# Patient Record
Sex: Female | Born: 1955
Health system: Southern US, Community
[De-identification: ages and names within clinical notes are randomized; demographics above are authoritative.]

## PROBLEM LIST (undated history)

## (undated) DIAGNOSIS — F32A Depression, unspecified: Secondary | ICD-10-CM

## (undated) DIAGNOSIS — G4733 Obstructive sleep apnea (adult) (pediatric): Secondary | ICD-10-CM

## (undated) DIAGNOSIS — F329 Major depressive disorder, single episode, unspecified: Secondary | ICD-10-CM

## (undated) DIAGNOSIS — I10 Essential (primary) hypertension: Secondary | ICD-10-CM

## (undated) DIAGNOSIS — Z9989 Dependence on other enabling machines and devices: Secondary | ICD-10-CM

## (undated) HISTORY — PX: JOINT REPLACEMENT: SHX530

## (undated) HISTORY — PX: SHOULDER SURGERY: SHX246

## (undated) HISTORY — DX: Major depressive disorder, single episode, unspecified: F32.9

## (undated) HISTORY — DX: Depression, unspecified: F32.A

## (undated) HISTORY — PX: TONSILLECTOMY: SUR1361

## (undated) HISTORY — DX: Obstructive sleep apnea (adult) (pediatric): G47.33

## (undated) HISTORY — DX: Dependence on other enabling machines and devices: Z99.89

---

## 1997-05-16 ENCOUNTER — Other Ambulatory Visit: Admission: RE | Admit: 1997-05-16 | Discharge: 1997-05-16 | Payer: Self-pay | Admitting: Obstetrics & Gynecology

## 1997-09-12 ENCOUNTER — Inpatient Hospital Stay (HOSPITAL_COMMUNITY): Admission: AD | Admit: 1997-09-12 | Discharge: 1997-09-12 | Payer: Self-pay | Admitting: Obstetrics and Gynecology

## 1997-10-14 ENCOUNTER — Other Ambulatory Visit: Admission: RE | Admit: 1997-10-14 | Discharge: 1997-10-14 | Payer: Self-pay | Admitting: Obstetrics & Gynecology

## 1998-01-18 HISTORY — PX: HYSTERECTOMY ABDOMINAL WITH SALPINGO-OOPHORECTOMY: SHX6792

## 1999-02-03 ENCOUNTER — Other Ambulatory Visit: Admission: RE | Admit: 1999-02-03 | Discharge: 1999-02-03 | Payer: Self-pay | Admitting: Obstetrics & Gynecology

## 2001-02-15 ENCOUNTER — Other Ambulatory Visit: Admission: RE | Admit: 2001-02-15 | Discharge: 2001-02-15 | Payer: Self-pay | Admitting: Obstetrics & Gynecology

## 2001-07-06 ENCOUNTER — Ambulatory Visit (HOSPITAL_COMMUNITY): Admission: RE | Admit: 2001-07-06 | Discharge: 2001-07-06 | Payer: Self-pay | Admitting: Obstetrics & Gynecology

## 2001-07-06 ENCOUNTER — Encounter (INDEPENDENT_AMBULATORY_CARE_PROVIDER_SITE_OTHER): Payer: Self-pay | Admitting: Specialist

## 2002-07-28 ENCOUNTER — Emergency Department (HOSPITAL_COMMUNITY): Admission: EM | Admit: 2002-07-28 | Discharge: 2002-07-28 | Payer: Self-pay | Admitting: Emergency Medicine

## 2002-10-18 ENCOUNTER — Other Ambulatory Visit: Admission: RE | Admit: 2002-10-18 | Discharge: 2002-10-18 | Payer: Self-pay | Admitting: Obstetrics & Gynecology

## 2003-04-25 ENCOUNTER — Encounter (INDEPENDENT_AMBULATORY_CARE_PROVIDER_SITE_OTHER): Payer: Self-pay | Admitting: Specialist

## 2003-04-26 ENCOUNTER — Inpatient Hospital Stay (HOSPITAL_COMMUNITY): Admission: RE | Admit: 2003-04-26 | Discharge: 2003-04-27 | Payer: Self-pay | Admitting: Obstetrics & Gynecology

## 2005-01-18 ENCOUNTER — Emergency Department (HOSPITAL_COMMUNITY): Admission: EM | Admit: 2005-01-18 | Discharge: 2005-01-19 | Payer: Self-pay | Admitting: Emergency Medicine

## 2005-04-13 ENCOUNTER — Emergency Department (HOSPITAL_COMMUNITY): Admission: EM | Admit: 2005-04-13 | Discharge: 2005-04-13 | Payer: Self-pay | Admitting: Emergency Medicine

## 2005-05-25 ENCOUNTER — Emergency Department (HOSPITAL_COMMUNITY): Admission: EM | Admit: 2005-05-25 | Discharge: 2005-05-25 | Payer: Self-pay | Admitting: Family Medicine

## 2005-10-26 ENCOUNTER — Emergency Department (HOSPITAL_COMMUNITY): Admission: EM | Admit: 2005-10-26 | Discharge: 2005-10-26 | Payer: Self-pay | Admitting: Emergency Medicine

## 2007-11-02 ENCOUNTER — Observation Stay (HOSPITAL_COMMUNITY): Admission: EM | Admit: 2007-11-02 | Discharge: 2007-11-03 | Payer: Self-pay | Admitting: Emergency Medicine

## 2010-06-02 NOTE — Discharge Summary (Signed)
April Stevens, April Stevens                  ACCOUNT NO.:  [id number]   MEDICAL RECORD NO.:  [id number]          PATIENT TYPE:  OBV   LOCATION:  3733                         FACILITY:  [facility]   PHYSICIAN:  Altha Harm, MDDATE OF BIRTH:  08/07/1955   DATE OF ADMISSION:  11/02/2007  DATE OF DISCHARGE:  11/03/2007                               DISCHARGE SUMMARY   DISCHARGE DISPOSITION:  Home.   FINAL DISCHARGE DIAGNOSIS:  1. Chest pain noncardiac.  2. Hyperlipidemia.  3. Hypertension well controlled.  4. History of depression.  5. Chronic pain.   DISCHARGE MEDICATIONS:  1. Lipitor 20 mg p.o. daily.  2. Amlodipine 5 mg p.o. daily.  3. Hydrochlorothiazide 25 mg p.o. daily.  4. Fluoxetine  20 mg p.o. daily.  5. Cyclobenzaprine 10 mg p.o. q.8 hours p.r.n.  6. Etodolac 400 mg p.o. q.12 hours p.r.n.  7. Aspirin 81 mg p.o. daily.   CONSULTANTS:  Cardiology.   PROCEDURES:  None.   DIAGNOSTIC STUDIES:  1. Cardiolite stress test which was negative for any irreversible wall      motion abnormalities.  2. Chest x-ray 2 view which showed minimal bronchitic changes.   CODE STATUS:  Full code.   ALLERGIES:  No known drug allergies.   CHIEF COMPLAINT:  Chest pain and numbness of the left upper extremity.   HISTORY OF PRESENT ILLNESS:  Please refer to the H and P dictated by Dr.  Harlin Heys for details of the H and P.   HOSPITAL COURSE:  The patient was admitted and serial enzymes performed  at rest.   These were all negative and the patient underwent a stress test which  was negative for any irreversible wall motion abnormalities.  The  patient was pain-free during her hospitalization.   At the time of discharge vital signs are stable.  Heart rate is 77,  blood pressure 121/73, temperature 97.7, respiratory rate 18, 02  saturations 99% on room air.  Sodium 144, potassium 3.2, chloride 107,  bicarb 30, BUN 13, creatinine 0.97, total cholesterol 190, triglycerides  251.  Lungs were  clear to auscultation.  Cardiovascular; she is normal  sinus rhythm on the monitor.  Normal S1 and S2.  No murmurs, rubs or  gallops noted.  Abdomen is obese, soft, nontender, not distended.  No  masses, no hepatosplenomegaly.  Extremities showed she had no clubbing,  cyanosis or edema.   FOLLOWUP:  The patient is to followup with Dr. Girard Cooter in [facility] in one week.   DIETARY RESTRICTIONS:  She should be on a low sodium, low cholesterol  diet.      Altha Harm, MD  Electronically Signed     MAM/MEDQ  D:  11/03/2007  T:  11/03/2007  Job:  [id number]

## 2010-06-02 NOTE — H&P (Signed)
April Stevens, FEAR NO.:  [id number]   MEDICAL RECORD NO.:  [id number]          PATIENT TYPE:  OBV   LOCATION:  3733                         FACILITY:  [facility]   PHYSICIAN:  Hillery Aldo, M.D.   DATE OF BIRTH:  1955-10-04   DATE OF ADMISSION:  11/02/2007  DATE OF DISCHARGE:                              HISTORY & PHYSICAL   PRIMARY CARE PHYSICIAN:  The patient sees multiple physicians at  [facility].   CHIEF COMPLAINT:  Chest pain, numbness of the left upper extremity.   HISTORY OF PRESENT ILLNESS:  The patient is a 55 year old female who  comes in with a chief complaint of chest pain of 2 days' duration that  was nonexertional when it began.  She describes it as sharp,  intermittent, and lasted only a few seconds at a time.  Pain is  occasionally associated with dyspnea and left arm numbness.  She denies  any associated fever, chills, diaphoresis, nausea, vomiting, or cough.  She had a similar episode several weeks ago.  The patient has a few  cardiac risk factors including hypertension, dyslipidemia, and  postmenopausal status as well as mild obesity and history of tobacco  abuse in the past and therefore is being admitted for acute coronary  syndrome rule out and to obtain an exercise stress test.   PAST MEDICAL HISTORY:  1. Dysmenorrhea/menorrhagia, status post hysterectomy approximately 5      years ago with bilateral salpingo-oophorectomy and lysis of      adhesions.  2. Hypertension.  3. Dyslipidemia.  4. Obstructive sleep apnea on nocturnal CPAP.  5. History of cold knife conization of cervix for CIN II.  6. History of bilateral tubal ligation prior to hysterectomy.  7. Depression.  8. History of panic attacks.   FAMILY HISTORY:  The patient's father died at 77.  He had a heart attack  at age 32 and ultimately died of heart failure at 52.  The patient is  estranged from her mother.  She has 4 sisters.  All of the siblings have  hypertension and dyslipidemia.  One sister has breast cancer.   SOCIAL HISTORY:  The patient is divorced and currently lives alone.  She  quit smoking 11 years ago, but prior to that had a heavy history of 1-  1/2 packs to 2 packs per day.  She occasionally drinks a glass of wine.  She denies any drug use.  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   ALLERGIES:  None.   CURRENT MEDICATIONS:  1. Lipitor 20 mg daily.  2. Amlodipine 5 mg daily.  3. Hydrochlorothiazide 25 mg daily.  4. Fluoxetine 20 mg daily.  5. Cyclobenzaprine 10 mg q.8 h. P.r.n.  6. Etodolac 400 mg q.12 h. p.r.n.   REVIEW OF SYSTEMS:  The patient endorses an intentional weight loss of  over 20 pounds.  She denies diarrhea.  She denies melena, hematochezia,  and dysuria.  A comprehensive 14-point review of systems is otherwise as  noted in the elements of the  HPI above.   PHYSICAL EXAM:  VITAL SIGNS:  Temperature 98.0, pulse 77, respirations  20, blood pressure 156/96, and O2 saturation 98% on room air.  GENERAL:  Mildly obese female in no acute distress.  HEENT:  Normocephalic, atraumatic.  PERRL.  EOMI.  Oropharynx is clear.  NECK:  Supple, no thyromegaly, no lymphadenopathy, no jugular venous  distention.  CHEST:  Lungs clear to auscultation bilaterally with good air movement.  HEART:  Regular rate, rhythm.  No murmurs, rubs, or gallops.  Palpation  of her anterior chest wall does cause reproducible chest pain in the  left ribcage area anteriorly.  ABDOMEN:  Soft, nontender, nondistended with normoactive bowel sounds.  EXTREMITIES:  No clubbing, edema, or cyanosis.  SKIN:  Warm and dry.  No rashes.  NEUROLOGIC:  The patient is alert and oriented x3.  Nonfocal exam.  Subjective paresthesias in the left upper extremity.   Data review.   Chest x-ray shows minimal bronchitic changes.   A 12-lead EKG shows normal sinus rhythm with prolonged QTc of 485  milliseconds, but is  otherwise without ST or T-wave abnormalities.   LABORATORY DATA:  Myoglobin is 160, CK-MB 9.2, troponin I less than  0.05.  White blood cell count is 8.4, hemoglobin 12.9, hematocrit 37.9,  platelets 385.  Sodium is 144, potassium 3.0, chloride 104, bicarb 29,  BUN 13, creatinine 1.0, glucose 91.   ASSESSMENT AND PLAN:  1. Atypical chest pain in a patient with several cardiac risk factors:      We will admit the patient for observation and cycle cardiac enzymes      q.6 h. x3 sets.  We will perform exercise stress testing in the      morning.  We will start her on low-dose aspirin therapy and      administer nitroglycerin if needed for recurrent chest pain along      with morphine if needed.  We will empirically put her on proton      pump inhibitor and monitor on the telemetry unit.  We will also      check a D-dimer to rule out pulmonary embolism and if this is      elevated, consider a CT angiogram.  2. Hypokalemia:  Likely secondary to renal losses from diuretic      therapy.  We will replete.  3. Hypertension:  Continue the patient's Norvasc and      hydrochlorothiazide.  Her blood pressure is slightly elevated here      in the emergency department and if the trend continues, we would      increase her Norvasc to 10 mg daily.  4. Dyslipidemia:  We will check the patient's fasting lipid panel in      the morning to ensure she is well controlled      on her current dose of Lipitor.  5. Prophylaxis:  Administer subcutaneous heparin for deep venous      thrombosis prophylaxis and place the patient on proton pump      inhibitor therapy for gastrointestinal stress ulcer prophylaxis.      Hillery Aldo, M.D.  Electronically Signed     CR/MEDQ  D:  11/02/2007  T:  11/02/2007  Job:  [id number]   cc:   Holley [place]

## 2010-06-05 NOTE — Op Note (Signed)
[facility]  Patient:    April Stevens, April Stevens Visit Number: [id number] MRN: [id number]          Service Type: DSU Location: [facility] Attending Physician:  Minette Headland Dictated by:   Freddy Finner, M.D. Proc. Date: 07/06/01 Admit Date:  07/06/2001                             Operative Report  PREOPERATIVE DIAGNOSES:       1. Endometrial wall thickening anteriorly on                                  sonohysterogram in office.                               2. Clinical symptoms of menometrorrhagia,                                  uterine enlargement.                               3. Ultrasound evidence of one single fibroid                                  and findings consistent with adenomyosis.  PREOPERATIVE DIAGNOSES:       1. Endometrial wall thickening anteriorly on                                  sonohysterogram in office.                               2. Clinical symptoms of menometrorrhagia,                                  uterine enlargement.                               3. Ultrasound evidence of one single fibroid                                  and findings consistent with adenomyosis.                               4. Finding of endometrial polyp.  PROCEDURE:                    Hysteroscopy dilation and curettage and resection and endometrial polyp.  SURGEON:                      Freddy Finner, M.D.  ANESTHESIA:                   General.  ESTIMATED BLOOD LOSS:         10  cc or less.  SORBITOL DEFICIT:             30 cc.  INTRAOPERATIVE COMPLICATIONS: None.  INDICATIONS FOR PROCEDURE:    The patient is a 55 year old with menometrorrhagia who had sonohysterogram on June 26, 2001 in the office with a finding of an endometrial thickening anteriorly and a 19 mm intramural leiomyoma. She is admitted now for hysteroscopy D&C.  DESCRIPTION OF PROCEDURE:  She was admitted on the morning of surgery. She was brought to the operating  room, there placed under adequate general anesthesia, placed in the dorsal lithotomy position using Allen stirrup system. A Betadine prep was carried out in the usual fashion. A bivalve speculum was introduced. The cervix was grasped with a single tooth tenaculum and progressively dilated with Pratts to 23. The 12 1/2 degree [facility] hysteroscope was introduced using 3% sorbitol as the distending medium. Photographs were made of intracavitary findings, specifically a polyp on the anterior fundal surface of the endometrium. Thorough curettage and exploration with polyp forceps was carried out followed by repeat inspection which confirmed sampling of the endometrium and removal of the polyp. At this point, the procedure was terminated, the instruments removed, the patient was awakened and taken to recovery in good condition. Dictated by:   Freddy Finner, M.D. Attending Physician:  Minette Headland DD:  07/06/01 TD:  07/06/01 Job: [id number]

## 2010-06-05 NOTE — Discharge Summary (Signed)
NAMEELMARIE, April Stevens                            ACCOUNT NO.:  [id number]   MEDICAL RECORD NO.:  [id number]                   PATIENT TYPE:  INP   LOCATION:  9302                                 [facility]ACILITY[facility]  [facility]   PHYSICIAN:  Freddy Finner, M.D.                DATE OF BIRTH:  May 09, 1955   DATE OF ADMISSION:  04/25/2003  DATE OF DISCHARGE:  04/27/2003                                 DISCHARGE SUMMARY   DISCHARGE DIAGNOSES:  1. Uterine leiomyomata.  2. Pelvic adhesions.  3. History of chronic dysmenorrhea and menorrhagia.   OPERATIVE PROCEDURE:  Laparoscopic-assisted vaginal hysterectomy, bilateral  salpingo-oophorectomy, lysis of pelvic adhesions.   INTRAOPERATIVE COMPLICATIONS:  None.   POSTOPERATIVE COMPLICATIONS:  Slow return of GI function without other  complications.   Physical findings on admission were primarily remarkable for the pelvic  findings with enlargement of the uterus consistent with leiomyomata.   Laboratory data on admission initially included a CBC on admission with  hemoglobin of 11.5, postoperative hemoglobin of 10.6.  Prothrombin time and  PTT were normal on admission.   HOSPITAL COURSE:  The patient was admitted on the morning of surgery.  The  above-described surgical procedure was accomplished without intraoperative  complications.  Postoperatively the patient's course was satisfactory but on  the day following surgery she was having a difficult time tolerating oral  intake with persistent nausea.  It was elected to continue her admission for  another day which allowed adequate progression of diet and by the time of  her discharge on postoperative day #2 she was tolerating a regular diet,  ambulating without difficulty, having adequate bowel and bladder function.  Her discharge medications are Vivelle 0.1 dot and Percocet as needed for  pain.  She is to return to the office in 2 weeks for postoperative follow-  up.               W. Varney Baas, M.D.    WRN/MEDQ  D:  05/24/2003  T:  05/24/2003  Job:  [id number]

## 2010-06-05 NOTE — Op Note (Signed)
NAMESHRESTA, RISDEN                            ACCOUNT NO.:  [id number]   MEDICAL RECORD NO.:  [id number]                   PATIENT TYPE:  OBV   LOCATION:  9399                                 [facility]ACILITY[facility]  [facility]   PHYSICIAN:  Freddy Finner, M.D.                DATE OF BIRTH:  [birth year]   DATE OF PROCEDURE:  04/25/2003  DATE OF DISCHARGE:                                 OPERATIVE REPORT   PREOPERATIVE DIAGNOSES:  1. Uterine enlargement.  2. Uterine fibroids.  3. Complex right adnexal cyst suggestive of endometrioma.  4. History of severe dysmenorrhea/menometrorrhagia.   POSTOPERATIVE DIAGNOSES:  1. Uterine enlargement.  2. Uterine fibroids.  3. Complex right adnexal cyst suggestive of endometrioma.  4. History of severe dysmenorrhea/menometrorrhagia.  5. Benign-appearing right ovarian cyst and pelvic adhesions of the left     adnexa.   OPERATIVE PROCEDURE:  Laparoscopically assisted vaginal hysterectomy,  bilateral salpingo-oophorectomy, lysis of pelvic adhesions.   ESTIMATED INTRAOPERATIVE BLOOD LOSS:  100 mL.   ANESTHESIA:  General.   INTRAOPERATIVE COMPLICATIONS:  None.   SURGEON:  Freddy Finner, M.D.   ASSISTANT:  Dineen Kid. Rana Snare, M.D.   Details of the present illness are recorded in the admission note.  Patient  was admitted on the morning of surgery, she received an antibiotic IV  preoperatively, she was placed in PAS hose, she was brought to the operating  room and there placed under adequate general anesthesia and placed in the  dorsal lithotomy position.  Prep of the abdomen, perineum, and vagina was  carried out with Betadine scrub followed by Betadine solution.  Bladder was  evacuated using the [facility] catheter.  Hook tenaculum was attached to the  cervix without difficulty.  Sterile drapes were applied.  Two small  incisions were made, one at the umbilicus through an old scar and one just  above the symphysis in the midline.  An 11 mm 9-blade disposable trocar  was  introduced in the umbilicus while elevating the anterior abdominal wall  manually, direct inspection revealed adequate placement with no evidence of  injury on entry, pneumoperitoneum was allowed to accumulate with carbon  dioxide gas, a 5 mm trocar was placed in the lower incision under direct  visualization, scanning inspection of the upper abdomen revealed no apparent  abnormalities of liver, of appendix, no upper abdominal adhesions or  apparent abnormalities.  Pelvic findings are noted as postoperative  diagnoses and recorded in still photographs which are taped in the office  record.  Through the lower trocar sleeve spring-loaded grasping forceps was  placed.  The right infundibular caudate ligament was addressed first and the  gyrus tripolar device was used to seal and divide that pedicle.  There was  some bleeding initially from this which was controlled with the bipolar.  This required additional attention because a small hematoma formed and  seemed to be extending.  After several different applications of the cautery  device the hematoma stabilized and remained stable throughout the case  including at inspection immediately before completing the case.  The round  ligament, upper broad ligament on this side were taken with the tripolar  gyrus device down to a level just above the uterine arteries.  On the left  side there were some paraovarian adhesions and an adhesion of the sigmoid  epiploica to the left fallopian tube just at the isthmus.  This adhesion was  taken down with the gyrus device which was sealed  and divided.  Blunt  dissection was required to elevate the ovary and free it on the left side.  The infundibular pelvic ligament, round ligament and upper broad ligament  were then controlled similarly to the right side with sealing and division  using the gyrus device, this too was carried down to a level just above the  uterine artery.  Attention was then turned  vaginally, a posterior weighted  retractor was placed, __________ were used anteriorly and laterally for  vaginal retraction.  Hook tenaculum was removed and Jacobs tenaculum  applied, posterior colpotomy incision was made while pinning the mucosa  posterior to the cervix with an Allis, cervix was circumscribed with a  scalpel, the ligature system was then used to seal and divide the  uterosacral pedicles, the bladder pillars and the lower cardinal ligaments.  The bladder was carefully dissected off the cervix and lower segment and  anterior peritoneum entered.  The upper cardinal ligament was taken and  sealed with the LigaSure system and divided.  The vessels were taken,  sealed, and divided.  A small pedicle above each vessel was taken.  This  effectively freed the uterus and it was delivered through the vaginal  introitus.  __________ of the vagina were then attached to the uterosacral  ligaments with a mattress suture 0 Monocryl.  Uterosacral's were plicated,  posterior peritoneum closed with an interrupted 0 Monocryl suture.  Cuff was  closed vertically with figure of eights and Monocryl.  Foley was placed.  Reinspection with the laparoscope abdominally using __________ irrigation  system was carried out.  Hemostasis was completed with a stable small  hematoma at the right infundibular pelvic ligament.  The irrigating solution  was aspirated from the abdomen, instruments were removed, skin incisions  were closed with interrupted subcuticular sutures and 3-0 Dexon, Steri-  Strips were applied in the lower incision, the patient was awakened and  taken to recovery in good condition.                                               Freddy Finner, M.D.    WRN/MEDQ  D:  04/25/2003  T:  04/25/2003  Job:  [id number]

## 2010-06-05 NOTE — H&P (Signed)
NAME:  April Stevens, April Stevens                            ACCOUNT NO.:  [id number]   MEDICAL RECORD NO.:  [id number]                   PATIENT TYPE:  OBV   LOCATION:  NA                                   FACILITY[facility]   PHYSICIAN:  Freddy Finner, M.D.                DATE OF BIRTH:  10-04-1955   DATE OF ADMISSION:  04/25/2003  DATE OF DISCHARGE:                                HISTORY & PHYSICAL   ADMISSION DIAGNOSES:  Uterine fibroids, complex right ovarian cyst most  consistent with endometrioma, and probable adenomyosis.   HISTORY OF PRESENT ILLNESS:  The patient is a 55 year old gravida 2, para 2  with several years history of menometrorrhagia, known uterine enlargement  with known uterine leiomyomata. She has had significant dysmenorrhea and  pelvic pain over the last 3 to 4 months since the complex right ovarian cyst  was first noted. The cyst and fibroids have been confirmed by pelvic  ultrasound, the most recent on April 04, 2003. She is now admitted for  laparoscopically assisted vaginal hysterectomy and bilateral salpingo-  oophorectomy.   PAST MEDICAL HISTORY:  The patient has no known significant medical  illnesses except for episodes of hypertension noted at our office, which she  has failed to follow up with her internist.   MEDICATIONS:  She is currently on no medications on a chronic basis except  Darvocet for pain.   PAST SURGICAL HISTORY:  Cold knife conization of the cervix for CIN-II in  1983, __________. She also had tubal sterilization in 1997. She had  hysteroscopy D&C for endometrorrhagia in 2003.   ALLERGIES:  No known drug allergies.   SOCIAL HISTORY:  She is not a cigarette smoker.   LABORATORY DATA:  Mammogram in September of 2004 was normal. Recent pap  smear in September of 2004 was normal.   REVIEW OF SYSTEMS:  Negative except for GU complaints. No cardiopulmonary,  GI or other GU symptoms.   FAMILY HISTORY:  Noncontributory.   PHYSICAL EXAMINATION:   HEENT:  Grossly within normal limits.  VITAL SIGNS:  Blood pressure in the office was 170/84.  NECK:  Thyroid gland is not palpable or enlarged.  CHEST:  Clear to auscultation throughout.  HEART:  Normal sinus rhythm. Without murmur, rub, or gallop.  ABDOMEN:  Soft and nontender without appreciable organomegaly or palpable  masses.  BREAST:  Examination is normal. No skin change. No nipple discharge. No  palpable masses.  PELVIC:  External genitalia and cervix are normal. Bimanual reveals the  uterus to be approximately 8 weeks size by palpation. There is tenderness in  the right adnexa. The mass is difficult to palpate but is confirmed by  ultrasound. Left adnexa is clear. Rectovaginal examination confirms the  above findings.  EXTREMITIES:  Without clubbing, cyanosis, or edema.   ASSESSMENT:  Uterine leiomyomata. Suspected adenomyosis. History of  menometrorrhagia. Failure to respond to  conservative surgery. Complex right  adnexal mass.   PLAN:  Laparoscopically assisted vaginal hysterectomy and bilateral salpingo-  oophorectomy.                                               Freddy Finner, M.D.    WRN/MEDQ  D:  04/24/2003  T:  04/24/2003  Job:  [id number]

## 2010-10-20 LAB — LIPID PANEL
LDL Cholesterol: 102 — ABNORMAL HIGH
Total CHOL/HDL Ratio: 5
Triglycerides: 251 — ABNORMAL HIGH
VLDL: 50 — ABNORMAL HIGH

## 2010-10-20 LAB — POCT CARDIAC MARKERS
CKMB, poc: 6.2
CKMB, poc: 9.2
Myoglobin, poc: 163
Troponin i, poc: <0.05
Troponin i, poc: <0.05

## 2010-10-20 LAB — BASIC METABOLIC PANEL
CO2: 30
Calcium: 8.8
Chloride: 107
GFR calc Af Amer: >60
Glucose, Bld: 102 — ABNORMAL HIGH
Sodium: 144

## 2010-10-20 LAB — CBC
HCT: 37.9
Hemoglobin: 12.9
MCHC: 34.1
MCV: 89.6
Platelets: 385
RBC: 4.23
RDW: 13.7
WBC: 8.4

## 2010-10-20 LAB — CK TOTAL AND CKMB (NOT AT ARMC)
CK, MB: 6.8 — ABNORMAL HIGH
Relative Index: 2.1
Total CK: 328 — ABNORMAL HIGH

## 2010-10-20 LAB — D-DIMER, QUANTITATIVE: D-Dimer, Quant: 0.68 — ABNORMAL HIGH

## 2010-10-20 LAB — DIFFERENTIAL
Eosinophils Absolute: 0.1
Eosinophils Relative: 2
Lymphocytes Relative: 35
Lymphs Abs: 3
Monocytes Relative: 6

## 2010-10-20 LAB — POCT I-STAT, CHEM 8
BUN: 13
Creatinine, Ser: 1
Glucose, Bld: 91
Hemoglobin: 12.6
TCO2: 29

## 2010-10-20 LAB — TSH: TSH: 2.164

## 2011-05-02 ENCOUNTER — Other Ambulatory Visit: Payer: Self-pay | Admitting: Family Medicine

## 2011-05-03 NOTE — Telephone Encounter (Signed)
Needs ov

## 2011-07-07 ENCOUNTER — Other Ambulatory Visit: Payer: Self-pay | Admitting: Physician Assistant

## 2011-07-29 ENCOUNTER — Ambulatory Visit (INDEPENDENT_AMBULATORY_CARE_PROVIDER_SITE_OTHER): Payer: Medicare HMO | Admitting: Physician Assistant

## 2011-07-29 VITALS — BP 140/68 | HR 81 | Temp 98.6°F | Resp 16 | Ht 61.25 in | Wt 162.4 lb

## 2011-07-29 DIAGNOSIS — F3289 Other specified depressive episodes: Secondary | ICD-10-CM

## 2011-07-29 DIAGNOSIS — I10 Essential (primary) hypertension: Secondary | ICD-10-CM

## 2011-07-29 DIAGNOSIS — F329 Major depressive disorder, single episode, unspecified: Secondary | ICD-10-CM

## 2011-07-29 DIAGNOSIS — F32A Depression, unspecified: Secondary | ICD-10-CM

## 2011-07-29 LAB — POCT CBC
Granulocyte percent: 62.3 %G (ref 37–80)
HCT, POC: 42.1 % (ref 37.7–47.9)
Hemoglobin: 13.9 g/dL (ref 12.2–16.2)
Lymph, poc: 2.4 (ref 0.6–3.4)
MCH, POC: 30.3 pg (ref 27–31.2)
MCHC: 33 g/dL (ref 31.8–35.4)
MCV: 91.8 fL (ref 80–97)
MID (cbc): 0.5 (ref 0–0.9)
MPV: 7.8 fL (ref 0–99.8)
POC Granulocyte: 4.7 (ref 2–6.9)
POC LYMPH PERCENT: 31 %L (ref 10–50)
POC MID %: 6.7 %M (ref 0–12)
Platelet Count, POC: 394 10*3/uL (ref 142–424)
RBC: 4.59 M/uL (ref 4.04–5.48)
RDW, POC: 13.8 %
WBC: 7.6 10*3/uL (ref 4.6–10.2)

## 2011-07-29 MED ORDER — FLUOXETINE HCL 20 MG PO CAPS: 20.0000 mg | ORAL_CAPSULE | Freq: Every day | ORAL | Status: DC

## 2011-07-29 MED ORDER — CHLORTHALIDONE 25 MG PO TABS: 25.0000 mg | ORAL_TABLET | Freq: Every day | ORAL | Status: DC

## 2011-07-29 NOTE — Progress Notes (Signed)
  Subjective:    Patient ID: April Stevens, female    DOB: 17-Dec-1955, 56 y.o.   MRN: [id number]  HPI Patient presents for refill of medications. She is taking Chlorthalidone for blood pressure and Prozac for depression. Both conditions stable and she is tolerating medications well. She has been out of her BP medicine for 2 days and so blood pressure elevated today. Normally well controlled (120-130/80).  She has not had bloodwork in about a year.  Denies chest pain, SOB, or vision changes.     Review of Systems  All other systems reviewed and are negative.       Objective:   Physical Exam  Constitutional: She is oriented to person, place, and time. She appears well-developed and well-nourished.  HENT:  Head: Normocephalic and atraumatic.  Right Ear: Hearing, tympanic membrane, external ear and ear canal normal.  Left Ear: Hearing, tympanic membrane, external ear and ear canal normal.  Mouth/Throat: Uvula is midline, oropharynx is clear and moist and mucous membranes are normal. No oropharyngeal exudate.  Eyes: Conjunctivae are normal.  Neck: Normal range of motion.  Cardiovascular: Normal rate, regular rhythm and normal heart sounds.   Pulmonary/Chest: Effort normal and breath sounds normal.  Neurological: She is alert and oriented to person, place, and time.  Psychiatric: She has a normal mood and affect. Her behavior is normal. Judgment and thought content normal.          Assessment & Plan:   1. Hypertension  Stable. Recommend complete physical in 6 months for recheck and further labwork She will make an appointment at 104.  POCT CBC, Comprehensive metabolic panel, Lipid panel, chlorthalidone (HYGROTON) 25 MG tablet  2. Depression - stable Continue current treatment plan.  FLUoxetine (PROZAC) 20 MG capsule

## 2011-07-29 NOTE — Patient Instructions (Signed)
Recommend complete physical in 6 months Continue current medications

## 2011-07-30 LAB — COMPREHENSIVE METABOLIC PANEL
ALT: 12 U/L (ref 0–35)
AST: 16 U/L (ref 0–37)
Albumin: 4.1 g/dL (ref 3.5–5.2)
Alkaline Phosphatase: 79 U/L (ref 39–117)
BUN: 18 mg/dL (ref 6–23)
CO2: 31 mEq/L (ref 19–32)
Calcium: 9.1 mg/dL (ref 8.4–10.5)
Chloride: 101 mEq/L (ref 96–112)
Creat: 0.76 mg/dL (ref 0.50–1.10)
Glucose, Bld: 97 mg/dL (ref 70–99)
Potassium: 2.9 mEq/L — ABNORMAL LOW (ref 3.5–5.3)
Sodium: 141 mEq/L (ref 135–145)
Total Bilirubin: 0.7 mg/dL (ref 0.3–1.2)
Total Protein: 7 g/dL (ref 6.0–8.3)

## 2011-07-30 LAB — LIPID PANEL
Cholesterol: 248 mg/dL — ABNORMAL HIGH (ref 0–200)
HDL: 50 mg/dL (ref >39)
LDL Cholesterol: 145 mg/dL — ABNORMAL HIGH (ref 0–99)
Total CHOL/HDL Ratio: 5 Ratio
Triglycerides: 267 mg/dL — ABNORMAL HIGH (ref <150)
VLDL: 53 mg/dL — ABNORMAL HIGH (ref 0–40)

## 2011-12-22 ENCOUNTER — Telehealth: Payer: Self-pay

## 2011-12-22 NOTE — Telephone Encounter (Signed)
Labs faxed thru Epic. °

## 2011-12-22 NOTE — Telephone Encounter (Signed)
Nettie Elm from the [facility] is requesting pt's recent cbc or bmp, she states that she needs them today because the patient has surgery in morning. [facility]# [phone] ext [phone] Fax: [phone]

## 2012-01-22 ENCOUNTER — Other Ambulatory Visit: Payer: Self-pay | Admitting: Physician Assistant

## 2012-02-23 ENCOUNTER — Telehealth: Payer: Self-pay

## 2012-02-23 ENCOUNTER — Ambulatory Visit: Payer: Medicare HMO

## 2012-02-23 ENCOUNTER — Ambulatory Visit (INDEPENDENT_AMBULATORY_CARE_PROVIDER_SITE_OTHER): Payer: Medicare HMO | Admitting: Family Medicine

## 2012-02-23 VITALS — BP 146/91 | HR 99 | Temp 98.8°F | Resp 16 | Ht 62.0 in | Wt 155.0 lb

## 2012-02-23 DIAGNOSIS — F411 Generalized anxiety disorder: Secondary | ICD-10-CM

## 2012-02-23 DIAGNOSIS — S8990XA Unspecified injury of unspecified lower leg, initial encounter: Secondary | ICD-10-CM

## 2012-02-23 DIAGNOSIS — Z23 Encounter for immunization: Secondary | ICD-10-CM

## 2012-02-23 DIAGNOSIS — S99919A Unspecified injury of unspecified ankle, initial encounter: Secondary | ICD-10-CM

## 2012-02-23 DIAGNOSIS — F329 Major depressive disorder, single episode, unspecified: Secondary | ICD-10-CM

## 2012-02-23 DIAGNOSIS — M25569 Pain in unspecified knee: Secondary | ICD-10-CM

## 2012-02-23 DIAGNOSIS — I10 Essential (primary) hypertension: Secondary | ICD-10-CM | POA: Insufficient documentation

## 2012-02-23 DIAGNOSIS — F3289 Other specified depressive episodes: Secondary | ICD-10-CM

## 2012-02-23 DIAGNOSIS — F32A Depression, unspecified: Secondary | ICD-10-CM

## 2012-02-23 DIAGNOSIS — F419 Anxiety disorder, unspecified: Secondary | ICD-10-CM

## 2012-02-23 LAB — BASIC METABOLIC PANEL
BUN: 11 mg/dL (ref 6–23)
CO2: 26 mEq/L (ref 19–32)
Calcium: 9.6 mg/dL (ref 8.4–10.5)
Glucose, Bld: 79 mg/dL (ref 70–99)
Sodium: 141 mEq/L (ref 135–145)

## 2012-02-23 MED ORDER — HYDROCODONE-ACETAMINOPHEN 5-325 MG PO TABS: 1.0000 | ORAL_TABLET | Freq: Three times a day (TID) | ORAL | Status: DC | PRN

## 2012-02-23 MED ORDER — HYDROCODONE-ACETAMINOPHEN 5-500 MG PO TABS: 1.0000 | ORAL_TABLET | Freq: Three times a day (TID) | ORAL | Status: DC | PRN

## 2012-02-23 MED ORDER — CHLORTHALIDONE 25 MG PO TABS
25.0000 mg | ORAL_TABLET | Freq: Every day | ORAL | Status: DC
Start: 2012-02-23 — End: 2012-09-25

## 2012-02-23 MED ORDER — ALPRAZOLAM 0.25 MG PO TABS: 0.2500 mg | ORAL_TABLET | Freq: Two times a day (BID) | ORAL | Status: DC | PRN

## 2012-02-23 MED ORDER — FLUOXETINE HCL 20 MG PO CAPS: 20.0000 mg | ORAL_CAPSULE | Freq: Every day | ORAL | Status: DC

## 2012-02-23 NOTE — Telephone Encounter (Signed)
[facility]- [phone]. PHARMACY CALLED TO VERIFY VICODIN STRENGTH BECAUSE, [facility] NOT [facility] THE 05/500 MG STRENGTH. THEY NEED TO ASK FOR A VERBAL AND TO VERIFY WITH THE DR. THAT WROTE THAT. THANK YOU!

## 2012-02-23 NOTE — Patient Instructions (Addendum)
Elevate and ice your knee and leg, and wear your knee brace when you are up and around. Get a cane or walker to help you stay off of your leg.    IF you develop worsening or severe pain/ pressure in the calf, or any numbness/ tingling of the foot you could be developing compartment syndrome (which we discussed at clinic).  If this occurs please seek care right away at the ER.    You may use xanax as needed for panic- minimize use to avoid habit formation.  You can use the vicodin as needed for knee pain.  Do not combine this with the xanax.  Do not use the vicodin to cove up pain that is becoming more severe- seek help as above    Let's check you in 2 days to see how you are doing.  Please come in on Friday morning early, tomorrow if you have any suspicion of getting worse
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
## 2012-02-23 NOTE — Telephone Encounter (Signed)
Called pharmacy back and authorized change to the Vicodin 5/325 since the 5/500 is no longer available. Changing Rx in EPIC

## 2012-02-24 ENCOUNTER — Telehealth: Payer: Self-pay

## 2012-02-24 NOTE — Telephone Encounter (Signed)
I called patient to advise her the bruising is expected into her lower leg/ she states her foot is warm and feels okay. I have advised her also to follow up as directed in clinic. She is concerned Dr Patsy Lager is not here, and no one will know what she is being seen for. I advised her everything is well documented in her chart and she should return as directed and any of our providers can help her. Langston [place] FYI

## 2012-02-24 NOTE — Telephone Encounter (Signed)
Called but had to [facility]- talked with Amy and it sounds like her main concern is bruising spreading down her leg which is to be expected.  However, if she is having any more pain please come in right away.  Also, I wonder if she might be willing to change to a different BP medication.  Her K is still low and the chlorthaidone is a likely cause.  We could try changing her to norvasc or lisinopril. I will call her back to discuss this

## 2012-02-24 NOTE — Telephone Encounter (Signed)
Dr. Patsy Lager,   Patient is seeing a change in her condition.  The bruising from the knee injury is going down to her foot.   CBN:  [phone]

## 2012-02-25 ENCOUNTER — Ambulatory Visit: Payer: Medicare HMO

## 2012-02-25 ENCOUNTER — Encounter: Payer: Self-pay | Admitting: Family Medicine

## 2012-02-25 MED ORDER — LISINOPRIL 10 MG PO TABS: 10.0000 mg | ORAL_TABLET | Freq: Every day | ORAL | Status: DC

## 2012-02-25 NOTE — Addendum Note (Signed)
Addended by: Abbe Amsterdam C on: 02/25/2012 02:48 PM   Modules accepted: Orders

## 2012-02-25 NOTE — Progress Notes (Signed)
02/25/12- Called to check on her and discuss BMP.  Her K is low and has been for a long time- she has been on chlorthalidone for a long time too.  Will change to lisinopril- she has a home BP cuff and will let me know how her BP responds.  Plan to recheck her BMP in 2 or 3 weeks- left future lab order on chart.    She also stated that she came in this morning to recheck her knee.  However, she was upset that she would have to pay her co-pay again so she left.  I explained that except for procedures such as stitches, follow- up visits do generally require a co- pay.  She understood and stated that her knee is overall getting better.

## 2012-03-07 ENCOUNTER — Ambulatory Visit (HOSPITAL_COMMUNITY)
Admission: RE | Admit: 2012-03-07 | Discharge: 2012-03-07 | Disposition: A | Discharge status: Home / Self Care | Payer: Private Health Insurance - Indemnity | Source: Ambulatory Visit | Attending: Family Medicine | Admitting: Family Medicine

## 2012-03-07 ENCOUNTER — Telehealth: Payer: Self-pay

## 2012-03-07 ENCOUNTER — Ambulatory Visit (INDEPENDENT_AMBULATORY_CARE_PROVIDER_SITE_OTHER): Payer: Medicare HMO | Admitting: Family Medicine

## 2012-03-07 VITALS — BP 150/90 | HR 80 | Temp 98.0°F | Resp 18 | Wt 183.0 lb

## 2012-03-07 DIAGNOSIS — M7989 Other specified soft tissue disorders: Secondary | ICD-10-CM

## 2012-03-07 DIAGNOSIS — M79609 Pain in unspecified limb: Secondary | ICD-10-CM

## 2012-03-07 DIAGNOSIS — M79605 Pain in left leg: Secondary | ICD-10-CM

## 2012-03-07 NOTE — Progress Notes (Signed)
Left Lower ext venous duplex completed. Negative for DVT. April Stevens

## 2012-03-07 NOTE — Telephone Encounter (Signed)
Spoke w/pt and gave her instr's from Dr Alwyn Ren. Pt agreed. She stated she has already been elevating and icing quite a bit, and has been taking ibuprofen. I advised pt that she may take 4 tabs of ibuprofen Q 8 hrs as a max dose. Pt has only been taking 3 Q 8 hrs. She stated that the doppler was very painful and requests a RF of the Vicodin that Dr Patsy Lager had Rxd if possible so that she can sleep at night because the throbbing keeps her awake. I advised her that I will send the request to Dr Alwyn Ren, but that he may not get the message until he RTC tomorrow morning. Pt voiced understanding.

## 2012-03-07 NOTE — Patient Instructions (Signed)
Get the venous ultrasound.  Have the ultrasound office call us while you're still there so we can decide on treatment.
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
## 2012-03-07 NOTE — Telephone Encounter (Signed)
Received a call report on pt's doppler, which was negative for DVT. Notified Dr Alwyn Ren.  Per Dr Alwyn Ren, pt needs to stay off of leg/rest as much as possible for the next 3-4 days and take antiinflammatory meds. If pt is not significantly improved in 3-4 days, pt should RTC for recheck. Also D/W pt elevating leg and icing to also help w/inflammation.  LMOM for pt to CB for instr's from Dr Alwyn Ren.

## 2012-03-07 NOTE — Telephone Encounter (Signed)
Pt states she just saw the provider that dr hopper referred her to and that she does not have any blood clots but would like to talk with someone about some pain meds due to the pain from the procedures

## 2012-03-08 NOTE — Telephone Encounter (Signed)
Call patient.  If the leg doesn't improve over 4-5 days advise another recheck.

## 2012-03-09 NOTE — Telephone Encounter (Signed)
Okay to her prescribed Lortab 5 mg #20 one every 6 hours as needed for severe pain only.  Tell the patient she needs to return for followup if continued to have pain and swelling. We will not be able to keep her on pain medications long-term.

## 2012-03-09 NOTE — Telephone Encounter (Signed)
Patient is requesting Rx for Hydrocodone, please advise.

## 2012-03-10 MED ORDER — HYDROCODONE-ACETAMINOPHEN 5-325 MG PO TABS: 1.0000 | ORAL_TABLET | Freq: Three times a day (TID) | ORAL | Status: DC | PRN

## 2012-03-10 NOTE — Telephone Encounter (Signed)
I have advised patient and I have called this in.

## 2012-03-27 ENCOUNTER — Telehealth: Payer: Self-pay

## 2012-03-27 DIAGNOSIS — F419 Anxiety disorder, unspecified: Secondary | ICD-10-CM

## 2012-03-27 MED ORDER — ALPRAZOLAM 0.25 MG PO TABS: 0.2500 mg | ORAL_TABLET | Freq: Two times a day (BID) | ORAL | Status: DC | PRN

## 2012-03-27 NOTE — Telephone Encounter (Signed)
Patient would like to speak with dr. Patsy Lager pertaining to her alprazolam 25 mg  [phone]

## 2012-03-27 NOTE — Telephone Encounter (Signed)
Called her left message for her to call back.

## 2012-03-27 NOTE — Telephone Encounter (Signed)
She is asking for a renewal of her Alprazolam sent to [facility] her power is out. Pended same as previous.

## 2012-03-27 NOTE — Telephone Encounter (Signed)
Started on xanax 0.25 at her visit on 02/23/12, this is the first RF she has requested.  Will do this for her.  Also  ask her to please come and see [place] for her potassium recheck-this is on her chart

## 2012-04-17 ENCOUNTER — Encounter (HOSPITAL_COMMUNITY): Payer: Self-pay | Admitting: Emergency Medicine

## 2012-04-17 ENCOUNTER — Emergency Department (HOSPITAL_COMMUNITY)
Admission: EM | Admit: 2012-04-17 | Discharge: 2012-04-18 | Disposition: A | Discharge status: Home / Self Care | Payer: Worker's Compensation | Attending: Emergency Medicine | Admitting: Emergency Medicine

## 2012-04-17 ENCOUNTER — Emergency Department (HOSPITAL_COMMUNITY): Payer: Worker's Compensation

## 2012-04-17 ENCOUNTER — Emergency Department (INDEPENDENT_AMBULATORY_CARE_PROVIDER_SITE_OTHER)
Admission: EM | Admit: 2012-04-17 | Discharge: 2012-04-17 | Disposition: A | Discharge status: Other Acute Inpatient Hospital | Payer: Worker's Compensation | Source: Home / Self Care | Attending: Emergency Medicine | Admitting: Emergency Medicine

## 2012-04-17 DIAGNOSIS — Y9301 Activity, walking, marching and hiking: Secondary | ICD-10-CM | POA: Insufficient documentation

## 2012-04-17 DIAGNOSIS — R42 Dizziness and giddiness: Secondary | ICD-10-CM | POA: Insufficient documentation

## 2012-04-17 DIAGNOSIS — F329 Major depressive disorder, single episode, unspecified: Secondary | ICD-10-CM | POA: Insufficient documentation

## 2012-04-17 DIAGNOSIS — Y990 Civilian activity done for income or pay: Secondary | ICD-10-CM | POA: Insufficient documentation

## 2012-04-17 DIAGNOSIS — W1809XA Striking against other object with subsequent fall, initial encounter: Secondary | ICD-10-CM | POA: Insufficient documentation

## 2012-04-17 DIAGNOSIS — W010XXA Fall on same level from slipping, tripping and stumbling without subsequent striking against object, initial encounter: Secondary | ICD-10-CM | POA: Insufficient documentation

## 2012-04-17 DIAGNOSIS — R110 Nausea: Secondary | ICD-10-CM | POA: Insufficient documentation

## 2012-04-17 DIAGNOSIS — I10 Essential (primary) hypertension: Secondary | ICD-10-CM | POA: Insufficient documentation

## 2012-04-17 DIAGNOSIS — F3289 Other specified depressive episodes: Secondary | ICD-10-CM | POA: Insufficient documentation

## 2012-04-17 DIAGNOSIS — S060X9A Concussion with loss of consciousness of unspecified duration, initial encounter: Secondary | ICD-10-CM

## 2012-04-17 DIAGNOSIS — Z79899 Other long term (current) drug therapy: Secondary | ICD-10-CM | POA: Insufficient documentation

## 2012-04-17 DIAGNOSIS — Y9289 Other specified places as the place of occurrence of the external cause: Secondary | ICD-10-CM | POA: Insufficient documentation

## 2012-04-17 DIAGNOSIS — I6789 Other cerebrovascular disease: Secondary | ICD-10-CM | POA: Insufficient documentation

## 2012-04-17 DIAGNOSIS — S069X9A Unspecified intracranial injury with loss of consciousness of unspecified duration, initial encounter: Secondary | ICD-10-CM

## 2012-04-17 DIAGNOSIS — S0990XA Unspecified injury of head, initial encounter: Secondary | ICD-10-CM | POA: Insufficient documentation

## 2012-04-17 HISTORY — DX: Essential (primary) hypertension: I10

## 2012-04-17 LAB — BASIC METABOLIC PANEL
CO2: 22 mEq/L (ref 19–32)
Calcium: 9.6 mg/dL (ref 8.4–10.5)
GFR calc non Af Amer: 72 mL/min — ABNORMAL LOW (ref >90)
Sodium: 140 mEq/L (ref 135–145)

## 2012-04-17 LAB — CBC
MCH: 30.2 pg (ref 26.0–34.0)
Platelets: 324 10*3/uL (ref 150–400)
RBC: 4.44 MIL/uL (ref 3.87–5.11)

## 2012-04-17 MED ORDER — ONDANSETRON 4 MG PO TBDP: 4.0000 mg | ORAL_TABLET | Freq: Three times a day (TID) | ORAL | Status: DC | PRN

## 2012-04-17 MED ORDER — HYDROCODONE-ACETAMINOPHEN 5-325 MG PO TABS: 1.0000 | ORAL_TABLET | Freq: Four times a day (QID) | ORAL | Status: DC | PRN

## 2012-04-17 MED ORDER — ONDANSETRON 4 MG PO TBDP
4.0000 mg | ORAL_TABLET | Freq: Once | ORAL | Status: AC
  Administered 2012-04-17: 4 mg via ORAL
  Filled 2012-04-17: qty 1

## 2012-04-17 NOTE — ED Provider Notes (Signed)
History     CSN: [id number]  Arrival date & time 04/17/12  2017   First MD Initiated Contact with Patient 04/17/12 2318      Chief Complaint  Patient presents with  . Fall  . Head Injury    (Consider location/radiation/quality/duration/timing/severity/associated sxs/prior treatment) Patient is a 57 y.o. female presenting with fall and head injury. The history is provided by the patient.  Fall Associated symptoms include nausea and headaches. Pertinent negatives include no fever, no abdominal pain and no vomiting.  Head Injury Associated symptoms: headache and nausea   Associated symptoms: no neck pain and no vomiting    Patient tripped at around the 5 PM striking the back of her head questionable brief loss of consciousness. No syncope. Patient with complaint of pain to the back of the head nausea and dizziness since the fall. Patient originally went to urgent care and then was referred here for head CT. Currently the pain is 6/10 described as an ache. No neck pain no chest pain no abdominal pain no lower extremity pain. Patient has had recent surgery on her right wrist but no new pain to that area. No back pain.  Past Medical History  Diagnosis Date  . Depression   . Hypertension     Past Surgical History  Procedure Laterality Date  . Joint replacement      Family History  Problem Relation Age of Onset  . Hypertension Father     History  Substance Use Topics  . Smoking status: Never Smoker   . Smokeless tobacco: Not on file  . Alcohol Use: No    OB History   Grav Para Term Preterm Abortions TAB SAB Ect Mult Living                  Review of Systems  Constitutional: Negative for fever.  HENT: Negative for nosebleeds and neck pain.   Eyes: Negative for visual disturbance.  Respiratory: Negative for shortness of breath.   Cardiovascular: Negative for chest pain.  Gastrointestinal: Positive for nausea. Negative for vomiting and abdominal pain.   Musculoskeletal: Negative for back pain.  Skin: Negative for rash.  Neurological: Positive for dizziness and headaches.  Hematological: Does not bruise/bleed easily.  Psychiatric/Behavioral: Negative for confusion.    Allergies  Review of patient's allergies indicates no known allergies.  Home Medications   Current Outpatient Rx  Name  Route  Sig  Dispense  Refill  . ALPRAZolam (XANAX) 0.25 MG tablet   Oral   Take 1 tablet (0.25 mg total) by mouth 2 (two) times daily as needed for anxiety.   30 tablet   0   . chlorthalidone (HYGROTON) 25 MG tablet   Oral   Take 1 tablet (25 mg total) by mouth daily.   90 tablet   3   . FLUoxetine (PROZAC) 20 MG capsule   Oral   Take 1 capsule (20 mg total) by mouth daily.   90 capsule   3   . HYDROcodone-acetaminophen (NORCO/VICODIN) 5-325 MG per tablet   Oral   Take 1 tablet by mouth every 8 (eight) hours as needed for pain.   20 tablet   0   . HYDROcodone-acetaminophen (NORCO/VICODIN) 5-325 MG per tablet   Oral   Take 1-2 tablets by mouth every 6 (six) hours as needed for pain.   14 tablet   0   . lisinopril (PRINIVIL,ZESTRIL) 10 MG tablet   Oral   Take 1 tablet (10 mg total) by mouth  daily.   30 tablet   3   . ondansetron (ZOFRAN ODT) 4 MG disintegrating tablet   Oral   Take 1 tablet (4 mg total) by mouth every 8 (eight) hours as needed for nausea.   12 tablet   0     BP 189/104  Pulse 73  Temp(Src) 98.2 F (36.8 C) (Oral)  Resp 16  Ht 5\' 2"  (1.575 m)  Wt 160 lb (72.576 kg)  BMI 29.26 kg/m2  SpO2 98%  Physical Exam  Nursing note and vitals reviewed. Constitutional: She is oriented to person, place, and time. She appears well-developed and well-nourished.  HENT:  Head: Normocephalic.  Mouth/Throat: Oropharynx is clear and moist.  Posterior part of the head occiput with 3 cm hematoma contusion no active bleeding.  Eyes: Conjunctivae and EOM are normal. Pupils are equal, round, and reactive to light.   Neck: Normal range of motion. Neck supple.  Cardiovascular: Normal rate, regular rhythm and normal heart sounds.   No murmur heard. Pulmonary/Chest: Effort normal and breath sounds normal.  Abdominal: Soft. Bowel sounds are normal. There is no tenderness.  Musculoskeletal: Normal range of motion. She exhibits no tenderness.  Neurological: She is alert and oriented to person, place, and time. No cranial nerve deficit. She exhibits normal muscle tone. Coordination normal.  Skin: Skin is warm. No rash noted.    ED Course  Procedures (including critical care time)  Labs Reviewed  BASIC METABOLIC PANEL - Abnormal; Notable for the following:    GFR calc non Af Amer 72 (*)    GFR calc Af Amer 83 (*)    All other components within normal limits  CBC   Ct Head Wo Contrast  04/17/2012  *RADIOLOGY REPORT*  Clinical Data: Fall  CT HEAD WITHOUT CONTRAST  Technique:  Contiguous axial images were obtained from the base of the skull through the vertex without contrast.  Comparison: None.  Findings: No mass effect, midline shift, or acute intracranial hemorrhage.  Minimal chronic ischemic changes in the periventricular white matter.  Ventricles system is decompressed mastoid air cells are clear.  Cranium is intact.  IMPRESSION: No acute intracranial pathology.   Original Report Authenticated By: Jolaine Click, M.D.    Results for orders placed during the hospital encounter of 04/17/12  CBC      Result Value Range   WBC 9.8  4.0 - 10.5 K/uL   RBC 4.44  3.87 - 5.11 MIL/uL   Hemoglobin 13.4  12.0 - 15.0 g/dL   HCT 40.9  81.1 - 91.4 %   MCV 85.1  78.0 - 100.0 fL   MCH 30.2  26.0 - 34.0 pg   MCHC 35.4  30.0 - 36.0 g/dL   RDW 78.2  95.6 - 21.3 %   Platelets 324  150 - 400 K/uL  BASIC METABOLIC PANEL      Result Value Range   Sodium 140  135 - 145 mEq/L   Potassium 3.8  3.5 - 5.1 mEq/L   Chloride 104  96 - 112 mEq/L   CO2 22  19 - 32 mEq/L   Glucose, Bld 98  70 - 99 mg/dL   BUN 20  6 - 23 mg/dL    Creatinine, Ser 0.86  0.50 - 1.10 mg/dL   Calcium 9.6  8.4 - 57.8 mg/dL   GFR calc non Af Amer 72 (*) >90 mL/min   GFR calc Af Amer 83 (*) >90 mL/min      1. Head injury, acute, initial  encounter   2. Concussion, with loss of consciousness of unspecified duration, initial encounter       MDM   Patient referred from urgent care for a head CT. Patient had a fall around 5 PM striking the back of her head with a brief period of loss of consciousness following that she has felt nauseous and dizzy. Patient still feels nauseated. Head CT negative for any significant intracranial injury. Patient with 3 cm hematoma on the back of her head no active bleeding neurologic exam is normal.  Based on patient's symptoms she has symptoms of a concussion. Patient does not have any neck pain. Patient had recent surgery on her right wrist but not complaining any new pain to her wrist currently.        Shelda Jakes, MD 04/17/12 [phone]

## 2012-04-17 NOTE — ED Provider Notes (Signed)
History     CSN: [id number]  Arrival date & time 04/17/12  1839   First MD Initiated Contact with Patient 04/17/12 1853      Chief Complaint  Patient presents with  . Head Injury    (Consider location/radiation/quality/duration/timing/severity/associated sxs/prior treatment) HPI Comments: Patient presents to urgent care this evening describing that she fell at her job where she tripped over and fell landing initially with her right wrist and then turned around in the back of her head. Patient describes that she was unaware of her garment for a few seconds and she thinks she passed out. Since 5 PM or she has the initial fall she has been having a headache that has gotten progressively worse in the back of her head has taken some Motrin and apply ice but is not getting any relief. Patient at this point is feeling nauseous and dizzy most exacerbated when she tries to move or walks. She denies any vomiting 1 point describes her vision was blurry. Patient denies any weakness or paresthesias to her upper or lower extremities..  Patient is a 57 y.o. female presenting with head injury. The history is provided by the patient.  Head Injury Location:  Occipital Mechanism of injury: fall   Pain details:    Quality:  Dull and sharp   Severity:  Moderate   Timing:  Constant   Progression:  Worsening Chronicity:  New Relieved by:  Ice, NSAIDs and pressure Associated symptoms: blurred vision, headache, loss of consciousness and nausea   Associated symptoms: no difficulty breathing, no disorientation, no double vision, no focal weakness, no memory loss, no neck pain, no numbness, no seizures, no tinnitus and no vomiting   Risk factors: no alcohol use     Past Medical History  Diagnosis Date  . Depression     Past Surgical History  Procedure Laterality Date  . Joint replacement      Family History  Problem Relation Age of Onset  . Hypertension Father     History  Substance Use Topics   . Smoking status: Never Smoker   . Smokeless tobacco: Not on file  . Alcohol Use: No    OB History   Grav Para Term Preterm Abortions TAB SAB Ect Mult Living                  Review of Systems  Constitutional: Negative for activity change and appetite change.  HENT: Negative for neck pain and tinnitus.   Eyes: Positive for blurred vision. Negative for double vision.  Gastrointestinal: Positive for nausea. Negative for vomiting and abdominal pain.  Musculoskeletal: Negative for back pain.  Neurological: Positive for dizziness, loss of consciousness and headaches. Negative for tremors, focal weakness, seizures, speech difficulty, weakness and numbness.  Psychiatric/Behavioral: Negative for memory loss.    Allergies  Review of patient's allergies indicates no known allergies.  Home Medications   Current Outpatient Rx  Name  Route  Sig  Dispense  Refill  . ALPRAZolam (XANAX) 0.25 MG tablet   Oral   Take 1 tablet (0.25 mg total) by mouth 2 (two) times daily as needed for anxiety.   30 tablet   0   . chlorthalidone (HYGROTON) 25 MG tablet   Oral   Take 1 tablet (25 mg total) by mouth daily.   90 tablet   3   . FLUoxetine (PROZAC) 20 MG capsule   Oral   Take 1 capsule (20 mg total) by mouth daily.   90 capsule  3   . HYDROcodone-acetaminophen (NORCO/VICODIN) 5-325 MG per tablet   Oral   Take 1 tablet by mouth every 8 (eight) hours as needed for pain.   20 tablet   0   . lisinopril (PRINIVIL,ZESTRIL) 10 MG tablet   Oral   Take 1 tablet (10 mg total) by mouth daily.   30 tablet   3     BP 198/87  Pulse 78  Temp(Src) 98.5 F (36.9 C) (Oral)  SpO2 98%  Physical Exam  Nursing note and vitals reviewed. Constitutional: She is oriented to person, place, and time. She appears well-developed. No distress.  Neurological: She is alert and oriented to person, place, and time. She displays no atrophy. No cranial nerve deficit or sensory deficit. She exhibits normal  muscle tone. Coordination abnormal. GCS eye subscore is 4. GCS verbal subscore is 5.  Patient with difficulty to perform Romberg test  Skin: No rash noted.    ED Course  Procedures (including critical care time)  Labs Reviewed - No data to display No results found.   1. Head injury, closed, with brief LOC, initial encounter   2. Dizziness       MDM  Head injury with brief loss of consciousness and was transferred to the emergency department as patient is currently symptomatic as being about 4 hours since original fall. Patient will be transferred for further monitoring neuro checks- observation. Patient is hemodynamically stable, hypertensive.         Jimmie Molly, MD 04/17/12 2015

## 2012-04-17 NOTE — ED Notes (Signed)
Pt c/o fall today with head injury. Fell on concrete floor and hit back of her head. Ice pack was applied with mild relief. Feels nauseous presently. Pt reports possible LOC. No blurred vision.

## 2012-04-17 NOTE — ED Notes (Addendum)
Patient reports that she tripped while walking at work and fell and hit her head on the concrete floor.  No witnesses of fall; unsure of loss of consciousness -- patient reports, "I just remember waking up and I was surrounded by co-workers  I don't know if I passed out".  Patient complaining of pain in occipital region; denies dizziness and blurred vision.  Patient alert and oriented x4.  Hand grips and foot pushes bilaterally equal and strong.  Referred here from [facility].

## 2012-04-18 NOTE — ED Notes (Signed)
The patient is AOx4 and comfortable with her discharge instructions. 

## 2012-09-20 ENCOUNTER — Ambulatory Visit (INDEPENDENT_AMBULATORY_CARE_PROVIDER_SITE_OTHER): Payer: Medicare HMO | Admitting: Emergency Medicine

## 2012-09-20 ENCOUNTER — Ambulatory Visit: Payer: Managed Care, Other (non HMO)

## 2012-09-20 VITALS — BP 120/84 | HR 95 | Temp 98.9°F | Resp 18 | Ht 60.5 in | Wt 185.6 lb

## 2012-09-20 DIAGNOSIS — M545 Low back pain, unspecified: Secondary | ICD-10-CM

## 2012-09-20 DIAGNOSIS — S335XXA Sprain of ligaments of lumbar spine, initial encounter: Secondary | ICD-10-CM

## 2012-09-20 MED ORDER — CYCLOBENZAPRINE HCL 10 MG PO TABS: ORAL_TABLET | ORAL | Status: DC

## 2012-09-20 MED ORDER — MELOXICAM 15 MG PO TABS: 15.0000 mg | ORAL_TABLET | Freq: Every day | ORAL | Status: DC
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
## 2012-09-25 ENCOUNTER — Ambulatory Visit (INDEPENDENT_AMBULATORY_CARE_PROVIDER_SITE_OTHER): Payer: Managed Care, Other (non HMO) | Admitting: Emergency Medicine

## 2012-09-25 VITALS — BP 130/78 | HR 90 | Temp 99.1°F | Resp 16 | Ht 61.5 in | Wt 187.2 lb

## 2012-09-25 DIAGNOSIS — M545 Low back pain, unspecified: Secondary | ICD-10-CM

## 2012-09-25 DIAGNOSIS — I10 Essential (primary) hypertension: Secondary | ICD-10-CM

## 2012-09-25 LAB — BASIC METABOLIC PANEL
BUN: 22 mg/dL (ref 6–23)
CO2: 28 mEq/L (ref 19–32)
Calcium: 9.6 mg/dL (ref 8.4–10.5)
Creat: 0.67 mg/dL (ref 0.50–1.10)
Glucose, Bld: 117 mg/dL — ABNORMAL HIGH (ref 70–99)
Sodium: 140 mEq/L (ref 135–145)

## 2012-09-25 MED ORDER — CYCLOBENZAPRINE HCL 10 MG PO TABS: ORAL_TABLET | ORAL | Status: DC

## 2012-09-25 MED ORDER — CHLORTHALIDONE 25 MG PO TABS: 25.0000 mg | ORAL_TABLET | Freq: Every day | ORAL | Status: DC

## 2012-09-25 NOTE — Progress Notes (Signed)
  Subjective:    Patient ID: April Stevens, female    DOB: September 20, 1955, 57 y.o.   MRN: [id number]  HPI Pt here to follow up on her back. Xrays showed spondylolisthesis. States the meds have been helping her. States her pain is about 50% better today.    Review of Systems     Objective:   Physical Exam there is tenderness over lower lumbar spine at L5-S1. Straight leg raising is negative in both legs. Her deep tendon reflexes are 2+ and symmetrical. Motor strength is 5 out of 5 in all muscle groups.       Assessment & Plan:  Patient has an L5-S1 spondylolisthesis. She is improving with the medications she is on. She asked for a refill on her muscle relaxant and HCTZ. We'll go ahead and check a beam at continued HCTZ continue Flexeril at night and plan an MRI of the LS spine if symptoms begin to worsen .

## 2012-11-16 ENCOUNTER — Telehealth: Payer: Self-pay

## 2012-11-16 DIAGNOSIS — F419 Anxiety disorder, unspecified: Secondary | ICD-10-CM

## 2012-11-16 MED ORDER — ALPRAZOLAM 0.25 MG PO TABS: 0.2500 mg | ORAL_TABLET | Freq: Two times a day (BID) | ORAL | Status: DC | PRN

## 2012-11-16 NOTE — Telephone Encounter (Signed)
Pharm reqs RF of alprazolam. Dr Patsy Lager, do you want to Rf or pt RTC?

## 2012-12-02 ENCOUNTER — Other Ambulatory Visit: Payer: Self-pay | Admitting: Family Medicine

## 2012-12-02 DIAGNOSIS — F411 Generalized anxiety disorder: Secondary | ICD-10-CM

## 2012-12-25 ENCOUNTER — Ambulatory Visit (INDEPENDENT_AMBULATORY_CARE_PROVIDER_SITE_OTHER): Payer: Managed Care, Other (non HMO) | Admitting: Physician Assistant

## 2012-12-25 VITALS — BP 140/80 | HR 80 | Temp 98.7°F | Resp 16 | Ht 61.5 in | Wt 177.0 lb

## 2012-12-25 DIAGNOSIS — R05 Cough: Secondary | ICD-10-CM

## 2012-12-25 DIAGNOSIS — R059 Cough, unspecified: Secondary | ICD-10-CM

## 2012-12-25 DIAGNOSIS — J329 Chronic sinusitis, unspecified: Secondary | ICD-10-CM

## 2012-12-25 MED ORDER — CEFDINIR 300 MG PO CAPS: 300.0000 mg | ORAL_CAPSULE | Freq: Two times a day (BID) | ORAL | Status: DC

## 2012-12-25 MED ORDER — HYDROCOD POLST-CHLORPHEN POLST 10-8 MG/5ML PO LQCR: 5.0000 mL | Freq: Two times a day (BID) | ORAL | Status: AC

## 2012-12-25 NOTE — Progress Notes (Signed)
   Subjective:    Patient ID: April Stevens, female    DOB: 03/21/1955, 57 y.o.   MRN: [id number]  HPI Pt presents to clinic with 3 wk h/o cold symptoms.  She started with what she thought was just a cold but then it has not gotten better- she started with congestion and rhinorrhea and a little cough then 1 week ago she developed a fever which has now resolved and now she has congestion but no rhinorrhea - she is having some PND which feels really thick.  She is not having headaches or teeth pain or dizziness. Her cough is keeping her up at night - it is always dry and sometimes feels like a tickle in her throat and sometimes in her chest - she is not SOB and has not had wheezing.  OTC - mucinex and delsym using both regularly since last week - she does not feel like her symptoms have changed since she started them. No sick contacts Review of Systems  Constitutional: Positive for fever (now resolved - 1 week ago). Negative for chills.  HENT: Positive for congestion, postnasal drip and voice change. Negative for rhinorrhea.   Respiratory: Positive for cough (dry). Negative for shortness of breath and wheezing.   Musculoskeletal: Negative for myalgias.  Neurological: Negative for headaches.       Objective:   Physical Exam  Vitals reviewed. Constitutional: She is oriented to person, place, and time. She appears well-developed and well-nourished.  HENT:  Head: Normocephalic and atraumatic.  Right Ear: Hearing, tympanic membrane, external ear and ear canal normal.  Left Ear: Hearing, tympanic membrane, external ear and ear canal normal.  Nose: Mucosal edema (red) present.  Mouth/Throat: Uvula is midline, oropharynx is clear and moist and mucous membranes are normal.  Eyes: Conjunctivae are normal.  Neck: Normal range of motion.  Pulmonary/Chest: Effort normal.  When patient takes a deep breath she has a deep sounding cough  Neurological: She is alert and oriented to person, place, and time.   Skin: Skin is warm and dry.  Psychiatric: She has a normal mood and affect. Her behavior is normal. Judgment and thought content normal.      Assessment & Plan:  Sinusitis - Plan: cefdinir (OMNICEF) 300 MG capsule  Cough - Plan: chlorpheniramine-HYDROcodone (TUSSIONEX PENNKINETIC ER) 10-8 MG/5ML LQCR  We will treat patient for her cough symptomatically that is most likely related to her PND from her sinus infection.  The abx should also cover for bronchitis.  Benny Lennert PA-C 12/25/2012 12:05 PM

## 2012-12-25 NOTE — Patient Instructions (Signed)
Push fluids.  Continue Mucinex (make sure there is no psuedofed in it which will cause your BP to increase)

## 2013-02-08 ENCOUNTER — Ambulatory Visit: Payer: Managed Care, Other (non HMO)

## 2013-02-08 ENCOUNTER — Ambulatory Visit (INDEPENDENT_AMBULATORY_CARE_PROVIDER_SITE_OTHER): Payer: Managed Care, Other (non HMO) | Admitting: Emergency Medicine

## 2013-02-08 VITALS — BP 124/88 | HR 90 | Temp 98.4°F | Resp 18 | Ht 61.5 in | Wt 172.0 lb

## 2013-02-08 DIAGNOSIS — M545 Low back pain, unspecified: Secondary | ICD-10-CM

## 2013-02-08 DIAGNOSIS — IMO0002 Reserved for concepts with insufficient information to code with codable children: Secondary | ICD-10-CM

## 2013-02-08 DIAGNOSIS — M4317 Spondylolisthesis, lumbosacral region: Secondary | ICD-10-CM

## 2013-02-08 DIAGNOSIS — S3992XA Unspecified injury of lower back, initial encounter: Secondary | ICD-10-CM

## 2013-02-08 DIAGNOSIS — Q762 Congenital spondylolisthesis: Secondary | ICD-10-CM

## 2013-02-08 MED ORDER — HYDROCODONE-ACETAMINOPHEN 5-325 MG PO TABS: 1.0000 | ORAL_TABLET | Freq: Four times a day (QID) | ORAL | Status: DC | PRN

## 2013-02-08 MED ORDER — MELOXICAM 15 MG PO TABS: 15.0000 mg | ORAL_TABLET | Freq: Every day | ORAL | Status: DC

## 2013-02-08 NOTE — Patient Instructions (Signed)
Begin taking the meloxicam once daily (no additional ibuprofen or aleve with this medicine)  Use the Norco every 6 hours if needed  Stay out of work until Tuesday  Begin gentle back exercises, stretches  You will get a call about scheduling your appointment with Dr. Lynann Bologna, the back doctor
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
## 2013-02-11 ENCOUNTER — Telehealth: Payer: Self-pay

## 2013-02-11 DIAGNOSIS — M545 Low back pain, unspecified: Secondary | ICD-10-CM

## 2013-02-11 DIAGNOSIS — S3992XA Unspecified injury of lower back, initial encounter: Secondary | ICD-10-CM

## 2013-02-11 NOTE — Telephone Encounter (Signed)
Pt called to speak with Benjamine Mola, she states that the pain medication prescribed to her is not working. She was seen on 1/22/ Please call  [phone]

## 2013-02-12 MED ORDER — HYDROCODONE-ACETAMINOPHEN 5-325 MG PO TABS: 2.0000 | ORAL_TABLET | Freq: Four times a day (QID) | ORAL | Status: DC | PRN

## 2013-02-12 NOTE — Telephone Encounter (Signed)
Pt can take 2 tabs Norco every 6 hours if needed for pain.  I have printed a new rx with increased quantity and correct sig.  She will have to pick up the rx.  If she is worsening or pain is uncontrolled with Norco, she needs to RTC for recheck or go the ED  Can we check on the status of her referral to ortho?

## 2013-02-13 NOTE — Telephone Encounter (Signed)
Called pt, Rx ready to pick up. LMOM to let her know

## 2013-03-06 ENCOUNTER — Other Ambulatory Visit: Payer: Self-pay | Admitting: Family Medicine

## 2013-03-06 DIAGNOSIS — F411 Generalized anxiety disorder: Secondary | ICD-10-CM

## 2013-03-07 NOTE — Telephone Encounter (Signed)
I gave the pt a RF of prozac w/note that she needs OV for more, but not sure if you want to RF this too. Pended w/note needs OV.

## 2013-03-08 ENCOUNTER — Encounter: Payer: Self-pay | Admitting: Family Medicine

## 2013-04-28 ENCOUNTER — Other Ambulatory Visit: Payer: Self-pay | Admitting: Family Medicine

## 2013-06-01 IMAGING — CT CT HEAD W/O CM
1 of 2 series · 16 of 30 positions shown, 20 images · non-contrast
Comparison: None.

CLINICAL DATA: Fall

CT HEAD WITHOUT CONTRAST
TECHNIQUE: Contiguous axial images were obtained from the base of
the skull through the vertex without contrast.

[Series 3: recon 2: brain · axial · 0.47mm/px · z∈[-108,+38]mm · 16 of 80 slices shown, 20 images]
[im 5/80  brain]
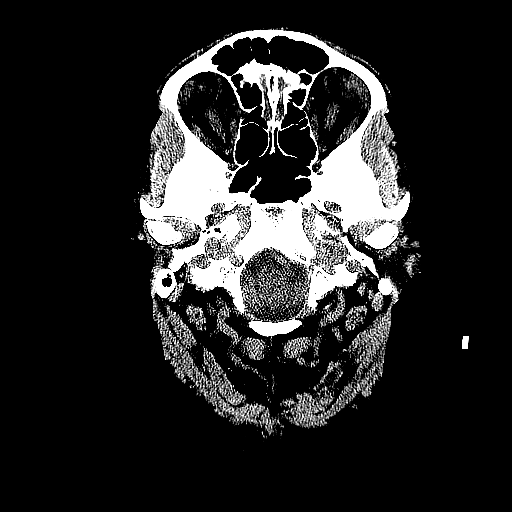
[im 5/80  bone]
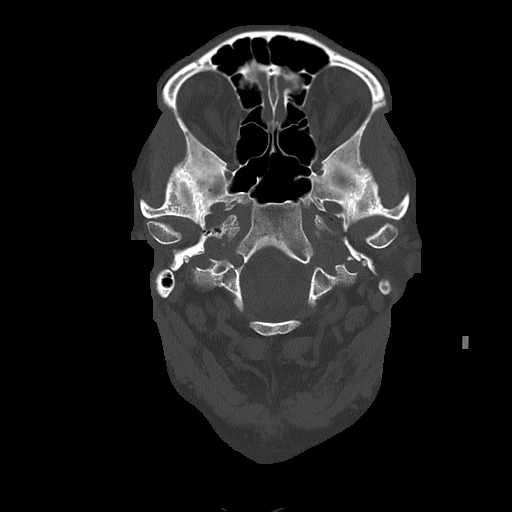
[im 9/80  brain]
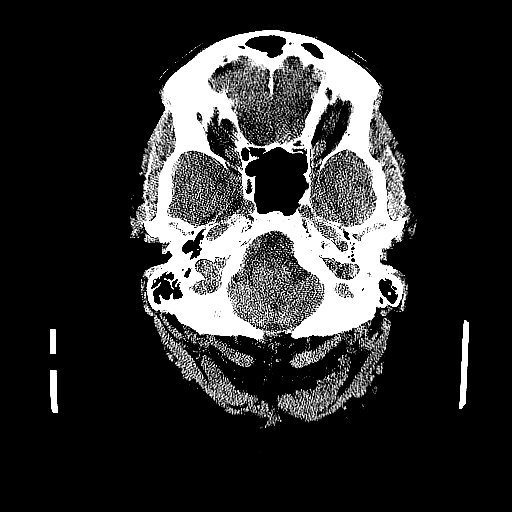
[im 13/80  brain]
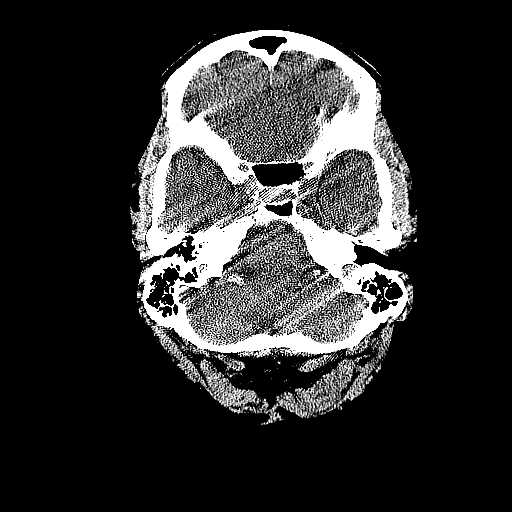
[im 17/80  brain]
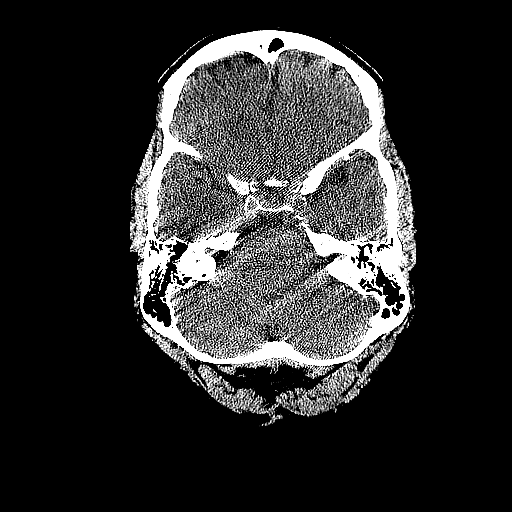
[im 25/80  brain]
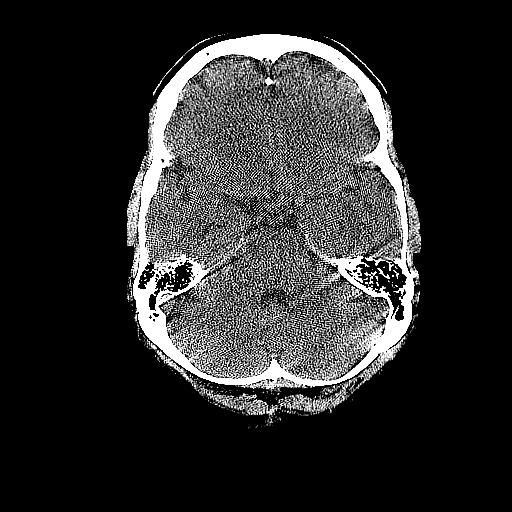
[im 25/80  bone]
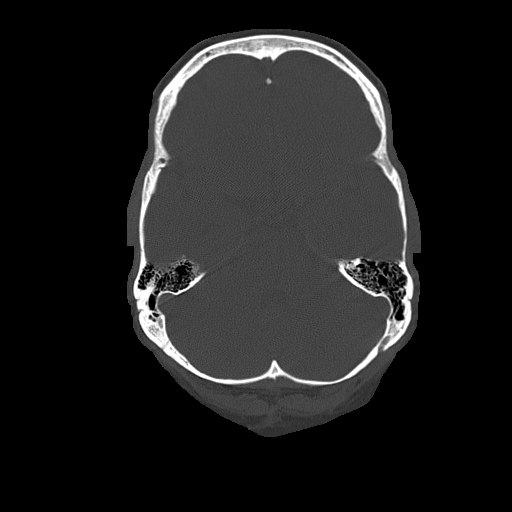
[im 30/80  brain]
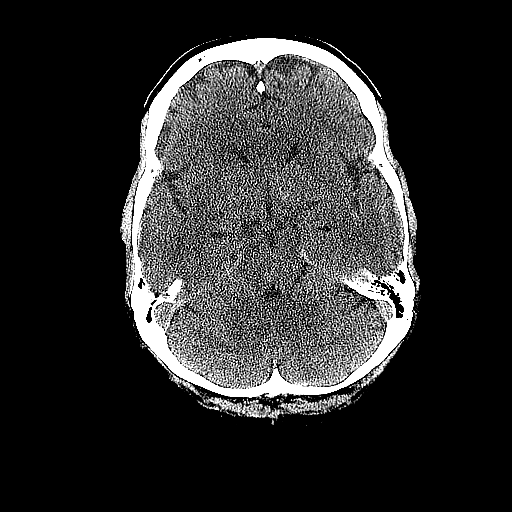
[im 34/80  brain]
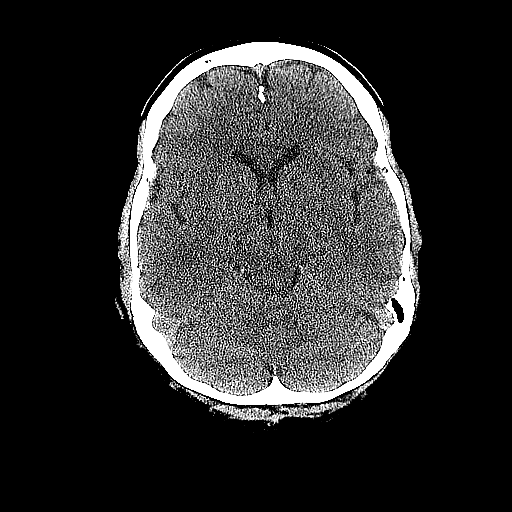
[im 38/80  brain]
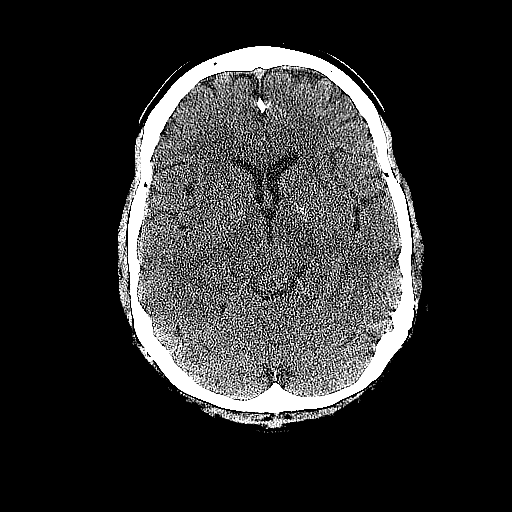
[im 42/80  brain]
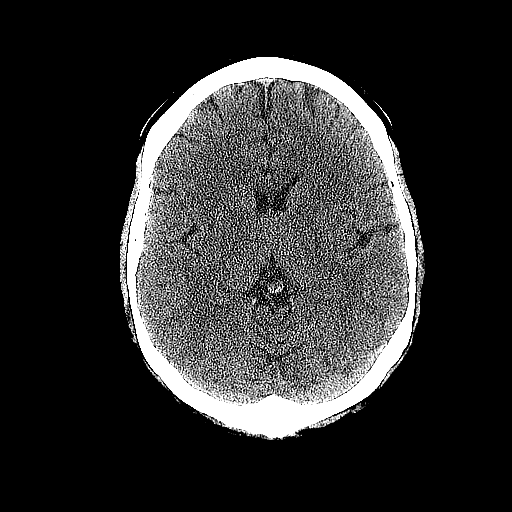
[im 42/80  bone]
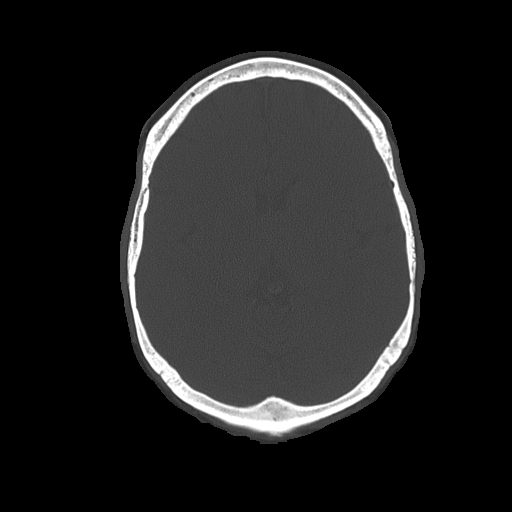
[im 46/80  brain]
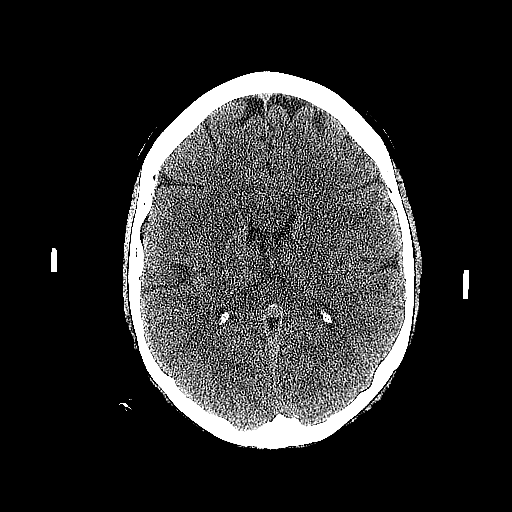
[im 50/80  brain]
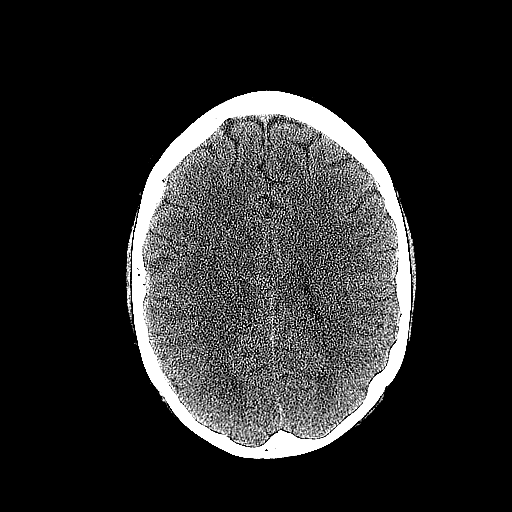
[im 55/80  brain]
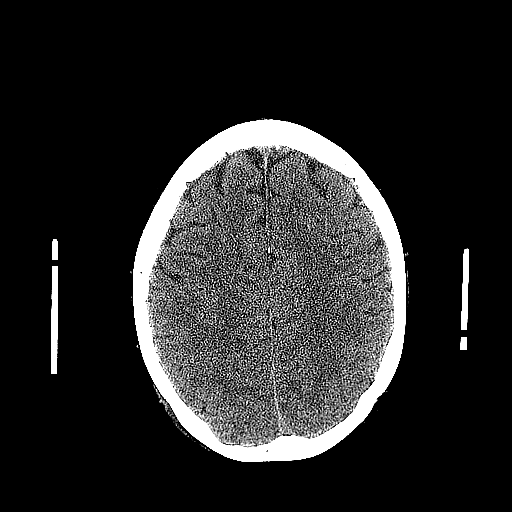
[im 63/80  brain]
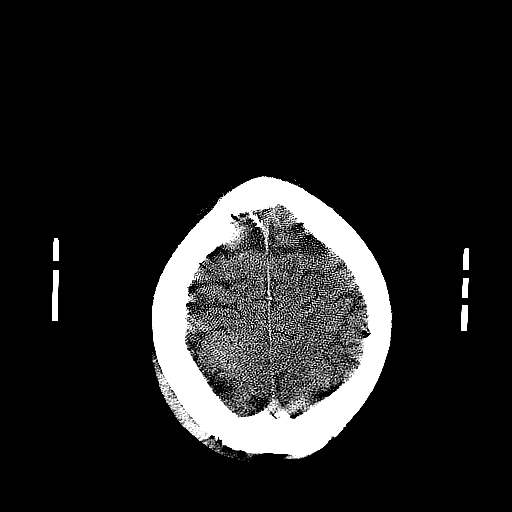
[im 63/80  bone]
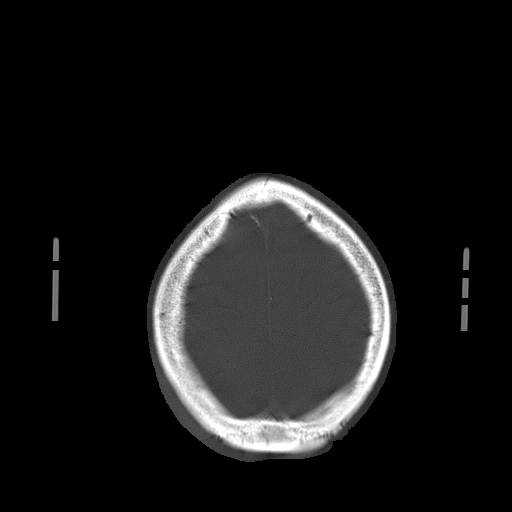
[im 67/80  brain]
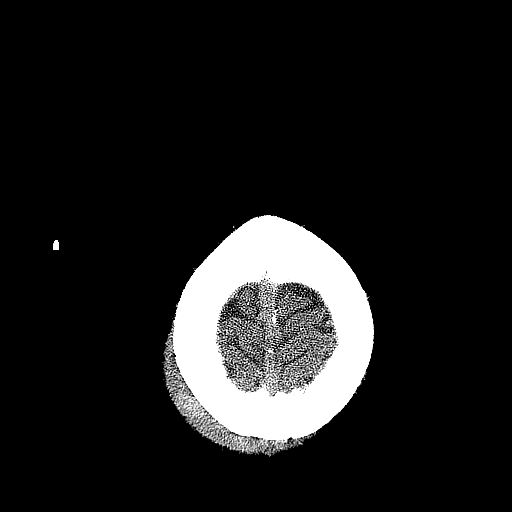
[im 71/80  brain]
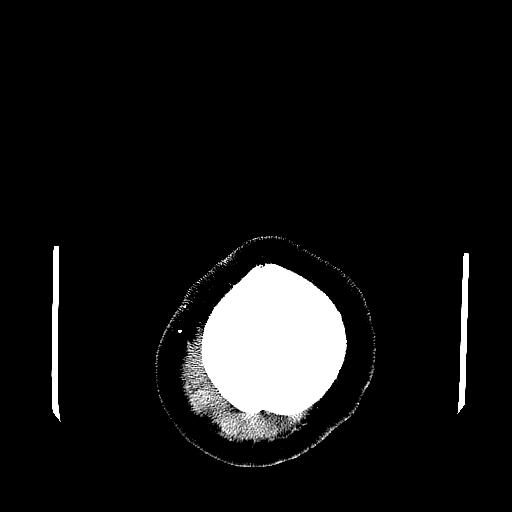
[im 75/80  brain]
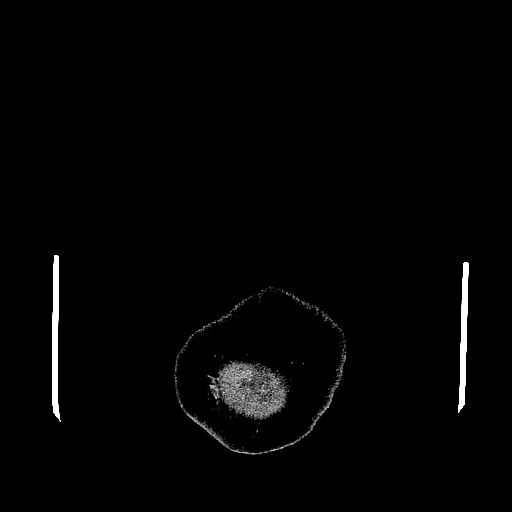

[16 of 30 positions shown; findings below may reference images not displayed]

FINDINGS: No mass effect, midline shift, or acute intracranial
hemorrhage.  Minimal chronic ischemic changes in the
periventricular white matter.  Ventricles system is decompressed
mastoid air cells are clear.  Cranium is intact.
IMPRESSION: No acute intracranial pathology.

## 2013-06-17 ENCOUNTER — Other Ambulatory Visit: Payer: Self-pay | Admitting: Physician Assistant

## 2013-06-26 ENCOUNTER — Ambulatory Visit (INDEPENDENT_AMBULATORY_CARE_PROVIDER_SITE_OTHER): Payer: Managed Care, Other (non HMO) | Admitting: Family Medicine

## 2013-06-26 ENCOUNTER — Telehealth: Payer: Self-pay

## 2013-06-26 VITALS — BP 115/70 | HR 72 | Temp 98.9°F | Resp 18 | Ht 61.0 in | Wt 171.1 lb

## 2013-06-26 DIAGNOSIS — M25519 Pain in unspecified shoulder: Secondary | ICD-10-CM

## 2013-06-26 DIAGNOSIS — I10 Essential (primary) hypertension: Secondary | ICD-10-CM

## 2013-06-26 DIAGNOSIS — M545 Low back pain, unspecified: Secondary | ICD-10-CM

## 2013-06-26 DIAGNOSIS — S43429A Sprain of unspecified rotator cuff capsule, initial encounter: Secondary | ICD-10-CM

## 2013-06-26 DIAGNOSIS — S3992XA Unspecified injury of lower back, initial encounter: Secondary | ICD-10-CM

## 2013-06-26 DIAGNOSIS — F411 Generalized anxiety disorder: Secondary | ICD-10-CM

## 2013-06-26 DIAGNOSIS — IMO0002 Reserved for concepts with insufficient information to code with codable children: Secondary | ICD-10-CM

## 2013-06-26 MED ORDER — PREDNISONE 20 MG PO TABS: ORAL_TABLET | ORAL | Status: DC

## 2013-06-26 MED ORDER — ALPRAZOLAM 0.25 MG PO TABS: 0.2500 mg | ORAL_TABLET | Freq: Two times a day (BID) | ORAL | Status: DC | PRN

## 2013-06-26 MED ORDER — HYDROCODONE-ACETAMINOPHEN 5-325 MG PO TABS: 2.0000 | ORAL_TABLET | Freq: Four times a day (QID) | ORAL | Status: DC | PRN

## 2013-06-26 MED ORDER — CHLORTHALIDONE 25 MG PO TABS: 25.0000 mg | ORAL_TABLET | Freq: Every day | ORAL | Status: DC

## 2013-06-26 MED ORDER — FLUOXETINE HCL 20 MG PO CAPS: ORAL_CAPSULE | ORAL | Status: DC

## 2013-06-26 NOTE — Patient Instructions (Signed)
Rotator Cuff Injury Rotator cuff injury is any type of injury to the set of muscles and tendons that make up the stabilizing unit of your shoulder. This unit holds the ball of your upper arm bone (humerus) in the socket of your shoulder blade (scapula).  CAUSES Injuries to your rotator cuff most commonly come from sports or activities that cause your arm to be moved repeatedly over your head. Examples of this include throwing, weight lifting, swimming, or racquet sports. Long lasting (chronic) irritation of your rotator cuff can cause soreness and swelling (inflammation), bursitis, and eventual damage to your tendons, such as a tear (rupture). SIGNS AND SYMPTOMS Acute rotator cuff tear:  Sudden tearing sensation followed by severe pain shooting from your upper shoulder down your arm toward your elbow.  Decreased range of motion of your shoulder because of pain and muscle spasm.  Severe pain.  Inability to raise your arm out to the side because of pain and loss of muscle power (large tears). Chronic rotator cuff tear:  Pain that usually is worse at night and may interfere with sleep.  Gradual weakness and decreased shoulder motion as the pain worsens.  Decreased range of motion. Rotator cuff tendinitis:  Deep ache in your shoulder and the outside upper arm over your shoulder.  Pain that comes on gradually and becomes worse when lifting your arm to the side or turning it inward. DIAGNOSIS Rotator cuff injury is diagnosed through a medical history, physical exam, and imaging exam. The medical history helps determine the type of rotator cuff injury. Your health care provider will look at your injured shoulder, feel the injured area, and ask you to move your shoulder in different positions. X-ray exams typically are done to rule out other causes of shoulder pain, such as fractures. MRI is the exam of choice for the most severe shoulder injuries because the images show muscles and tendons.    TREATMENT  Chronic tear:  Medicine for pain, such as acetaminophen or ibuprofen.  Physical therapy and range-of-motion exercises may be helpful in maintaining shoulder function and strength.  Steroid injections into your shoulder joint.  Surgical repair of the rotator cuff if the injury does not heal with noninvasive treatment. Acute tear:  Anti-inflammatory medicines such as ibuprofen and naproxen to help reduce pain and swelling.  A sling to help support your arm and rest your rotator cuff muscles. Long-term use of a sling is not advised. It may cause significant stiffening of the shoulder joint.  Surgery may be considered within a few weeks, especially in younger, active people, to return the shoulder to full function.  Indications for surgical treatment include the following:  Age younger than 60 years.  Rotator cuff tears that are complete.  Physical therapy, rest, and anti-inflammatory medicines have been used for 6 8 weeks, with no improvement.  Employment or sporting activity that requires constant shoulder use. Tendinitis:  Anti-inflammatory medicines such as ibuprofen and naproxen to help reduce pain and swelling.  A sling to help support your arm and rest your rotator cuff muscles. Long-term use of a sling is not advised. It may cause significant stiffening of the shoulder joint.  Severe tendinitis may require:  Steroid injections into your shoulder joint.  Physical therapy.  Surgery. HOME CARE INSTRUCTIONS   Apply ice to your injury:  Put ice in a plastic bag.  Place a towel between your skin and the bag.  Leave the ice on for 20 minutes, 2 3 times a day.    If you have a shoulder immobilizer (sling and straps), wear it until told otherwise by your health care provider.  You may want to sleep on several pillows or in a recliner at night to lessen swelling and pain.  Only take over-the-counter or prescription medicines for pain, discomfort, or fever as  directed by your health care provider.  Do simple hand squeezing exercises with a soft rubber ball to decrease hand swelling. SEEK MEDICAL CARE IF:   Your shoulder pain increases, or new pain or numbness develops in your arm, hand, or fingers.  Your hand or fingers are colder than your other hand. SEEK IMMEDIATE MEDICAL CARE IF:   Your arm, hand, or fingers are numb or tingling.  Your arm, hand, or fingers are increasingly swollen and painful, or they turn white or blue. MAKE SURE YOU:  Understand these instructions.  Will watch your condition.  Will get help right away if you are not doing well or get worse. Document Released: 01/02/2000 Document Revised: 10/25/2012 Document Reviewed: 08/16/2012 Exit[employer] Patient Information 2014 [employer].  
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
## 2013-06-26 NOTE — Telephone Encounter (Signed)
Pt called and said Prozac was not sent into pharmacy.   [facility]

## 2013-06-26 NOTE — Telephone Encounter (Signed)
Sent RFs of prozac.

## 2013-08-14 ENCOUNTER — Other Ambulatory Visit: Payer: Self-pay | Admitting: Emergency Medicine

## 2013-08-29 ENCOUNTER — Telehealth: Payer: Self-pay

## 2013-08-29 NOTE — Telephone Encounter (Signed)
Patient needs to see orthopedics if coccyx or shoulder still sore.

## 2013-08-29 NOTE — Telephone Encounter (Signed)
PT IN NEED OF HER HYDROCODONE, PLEASE CALL [phone] WHEN READY FOR PICK UP

## 2013-08-31 NOTE — Telephone Encounter (Signed)
Lm pt needs OV or referral to Ortho

## 2013-11-02 ENCOUNTER — Other Ambulatory Visit: Payer: Self-pay

## 2013-12-21 ENCOUNTER — Other Ambulatory Visit: Payer: Self-pay | Admitting: Family Medicine

## 2014-01-07 ENCOUNTER — Other Ambulatory Visit: Payer: Self-pay | Admitting: Family Medicine

## 2014-01-15 NOTE — Telephone Encounter (Signed)
Faxed

## 2014-01-19 ENCOUNTER — Other Ambulatory Visit: Payer: Self-pay | Admitting: Family Medicine

## 2014-02-15 ENCOUNTER — Other Ambulatory Visit: Payer: Self-pay | Admitting: Physician Assistant

## 2014-02-28 ENCOUNTER — Telehealth: Payer: Self-pay

## 2014-02-28 NOTE — Telephone Encounter (Signed)
Left detailed message that she needs OV and should CB to set up appt or let us know when she can come to walk in so that we can fill her medication until then.

## 2014-02-28 NOTE — Telephone Encounter (Signed)
Pt of Dr. Lorelei Pont requesting refill on FLUoxetine (PROZAC) 20 MG capsule [[id number]]

## 2014-03-01 MED ORDER — FLUOXETINE HCL 20 MG PO CAPS: 20.0000 mg | ORAL_CAPSULE | Freq: Every day | ORAL | Status: DC

## 2014-03-01 NOTE — Telephone Encounter (Signed)
Pt CB and stated she is off this coming Sun and Mon, but Dr Lorelei Pont is not working. Pt will come to walk in on Thurs 03/14/14. I am sending in another 2 wks RF to cover her.

## 2014-03-14 ENCOUNTER — Ambulatory Visit (INDEPENDENT_AMBULATORY_CARE_PROVIDER_SITE_OTHER): Payer: Managed Care, Other (non HMO) | Admitting: Family Medicine

## 2014-03-14 VITALS — BP 140/80 | HR 75 | Temp 98.2°F | Resp 18 | Ht 61.5 in | Wt 182.0 lb

## 2014-03-14 DIAGNOSIS — F32A Depression, unspecified: Secondary | ICD-10-CM

## 2014-03-14 DIAGNOSIS — F419 Anxiety disorder, unspecified: Secondary | ICD-10-CM

## 2014-03-14 DIAGNOSIS — I10 Essential (primary) hypertension: Secondary | ICD-10-CM

## 2014-03-14 DIAGNOSIS — F329 Major depressive disorder, single episode, unspecified: Secondary | ICD-10-CM

## 2014-03-14 LAB — COMPLETE METABOLIC PANEL WITH GFR
ALT: 14 U/L (ref 0–35)
Albumin: 4.4 g/dL (ref 3.5–5.2)
Alkaline Phosphatase: 92 U/L (ref 39–117)
GFR, Est Non African American: 71 mL/min
Glucose, Bld: 122 mg/dL — ABNORMAL HIGH (ref 70–99)
Potassium: 3.2 mEq/L — ABNORMAL LOW (ref 3.5–5.3)
Sodium: 139 mEq/L (ref 135–145)
Total Protein: 7 g/dL (ref 6.0–8.3)

## 2014-03-14 LAB — COMPLETE METABOLIC PANEL WITHOUT GFR
AST: 20 U/L (ref 0–37)
BUN: 14 mg/dL (ref 6–23)
CO2: 29 meq/L (ref 19–32)
Calcium: 9.2 mg/dL (ref 8.4–10.5)
Chloride: 100 meq/L (ref 96–112)
Creat: 0.89 mg/dL (ref 0.50–1.10)
GFR, Est African American: 81 mL/min
Total Bilirubin: 0.7 mg/dL (ref 0.2–1.2)

## 2014-03-14 MED ORDER — CHLORTHALIDONE 25 MG PO TABS: 25.0000 mg | ORAL_TABLET | Freq: Every day | ORAL | Status: DC

## 2014-03-14 MED ORDER — FLUOXETINE HCL 20 MG PO CAPS: 20.0000 mg | ORAL_CAPSULE | Freq: Every day | ORAL | Status: DC

## 2014-03-14 MED ORDER — ALPRAZOLAM 0.25 MG PO TABS: 0.2500 mg | ORAL_TABLET | Freq: Two times a day (BID) | ORAL | Status: DC | PRN

## 2014-03-14 NOTE — Progress Notes (Signed)
Chief Complaint:  Chief Complaint  Patient presents with  . Medication Refill    hygroton, xanax, prozac    HPI: April Stevens is a 59 y.o. female who is here for  Medication refills She states she was forced to come in otherwise she was not going to get her medicine Does not like getting her blood drawn She takes her BP at home and on average they run 130/80s. No Cp , dizziness, sob or palpations. No SEs. Last pill was today She is doing will with her prozac and prn xanax. She needs to take her prozac but her xanax is prn .  No SEs with controlled substances. She denies any dependence or abuse. She has no SI/HI/hallucinations She started taking prozac over a man ( they are no longer together)  many moons ago, she states she does not need a man but she does have a boyfriend. Apparently they do ok together.She works at [employer].    Past Medical History  Diagnosis Date  . Depression   . Hypertension    Past Surgical History  Procedure Laterality Date  . Joint replacement     History   Social History  . Marital Status: Single    Spouse Name: N/A  . Number of Children: N/A  . Years of Education: N/A   Social History Main Topics  . Smoking status: Former Research scientist (life sciences)  . Smokeless tobacco: Not on file  . Alcohol Use: No  . Drug Use: No  . Sexual Activity: Yes   Other Topics Concern  . None   Social History Narrative   Family History  Problem Relation Age of Onset  . Hypertension Father    No Known Allergies Prior to Admission medications   Medication Sig Start Date End Date Taking? Authorizing Provider  ALPRAZolam Duanne Moron) 0.25 MG tablet TAKE ONE TABLET BY MOUTH TWICE A DAY AS NEEDED 01/07/14  Yes Robyn Haber, MD  aspirin 81 MG tablet Take 81 mg by mouth daily.   Yes Historical Provider, MD  chlorthalidone (HYGROTON) 25 MG tablet Take 1 tablet (25 mg total) by mouth daily. 06/26/13  Yes Robyn Haber, MD  FLUoxetine (PROZAC) 20 MG capsule Take 1 capsule (20  mg total) by mouth daily. PATIENT NEEDS OFFICE VISIT FOR ADDITIONAL REFILLS 12/23/13  Yes Robyn Haber, MD  vitamin B-12 (CYANOCOBALAMIN) 100 MCG tablet Take 100 mcg by mouth daily.   Yes Historical Provider, MD     ROS: The patient denies fevers, chills, night sweats, unintentional weight loss, chest pain, palpitations, wheezing, dyspnea on exertion, nausea, vomiting, abdominal pain, dysuria, hematuria, melena, numbness, weakness, or tingling.  All other systems have been reviewed and were otherwise negative with the exception of those mentioned in the HPI and as above.    PHYSICAL EXAM: Filed Vitals:   03/14/14 1115  BP: 140/80  Pulse: 75  Temp: 98.2 F (36.8 C)  Resp: 18   Filed Vitals:   03/14/14 1115  Height: 5' 1.5" (1.562 m)  Weight: 182 lb (82.555 kg)   Body mass index is 33.84 kg/(m^2).  General: Alert, no acute distress HEENT:  Normocephalic, atraumatic, oropharynx patent. EOMI, PERRLA Cardiovascular:  Regular rate and rhythm, no rubs murmurs or gallops.  No Carotid bruits, radial pulse intact. No pedal edema.  Respiratory: Clear to auscultation bilaterally.  No wheezes, rales, or rhonchi.  No cyanosis, no use of accessory musculature GI: No organomegaly, abdomen is soft and non-tender, positive bowel sounds.  No masses.  Skin: No rashes. Neurologic: Facial musculature symmetric. Psychiatric: Patient is appropriate throughout our interaction. Lymphatic: No cervical lymphadenopathy Musculoskeletal: Gait intact.   LABS: Results for orders placed or performed in visit on 16/10/96  Basic metabolic panel  Result Value Ref Range   Sodium 140 135 - 145 mEq/L   Potassium 3.6 3.5 - 5.3 mEq/L   Chloride 105 96 - 112 mEq/L   CO2 28 19 - 32 mEq/L   Glucose, Bld 117 (H) 70 - 99 mg/dL   BUN 22 6 - 23 mg/dL   Creat 0.67 0.50 - 1.10 mg/dL   Calcium 9.6 8.4 - 10.5 mg/dL     EKG/XRAY:   Primary read interpreted by Dr. Marin Comment at [facility].   ASSESSMENT/PLAN: Encounter  Diagnoses  Name Primary?  . Essential hypertension Yes  . Anxiety   . Depression    Refilled Chlorthalidone, Xanax and also Prozac CMP pending Monitor BP Fu in 6 months  Gross sideeffects, risk and benefits, and alternatives of medications d/w patient. Patient is aware that all medications have potential sideeffects and we are unable to predict every sideeffect or drug-drug interaction that may occur.  Lovett Coffin, [place], DO 03/14/2014 11:41 AM

## 2014-03-15 ENCOUNTER — Telehealth: Payer: Self-pay | Admitting: Family Medicine

## 2014-03-15 DIAGNOSIS — E876 Hypokalemia: Secondary | ICD-10-CM

## 2014-03-15 MED ORDER — POTASSIUM CHLORIDE CRYS ER 20 MEQ PO TBCR: 20.0000 meq | EXTENDED_RELEASE_TABLET | Freq: Every day | ORAL | Status: DC

## 2014-03-15 NOTE — Telephone Encounter (Signed)
Spoke to patient about labs. Wills tart on Kdur x 1 week, repeat BMP x 2 weeks and then she can do maintenance banana a day or coconut water. She ahd eaten day of blood draw hence elevated glucose.

## 2014-10-08 ENCOUNTER — Emergency Department (HOSPITAL_COMMUNITY): Payer: Managed Care, Other (non HMO)

## 2014-10-08 ENCOUNTER — Ambulatory Visit (INDEPENDENT_AMBULATORY_CARE_PROVIDER_SITE_OTHER): Payer: Managed Care, Other (non HMO) | Admitting: Family Medicine

## 2014-10-08 ENCOUNTER — Encounter (HOSPITAL_COMMUNITY): Payer: Self-pay | Admitting: Emergency Medicine

## 2014-10-08 ENCOUNTER — Emergency Department (HOSPITAL_COMMUNITY)
Admission: EM | Admit: 2014-10-08 | Discharge: 2014-10-09 | Disposition: A | Discharge status: Home / Self Care | Payer: Managed Care, Other (non HMO) | Attending: Emergency Medicine | Admitting: Emergency Medicine

## 2014-10-08 VITALS — BP 138/80 | HR 72 | Temp 97.6°F | Resp 16

## 2014-10-08 DIAGNOSIS — F1310 Sedative, hypnotic or anxiolytic abuse, uncomplicated: Secondary | ICD-10-CM | POA: Diagnosis not present

## 2014-10-08 DIAGNOSIS — R4701 Aphasia: Secondary | ICD-10-CM | POA: Diagnosis present

## 2014-10-08 DIAGNOSIS — F329 Major depressive disorder, single episode, unspecified: Secondary | ICD-10-CM | POA: Diagnosis not present

## 2014-10-08 DIAGNOSIS — D1802 Hemangioma of intracranial structures: Secondary | ICD-10-CM | POA: Diagnosis not present

## 2014-10-08 DIAGNOSIS — Z791 Long term (current) use of non-steroidal anti-inflammatories (NSAID): Secondary | ICD-10-CM | POA: Diagnosis not present

## 2014-10-08 DIAGNOSIS — F19921 Other psychoactive substance use, unspecified with intoxication with delirium: Secondary | ICD-10-CM

## 2014-10-08 DIAGNOSIS — F1110 Opioid abuse, uncomplicated: Secondary | ICD-10-CM | POA: Insufficient documentation

## 2014-10-08 DIAGNOSIS — Z79899 Other long term (current) drug therapy: Secondary | ICD-10-CM | POA: Insufficient documentation

## 2014-10-08 DIAGNOSIS — I10 Essential (primary) hypertension: Secondary | ICD-10-CM | POA: Insufficient documentation

## 2014-10-08 DIAGNOSIS — Z87891 Personal history of nicotine dependence: Secondary | ICD-10-CM | POA: Diagnosis not present

## 2014-10-08 DIAGNOSIS — R4182 Altered mental status, unspecified: Secondary | ICD-10-CM

## 2014-10-08 DIAGNOSIS — R4781 Slurred speech: Secondary | ICD-10-CM

## 2014-10-08 DIAGNOSIS — D1800 Hemangioma unspecified site: Secondary | ICD-10-CM

## 2014-10-08 DIAGNOSIS — I6789 Other cerebrovascular disease: Secondary | ICD-10-CM | POA: Diagnosis not present

## 2014-10-08 DIAGNOSIS — Z7982 Long term (current) use of aspirin: Secondary | ICD-10-CM | POA: Insufficient documentation

## 2014-10-08 DIAGNOSIS — R55 Syncope and collapse: Secondary | ICD-10-CM

## 2014-10-08 DIAGNOSIS — R410 Disorientation, unspecified: Secondary | ICD-10-CM | POA: Diagnosis present

## 2014-10-08 LAB — URINE MICROSCOPIC-ADD ON

## 2014-10-08 LAB — COMPREHENSIVE METABOLIC PANEL
ALBUMIN: 3.5 g/dL (ref 3.5–5.0)
ALK PHOS: 74 U/L (ref 38–126)
ALT: 11 U/L — ABNORMAL LOW (ref 14–54)
ANION GAP: 9 (ref 5–15)
AST: 19 U/L (ref 15–41)
BILIRUBIN TOTAL: 0.4 mg/dL (ref 0.3–1.2)
BUN: 17 mg/dL (ref 6–20)
CALCIUM: 8.8 mg/dL — AB (ref 8.9–10.3)
CO2: 28 mmol/L (ref 22–32)
Chloride: 103 mmol/L (ref 101–111)
Creatinine, Ser: 0.82 mg/dL (ref 0.44–1.00)
GFR calc Af Amer: >60 mL/min (ref >60)
GLUCOSE: 125 mg/dL — AB (ref 65–99)
Potassium: 2.8 mmol/L — ABNORMAL LOW (ref 3.5–5.1)
Sodium: 140 mmol/L (ref 135–145)
TOTAL PROTEIN: 6.1 g/dL — AB (ref 6.5–8.1)

## 2014-10-08 LAB — RAPID URINE DRUG SCREEN, HOSP PERFORMED
Amphetamines: NOT DETECTED
BARBITURATES: NOT DETECTED
Benzodiazepines: POSITIVE — AB
Cocaine: NOT DETECTED
Opiates: POSITIVE — AB
TETRAHYDROCANNABINOL: NOT DETECTED

## 2014-10-08 LAB — CBC
HEMATOCRIT: 36.1 % (ref 36.0–46.0)
HEMOGLOBIN: 12.6 g/dL (ref 12.0–15.0)
MCH: 30.3 pg (ref 26.0–34.0)
MCHC: 34.9 g/dL (ref 30.0–36.0)
MCV: 86.8 fL (ref 78.0–100.0)
Platelets: 302 10*3/uL (ref 150–400)
RBC: 4.16 MIL/uL (ref 3.87–5.11)
RDW: 14.2 % (ref 11.5–15.5)
WBC: 8.1 10*3/uL (ref 4.0–10.5)

## 2014-10-08 LAB — DIFFERENTIAL
Basophils Absolute: 0 10*3/uL (ref 0.0–0.1)
Basophils Relative: 0 %
EOS PCT: 2 %
Eosinophils Absolute: 0.1 10*3/uL (ref 0.0–0.7)
LYMPHS ABS: 2.8 10*3/uL (ref 0.7–4.0)
LYMPHS PCT: 35 %
MONOS PCT: 6 %
Monocytes Absolute: 0.5 10*3/uL (ref 0.1–1.0)
NEUTROS PCT: 57 %
Neutro Abs: 4.7 10*3/uL (ref 1.7–7.7)

## 2014-10-08 LAB — URINALYSIS, ROUTINE W REFLEX MICROSCOPIC
BILIRUBIN URINE: NEGATIVE
Glucose, UA: NEGATIVE mg/dL
KETONES UR: NEGATIVE mg/dL
Leukocytes, UA: NEGATIVE
NITRITE: NEGATIVE
PROTEIN: NEGATIVE mg/dL
Specific Gravity, Urine: 1.01 (ref 1.005–1.030)
UROBILINOGEN UA: 1 mg/dL (ref 0.0–1.0)
pH: 6.5 (ref 5.0–8.0)

## 2014-10-08 LAB — I-STAT TROPONIN, ED: Troponin i, poc: 0 ng/mL (ref 0.00–0.08)

## 2014-10-08 LAB — GLUCOSE, POCT (MANUAL RESULT ENTRY): POC Glucose: 134 mg/dl — AB (ref 70–99)

## 2014-10-08 LAB — ETHANOL: Alcohol, Ethyl (B): <5 mg/dL (ref <5)

## 2014-10-08 LAB — PROTIME-INR
INR: 1.11 (ref 0.00–1.49)
Prothrombin Time: 14.5 seconds (ref 11.6–15.2)

## 2014-10-08 LAB — APTT: aPTT: 31 seconds (ref 24–37)

## 2014-10-08 MED ORDER — POTASSIUM CHLORIDE 10 MEQ/100ML IV SOLN
10.0000 meq | Freq: Once | INTRAVENOUS | Status: AC
  Administered 2014-10-08: 10 meq via INTRAVENOUS
  Filled 2014-10-08: qty 100

## 2014-10-08 NOTE — Progress Notes (Signed)
Subjective:    Patient ID: April Stevens, female    DOB: 11-07-1955, 59 y.o.   MRN: [id number] Chief Complaint  Patient presents with  . Dizziness    started 1 hour ago  . Extremity Weakness    arm weakness  . slurred speech    HPI 59 yo woman with PMHx of HTN and depression.  Takes her anti-hypertensive, asa, anti-depressant, vit B12 this a.m.  Thinks she might have unintentionally taken double doses of these. Was at work day normal.  A little dizzy - like she might pass out if she stood long enough.  At 2 p.m. A coworker noticed that she was acting odd with slurred speech.  Noted she was very ataxic - got her out tot he car with help and brought her here.    Unsure of med list but able to provide the pharmacy - the cma are calling for her med list.  Past Medical History  Diagnosis Date  . Depression   . Hypertension    Past Surgical History  Procedure Laterality Date  . Joint replacement     Current Outpatient Prescriptions on File Prior to Visit  Medication Sig Dispense Refill  . chlorthalidone (HYGROTON) 25 MG tablet Take 1 tablet (25 mg total) by mouth daily. 90 tablet 3  . FLUoxetine (PROZAC) 20 MG capsule Take 1 capsule (20 mg total) by mouth daily. 90 capsule 3  . ALPRAZolam (XANAX) 0.25 MG tablet Take 1 tablet (0.25 mg total) by mouth 2 (two) times daily as needed. 60 tablet 3  . aspirin 81 MG tablet Take 81 mg by mouth daily.    . potassium chloride SA (K-DUR,KLOR-CON) 20 MEQ tablet Take 1 tablet (20 mEq total) by mouth daily. Repeat blood work in 2 weeks. (Patient not taking: Reported on 10/08/2014) 7 tablet 0  . vitamin B-12 (CYANOCOBALAMIN) 100 MCG tablet Take 100 mcg by mouth daily.     No current facility-administered medications on file prior to visit.   No Known Allergies Family History  Problem Relation Age of Onset  . Hypertension Father    Social History   Social History  . Marital Status: Single    Spouse Name: N/A  . Number of Children: N/A  .  Years of Education: N/A   Social History Main Topics  . Smoking status: Former Research scientist (life sciences)  . Smokeless tobacco: None  . Alcohol Use: No  . Drug Use: No  . Sexual Activity: Yes   Other Topics Concern  . None   Social History Narrative     Review of Systems  Constitutional: Positive for activity change and fatigue.  Respiratory: Negative for chest tightness and shortness of breath.   Cardiovascular: Negative for chest pain and palpitations.  Gastrointestinal: Negative for vomiting.  Skin: Negative for color change.  Neurological: Positive for dizziness, tremors, facial asymmetry, speech difficulty, weakness, light-headedness and numbness. Negative for seizures, syncope and headaches.  Psychiatric/Behavioral: Positive for dysphoric mood. The patient is nervous/anxious.        Objective:  BP 138/80 mmHg  Pulse 72  Temp(Src) 97.6 F (36.4 C) (Oral)  Resp 16  SpO2 98%  Physical Exam  Constitutional: Vital signs are normal. She appears well-developed and well-nourished. She is active.  HENT:  Head: Normocephalic and atraumatic.  Right Ear: External ear normal.  Eyes: Conjunctivae are normal. No scleral icterus.  Pulmonary/Chest: Effort normal.  Neurological: She is alert. She is disoriented. She displays tremor. A cranial nerve deficit is present. No  sensory deficit. Coordination and gait abnormal.  Pill-rolling tremor in Lt hand + Romberg + pronator drift Decreased movement/tone of right face Poor job following commands Cannot repeat "if and or buts" Severely ataxic  Skin: Skin is warm and dry. She is not diaphoretic. No erythema.  Psychiatric: She has a normal mood and affect. Her behavior is normal.    Results for orders placed or performed in visit on 10/08/14  POCT glucose (manual entry)  Result Value Ref Range   POC Glucose 134 (A) 70 - 99 mg/dl       Assessment & Plan:  TO ER emergently - EMS contacted for emergent transport- PIV started by Lurlean Horns  -  concern for acute CVA  Delman Cheadle, MD MPH

## 2014-10-08 NOTE — ED Notes (Signed)
Per [facility], pt was at work, coworker stated that her speech was slurred, pt c/o lightheadedness and dizziness. Pt went to UC, brought here for stroke like symptoms. Pt states she possibly took twice as much as her meds this morning, unsure. No unilateral weakness, drift noted. Unsure of last seen normal, pt coworker noticed the slurred speech at 1400. No other deficits noted. VSS. CBg 140

## 2014-10-08 NOTE — ED Provider Notes (Signed)
CSN: [id number]     Arrival date & time 10/08/14  1640 History   First MD Initiated Contact with Patient 10/08/14 1640     No chief complaint on file.    (Consider location/radiation/quality/duration/timing/severity/associated sxs/prior Treatment) HPI History is difficult to obtain, patient consistently states "she can't remember". April Stevens is a 59 y.o. female with PMH significant for HTN and depression who presents with intermittent dizziness began when she woke up this morning. 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 She is unsure if she took too many of her AM medications which include Xanax, aspirin, chlorthalidone, Prozac, and potassium. She reports on her drive to work she was "all over the place".  She denies numbness, weakness, tingling, headache, vision changes, chest pain, shortness of breath, vertigo, abdominal pain, nausea, vomiting, urinary symptoms, or cough. Patient states she lives alone, denies alcohol use, tobacco use, or illicit drug use.  Her coworker states that she did not seem like the "normal Debbie". He seemed disoriented, had trouble standing, and was stumbling. He states she seemed "foggy".  Past Medical History  Diagnosis Date  . Depression   . Hypertension    Past Surgical History  Procedure Laterality Date  . Joint replacement     Family History  Problem Relation Age of Onset  . Hypertension Father    Social History  Substance Use Topics  . Smoking status: Former Research scientist (life sciences)  . Smokeless tobacco: Not on file  . Alcohol Use: No   OB History    No data available     Review of Systems All other systems negative unless otherwise stated in HPI  Allergies  Review of patient's allergies indicates no known allergies.  Home Medications   Prior to Admission medications   Medication Sig Start Date End Date Taking? Authorizing Provider  ALPRAZolam (XANAX) 0.25 MG tablet Take 1 tablet (0.25 mg  total) by mouth 2 (two) times daily as needed. 03/14/14   Thao P Le, DO  aspirin 81 MG tablet Take 81 mg by mouth daily.    Historical Provider, MD  chlorthalidone (HYGROTON) 25 MG tablet Take 1 tablet (25 mg total) by mouth daily. 03/14/14   Thao P Le, DO  diclofenac (VOLTAREN) 25 MG EC tablet Take 25 mg by mouth 2 (two) times daily.    Historical Provider, MD  FLUoxetine (PROZAC) 20 MG capsule Take 1 capsule (20 mg total) by mouth daily. 03/14/14   Thao P Le, DO  potassium chloride SA (K-DUR,KLOR-CON) 20 MEQ tablet Take 1 tablet (20 mEq total) by mouth daily. Repeat blood work in 2 weeks. Patient not taking: Reported on 10/08/2014 03/15/14   Thao P Le, DO  vitamin B-12 (CYANOCOBALAMIN) 100 MCG tablet Take 100 mcg by mouth daily.    Historical Provider, MD   There were no vitals taken for this visit. Physical Exam  Constitutional: She is oriented to person, place, and time. She appears well-developed and well-nourished.  Pt is relaxed and pleasant.   HENT:  Head: Normocephalic and atraumatic.  Mouth/Throat: Oropharynx is clear and moist.  Eyes: Conjunctivae and EOM are normal. Pupils are equal, round, and reactive to light.  Neck: Normal range of motion. Neck supple.  Cardiovascular: Normal rate and regular rhythm.   No murmur heard. Pulmonary/Chest: Effort normal and breath sounds normal. No respiratory distress. She has no wheezes. She has no rales.  Abdominal: Soft. Bowel sounds are normal. She exhibits no distension. There is  no tenderness.  Musculoskeletal: Normal range of motion.  Lymphadenopathy:    She has no cervical adenopathy.  Neurological: She is alert and oriented to person, place, and time.  Mental Status:   AOx3 Cranial Nerves:  I-not tested  II-visual acuity grossly intact, PERRLA  III, IV, VI-EOMs intact  V-temporal and masseter strength intact  VII-symmetrical facial movements intact, no facial droop  VIII-hearing grossly intact bilaterally  IX, X-gag  intact  XI-strength of sternomastoid and trapezius muscles 5/5  XII-tongue midline Motor:   Good muscle bulk and tone  Strength 5/5 bilaterally in upper and lower extremities   Cerebellar--RAMs, finger to nose intact without dysmetria or ataxia  No pronator drift Sensory:   intact     Skin: Skin is warm and dry.  Psychiatric: She has a normal mood and affect. Her behavior is normal.  Her responses are slowed, and she has trouble recounting the events of today. She is unable to provide a detailed history.     ED Course  Procedures (including critical care time) Labs Review Labs Reviewed - No data to display  Imaging Review Ct Head Wo Contrast  10/08/2014   CLINICAL DATA:  Dizziness.  Slurred speech.  Lightheadedness.  EXAM: CT HEAD WITHOUT CONTRAST  TECHNIQUE: Contiguous axial images were obtained from the base of the skull through the vertex without intravenous contrast.  COMPARISON:  04/17/2012  FINDINGS: No acute cortical infarct, hemorrhage, or mass lesion ispresent. Ventricles are of normal size. No significant extra-axial fluid collection is present. The paranasal sinuses andmastoid air cells are clear. The osseous skull is intact.  IMPRESSION: Normal brain.   Electronically Signed   By: Kerby Moors M.D.   On: 10/08/2014 19:42   I have personally reviewed and evaluated these images and lab results as part of my medical decision-making.   EKG Interpretation None      MDM   Final diagnoses:  None    Patient presents with dizziness and "fogginess".  VSS, patient appears nontoxic, NAD.  On exam, pt has difficulty providing a detailed history because she is unable to recall the events of today.  No focal neurological findings.  Concern for CVA, infectious etiology, medications.  Will consult neurology pending workup.  Imaging include CT head.  Labs include ethanol, PT/PTT, CBC, troponin, UDS, UA.   -Potassium 2.8.  Will give KCl. -CT head normal, no acute intracranial  abnormalities. -UDS positive for opiates and benzodiazepines.  7:50 PM: Upon reassessment she is along her baseline per coworker seated in the room.  Concern for posterior circulation.  Will order MRI and have her follow up with neurology.  Further care deferred to Dr. Colin Rhein at sign out.          Gloriann Loan, PA-C 10/08/14 2122  Debby Freiberg, MD 10/10/14 [phone]

## 2014-10-08 NOTE — ED Notes (Signed)
CT notified that the pt is ready for her CT scan.

## 2014-10-09 MED ORDER — GADOBENATE DIMEGLUMINE 529 MG/ML IV SOLN
20.0000 mL | Freq: Once | INTRAVENOUS | Status: AC | PRN
  Administered 2014-10-09: 18 mL via INTRAVENOUS

## 2014-10-09 NOTE — ED Notes (Signed)
MRI called, stated pt will go for another scan with contrast this time. EDP notified

## 2014-10-09 NOTE — Discharge Instructions (Signed)
Cavernous Malformation A cavernous malformation is an abnormal formation of blood vessels in the brain or spinal cord. Blood leakage (hemorrhage) from the blood vessels can occur. Cavernous malformation is a rare disorder but is more common in people of Hispanic origin. This condition is not always life-threatening.  CAUSES  Cavernous malformation is often passed down from parent to child (inherited).  SYMPTOMS  Cavernous malformation symptoms can include:   Seizures. This is due to the cavernous malformation causing irritation to the brain.  Headaches, stroke, or stroke-like symptoms. This is caused by a hemorrhage.  Nausea and vomiting. DIAGNOSIS  A cavernous malformation is diagnosed by imaging scans. These include:  Magnetic resonance imaging (MRI).  Computed tomography (CT) scans. TREATMENT  Treatment of a cavernous malformation depends on whether or not symptoms occur. Treatment options include:  Supportive care. This means treatment focuses on relieving your symptoms. A cavernous malformation does not always need treatment though it should be followed by a neurosurgeon.  Seizure medicine. Seizure medicine may be used if the cavernous malformation is the cause of seizures.  Surgery. It is important to know that surgery can be risky if the cavernous malformation is in a hard to reach area of the brain. SEEK MEDICAL CARE IF:  You have a headache that does not go away.  You have dizziness or loss of balance.  You have nausea and vomiting.  You have questions about medicine you are taking or your condition. SEEK IMMEDIATE MEDICAL CARE IF:  You may be having a stroke, or you are having stroke-like symptoms. Call yourlocal emergency services (911 in [place] immediately if you have:  Weakness or numbness of the face, arm, or leg, especially on one side of the body.  Sudden trouble seeing in one or both eyes.  Trouble talking (aphasia), or you are slurring your  words.  Sudden confusion or difficulty understanding.  You have a sudden, severe headache.  You have seizures, or your seizures are not controlled with your seizure medicine. Document Released: 09/26/2001 Document Revised: 03/29/2011 Document Reviewed: 07/09/2010 [facility] Patient Information 2015 [employer], [place]. This information is not intended to replace advice given to you by your health care provider. Make sure you discuss any questions you have with your health care provider.

## 2014-11-25 ENCOUNTER — Ambulatory Visit (INDEPENDENT_AMBULATORY_CARE_PROVIDER_SITE_OTHER): Payer: Managed Care, Other (non HMO) | Admitting: Emergency Medicine

## 2014-11-25 VITALS — BP 144/84 | HR 84 | Temp 98.8°F | Resp 16 | Ht 62.0 in | Wt 170.0 lb

## 2014-11-25 DIAGNOSIS — M7551 Bursitis of right shoulder: Secondary | ICD-10-CM | POA: Diagnosis not present

## 2014-11-25 MED ORDER — NAPROXEN SODIUM 550 MG PO TABS: 550.0000 mg | ORAL_TABLET | Freq: Two times a day (BID) | ORAL | Status: DC

## 2014-11-25 MED ORDER — TRAMADOL HCL 50 MG PO TABS: 50.0000 mg | ORAL_TABLET | Freq: Three times a day (TID) | ORAL | Status: DC | PRN

## 2014-11-25 NOTE — Patient Instructions (Signed)
Bursitis °Bursitis is inflammation and irritation of a bursa, which is one of the small, fluid-filled sacs that cushion and protect the moving parts of your body. These sacs are located between bones and muscles, muscle attachments, or skin areas next to bones. A bursa protects these structures from the wear and tear that results from frequent movement. °An inflamed bursa causes pain and swelling. Fluid may build up inside the sac. Bursitis is most common near joints, especially the knees, elbows, hips, and shoulders. °CAUSES °Bursitis can be caused by:  °· Injury from: °¨ A direct blow, like falling on your knee or elbow. °¨ Overuse of a joint (repetitive stress). °· Infection. This can happen if bacteria gets into a bursa through a cut or scrape near a joint. °· Diseases that cause joint inflammation, such as gout and rheumatoid arthritis. °RISK FACTORS °You may be at risk for bursitis if you:  °· Have a job or hobby that involves a lot of repetitive stress on your joints. °· Have a condition that weakens your body's defense system (immune system), such as diabetes, cancer, or HIV. °· Lift and reach overhead often. °· Kneel or lean on hard surfaces often. °· Run or walk often. °SIGNS AND SYMPTOMS °The most common signs and symptoms of bursitis are: °· Pain that gets worse when you move the affected body part or put weight on it. °· Inflammation. °· Stiffness. °Other signs and symptoms may include: °· Redness. °· Tenderness. °· Warmth. °· Pain that continues after rest. °· Fever and chills. This may occur in bursitis caused by infection. °DIAGNOSIS °Bursitis may be diagnosed by:  °· Medical history and physical exam. °· MRI. °· A procedure to drain fluid from the bursa with a needle (aspiration). The fluid may be checked for signs of infection or gout. °· Blood tests to rule out other causes of inflammation. °TREATMENT  °Bursitis can usually be treated at home with rest, ice, compression, and elevation (RICE). For  mild bursitis, RICE treatment may be all you need. Other treatments may include: °· Nonsteroidal anti-inflammatory drugs (NSAIDs) to treat pain and inflammation. °· Corticosteroids to fight inflammation. You may have these drugs injected into and around the area of bursitis. °· Aspiration of bursitis fluid to relieve pain and improve movement. °· Antibiotic medicine to treat an infected bursa. °· A splint, brace, or walking aid. °· Physical therapy if you continue to have pain or limited movement. °· Surgery to remove a damaged or infected bursa. This may be needed if you have a very bad case of bursitis or if other treatments have not worked. °HOME CARE INSTRUCTIONS  °· Take medicines only as directed by your health care provider. °· If you were prescribed an antibiotic medicine, finish it all even if you start to feel better. °· Rest the affected area as directed by your health care provider. °¨ Keep the area elevated. °¨ Avoid activities that make pain worse. °· Apply ice to the injured area: °¨ Place ice in a plastic bag. °¨ Place a towel between your skin and the bag. °¨ Leave the ice on for 20 minutes, 2-3 times a day. °· Use splints, braces, pads, or walking aids as directed by your health care provider. °· Keep all follow-up visits as directed by your health care provider. This is important. °PREVENTION  °· Wear knee pads if you kneel often. °· Wear sturdy running or walking shoes that fit you well. °· Take regular breaks from repetitive activity. °· Warm   up by stretching before doing any strenuous activity. °· Maintain a healthy weight or lose weight as recommended by your health care provider. Ask your health care provider if you need help. °· Exercise regularly. Start any new physical activity gradually. °SEEK MEDICAL CARE IF:  °· Your bursitis is not responding to treatment or home care. °· You have a fever. °· You have chills. °  °This information is not intended to replace advice given to you by your  health care provider. Make sure you discuss any questions you have with your health care provider. °  °Document Released: 01/02/2000 Document Revised: 09/25/2014 Document Reviewed: 03/26/2013 °[employer]vier Interactive Patient Education ©2016 [employer] ° °

## 2014-11-25 NOTE — Progress Notes (Signed)
Subjective:  Patient ID: April Stevens, female    DOB: Sep 10, 1955  Age: 59 y.o. MRN: [id number]  CC: Shoulder Pain   HPI April Stevens presents   With pain in her right shoulder. She's been having pain for about 2 weeks. She denies any history of injury or overuse. 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 She's unable lay on that right side at night due to pain. Range of motion of theright shoulder. Denies any other complaints. Has had no history of prior injury to shoulder and she's had no improvement with over-the-counter medication.  History April Stevens has a past medical history of Depression and Hypertension.   She has past surgical history that includes Joint replacement.   Her  family history includes Hypertension in her father.  She   reports that she has quit smoking. She has never used smokeless tobacco. She reports that she does not drink alcohol or use illicit drugs.  Outpatient Prescriptions Prior to Visit  Medication Sig Dispense Refill  . ALPRAZolam (XANAX) 0.25 MG tablet Take 1 tablet (0.25 mg total) by mouth 2 (two) times daily as needed. (Patient taking differently: Take 0.25 mg by mouth 2 (two) times daily as needed for anxiety. ) 60 tablet 3  . aspirin 81 MG tablet Take 81 mg by mouth daily.    . chlorthalidone (HYGROTON) 25 MG tablet Take 1 tablet (25 mg total) by mouth daily. 90 tablet 3  . diclofenac (VOLTAREN) 25 MG EC tablet Take 25 mg by mouth daily as needed. For knee pain per patient    . FLUoxetine (PROZAC) 20 MG capsule Take 1 capsule (20 mg total) by mouth daily. 90 capsule 3  . potassium chloride SA (K-DUR,KLOR-CON) 20 MEQ tablet Take 1 tablet (20 mEq total) by mouth daily. Repeat blood work in 2 weeks. (Patient taking differently: Take 20 mEq by mouth daily. ) 7 tablet 0  . vitamin B-12 (CYANOCOBALAMIN) 100 MCG tablet Take 100 mcg by mouth daily.     No facility-administered medications prior to visit.     Social History   Social History  . Marital Status: Single    Spouse Name: N/A  . Number of Children: N/A  . Years of Education: N/A   Social History Main Topics  . Smoking status: Former Research scientist (life sciences)  . Smokeless tobacco: Never Used  . Alcohol Use: No  . Drug Use: No  . Sexual Activity: Yes   Other Topics Concern  . None   Social History Narrative     Review of Systems  Constitutional: Negative for fever, chills and appetite change.  HENT: Negative for congestion, ear pain, postnasal drip, sinus pressure and sore throat.   Eyes: Negative for pain and redness.  Respiratory: Negative for cough, shortness of breath and wheezing.   Cardiovascular: Negative for leg swelling.  Gastrointestinal: Negative for nausea, vomiting, abdominal pain, diarrhea, constipation and blood in stool.  Endocrine: Negative for polyuria.  Genitourinary: Negative for dysuria, urgency, frequency and flank pain.  Musculoskeletal: Positive for arthralgias (right shoulder). Negative for gait problem.  Skin: Negative for rash.  Neurological: Negative for weakness and headaches.  Psychiatric/Behavioral: Negative for confusion and decreased concentration. The patient is not nervous/anxious.     Objective:  BP 144/84 mmHg  Pulse 84  Temp(Src) 98.8 F (37.1 C) (Oral)  Resp 16  Ht 5\' 2"  (1.575 m)  Wt 170 lb (77.111 kg)  BMI 31.09 kg/m2  SpO2  97%  Physical Exam  Constitutional: She is oriented to person, place, and time. She appears well-developed and well-nourished.  HENT:  Head: Normocephalic and atraumatic.  Eyes: Conjunctivae are normal. Pupils are equal, round, and reactive to light.  Pulmonary/Chest: Effort normal.  Musculoskeletal: She exhibits no edema.       Right shoulder: She exhibits decreased range of motion and tenderness.  Neurological: She is alert and oriented to person, place, and time.  Skin: Skin is dry.  Psychiatric: She has a normal mood and affect. Her behavior is normal.  Thought content normal.      Assessment & Plan:   April Stevens was seen today for shoulder pain.  Diagnoses and all orders for this visit:  Shoulder bursitis, right  Other orders -     naproxen sodium (ANAPROX DS) 550 MG tablet; Take 1 tablet (550 mg total) by mouth 2 (two) times daily with a meal. -     traMADol (ULTRAM) 50 MG tablet; Take 1 tablet (50 mg total) by mouth every 8 (eight) hours as needed.  I have discontinued Ms. Fodor's vitamin B-12. I am also having her start on naproxen sodium and traMADol. Additionally, I am having her maintain her aspirin, FLUoxetine, chlorthalidone, ALPRAZolam, potassium chloride SA, and diclofenac.  Meds ordered this encounter  Medications  . naproxen sodium (ANAPROX DS) 550 MG tablet    Sig: Take 1 tablet (550 mg total) by mouth 2 (two) times daily with a meal.    Dispense:  40 tablet    Refill:  0  . traMADol (ULTRAM) 50 MG tablet    Sig: Take 1 tablet (50 mg total) by mouth every 8 (eight) hours as needed.    Dispense:  30 tablet    Refill:  0      she was offered and refused an injection in the shoulder bursa said that she rather try pills of that that works return  Appropriate red flag conditions were discussed with the patient as well as actions that should be taken.  Patient expressed his understanding.  Follow-up: Return if symptoms worsen or fail to improve.  Roselee Culver, MD

## 2014-12-13 ENCOUNTER — Other Ambulatory Visit: Payer: Self-pay | Admitting: Emergency Medicine

## 2014-12-16 NOTE — Telephone Encounter (Signed)
Dr Anderson, do you want to give pt RFs? 

## 2014-12-21 ENCOUNTER — Ambulatory Visit (INDEPENDENT_AMBULATORY_CARE_PROVIDER_SITE_OTHER): Payer: Managed Care, Other (non HMO) | Admitting: Emergency Medicine

## 2014-12-21 DIAGNOSIS — M7521 Bicipital tendinitis, right shoulder: Secondary | ICD-10-CM

## 2014-12-21 MED ORDER — NAPROXEN SODIUM 550 MG PO TABS: 550.0000 mg | ORAL_TABLET | Freq: Two times a day (BID) | ORAL | Status: DC

## 2014-12-21 MED ORDER — METHYLPREDNISOLONE ACETATE 80 MG/ML IJ SUSP: 80.0000 mg | Freq: Once | INTRAMUSCULAR | Status: DC

## 2014-12-21 MED ORDER — TRAMADOL HCL 50 MG PO TABS: 50.0000 mg | ORAL_TABLET | Freq: Three times a day (TID) | ORAL | Status: DC | PRN

## 2014-12-21 NOTE — Progress Notes (Signed)
Subjective:  Patient ID: April Stevens, female    DOB: 1955/06/03  Age: 59 y.o. MRN: [id number]  CC: Follow-up   HPI AVRYL SINIBALDI presents  He was seen a month ago with pain in her right shoulder. She is put on Anaprox and had no improvement. She thinks she's injured her arm pullinglaundry out of washing machine.this to repetitive activity for her. She has marked pain with  abduction of her shoulder. She has she has no numbness tingling or weakness in arm no shoulder pain.  History Lennon has a past medical history of Depression and Hypertension.   She has past surgical history that includes Joint replacement.   Her  family history includes Hypertension in her father.  She   reports that she has quit smoking. She has never used smokeless tobacco. She reports that she does not drink alcohol or use illicit drugs.  Outpatient Prescriptions Prior to Visit  Medication Sig Dispense Refill  . ALPRAZolam (XANAX) 0.25 MG tablet Take 1 tablet (0.25 mg total) by mouth 2 (two) times daily as needed. (Patient taking differently: Take 0.25 mg by mouth 2 (two) times daily as needed for anxiety. ) 60 tablet 3  . aspirin 81 MG tablet Take 81 mg by mouth daily.    . chlorthalidone (HYGROTON) 25 MG tablet Take 1 tablet (25 mg total) by mouth daily. 90 tablet 3  . diclofenac (VOLTAREN) 25 MG EC tablet Take 25 mg by mouth daily as needed. For knee pain per patient    . FLUoxetine (PROZAC) 20 MG capsule Take 1 capsule (20 mg total) by mouth daily. 90 capsule 3  . potassium chloride SA (K-DUR,KLOR-CON) 20 MEQ tablet Take 1 tablet (20 mEq total) by mouth daily. Repeat blood work in 2 weeks. (Patient taking differently: Take 20 mEq by mouth daily. ) 7 tablet 0  . naproxen sodium (ANAPROX) 550 MG tablet TAKE 1 TABLET (550 MG TOTAL) BY MOUTH 2 (TWO) TIMES DAILY WITH A MEAL. (Patient not taking: Reported on 12/21/2014) 180 tablet 1  . traMADol (ULTRAM) 50 MG tablet Take 1 tablet (50 mg total) by mouth every 8  (eight) hours as needed. (Patient not taking: Reported on 12/21/2014) 30 tablet 0   No facility-administered medications prior to visit.    Social History   Social History  . Marital Status: Single    Spouse Name: N/A  . Number of Children: N/A  . Years of Education: N/A   Social History Main Topics  . Smoking status: Former Research scientist (life sciences)  . Smokeless tobacco: Never Used  . Alcohol Use: No  . Drug Use: No  . Sexual Activity: Yes   Other Topics Concern  . Not on file   Social History Narrative     Review of Systems  Constitutional: Negative for fever, chills and appetite change.  HENT: Negative for congestion, ear pain, postnasal drip, sinus pressure and sore throat.   Eyes: Negative for pain and redness.  Respiratory: Negative for cough, shortness of breath and wheezing.   Cardiovascular: Negative for leg swelling.  Gastrointestinal: Negative for nausea, vomiting, abdominal pain, diarrhea, constipation and blood in stool.  Endocrine: Negative for polyuria.  Genitourinary: Negative for dysuria, urgency, frequency and flank pain.  Musculoskeletal: Positive for myalgias. Negative for gait problem.  Skin: Negative for rash.  Neurological: Negative for weakness and headaches.  Psychiatric/Behavioral: Negative for confusion and decreased concentration. The patient is not nervous/anxious.     Objective:  There were no vitals taken for this  visit.  Physical Exam  Constitutional: She is oriented to person, place, and time. She appears well-developed and well-nourished. No distress.  HENT:  Head: Normocephalic and atraumatic.  Right Ear: External ear normal.  Left Ear: External ear normal.  Nose: Nose normal.  Eyes: Conjunctivae and EOM are normal. Pupils are equal, round, and reactive to light. No scleral icterus.  Neck: Normal range of motion. Neck supple. No tracheal deviation present.  Cardiovascular: Normal rate, regular rhythm and normal heart sounds.   Pulmonary/Chest:  Effort normal. No respiratory distress. She has no wheezes. She has no rales.  Abdominal: She exhibits no mass. There is no tenderness. There is no rebound and no guarding.  Musculoskeletal: She exhibits no edema.       Right upper arm: She exhibits tenderness.  Speeds and yergeson's positive  Lymphadenopathy:    She has no cervical adenopathy.  Neurological: She is alert and oriented to person, place, and time. Coordination normal.  Skin: Skin is warm and dry. No rash noted.  Psychiatric: She has a normal mood and affect. Her behavior is normal.      Assessment & Plan:   Peneloperose was seen today for follow-up.  Diagnoses and all orders for this visit:  Biceps tendonitis, right -     methylPREDNISolone acetate (DEPO-MEDROL) injection 80 mg; Inject 1 mL (80 mg total) into the muscle once.  Other orders -     traMADol (ULTRAM) 50 MG tablet; Take 1 tablet (50 mg total) by mouth every 8 (eight) hours as needed. -     naproxen sodium (ANAPROX) 550 MG tablet; Take 1 tablet (550 mg total) by mouth 2 (two) times daily with a meal.  I am having Ms. Danish maintain her aspirin, FLUoxetine, chlorthalidone, ALPRAZolam, potassium chloride SA, diclofenac, traMADol, and naproxen sodium. We will continue to administer methylPREDNISolone acetate.  Meds ordered this encounter  Medications  . traMADol (ULTRAM) 50 MG tablet    Sig: Take 1 tablet (50 mg total) by mouth every 8 (eight) hours as needed.    Dispense:  30 tablet    Refill:  0  . naproxen sodium (ANAPROX) 550 MG tablet    Sig: Take 1 tablet (550 mg total) by mouth 2 (two) times daily with a meal.    Dispense:  180 tablet    Refill:  0  . methylPREDNISolone acetate (DEPO-MEDROL) injection 80 mg    Sig:    Injected with depo medrol 80 and lidocaine with improved ROM>   Appropriate red flag conditions were discussed with the patient as well as actions that should be taken.  Patient expressed his understanding.  Follow-up: Return if  symptoms worsen or fail to improve.  Roselee Culver, MD

## 2014-12-21 NOTE — Patient Instructions (Signed)
Bicipital Tendonitis Bicipital tendonitis refers to redness, soreness, and swelling (inflammation) or irritation of the bicep tendon. The biceps muscle is located between the elbow and shoulder of the inner arm. The tendon heads, similar to pieces of rope, connect the bicep muscle to the shoulder socket. They are called short head and long head tendons. When tendonitis occurs, the long head tendon is inflamed and swollen, and may be thickened or partially torn.  Bicipital tendonitis can occur with other problems as well, such as arthritis in the shoulder or acromioclavicular joints, tears in the tendons, or other rotator cuff problems.  CAUSES  Overuse of of the arms for overhead activities is the major cause of tendonitis. Many athletes, such as swimmers, baseball players, and tennis players are prone to bicipital tendonitis. Jobs that require manual labor or routine chores, especially chores involving overhead activities can result in overuse and tendonitis. SYMPTOMS Symptoms may include:  Pain in and around the front of the shoulder. Pain may be worse with overhead motion.  Pain or aching that radiates down the arm.  Clicking or shifting sensations in the shoulder. DIAGNOSIS Your caregiver may perform the following:  Physical exam and tests of the biceps and shoulder to observe range of motion, strength, and stability.  X-rays or magnetic resonance imaging (MRI) to confirm the diagnosis. In most common cases, these tests are not necessary. Since other problems may exist in the shoulder or rotator cuff, additional tests may be recommended. TREATMENT Treatment may include the following:  Medications  Your caregiver may prescribe over-the-counter pain relievers.  Steroid injections, such as cortisone, may be recommended. These may help to reduce inflammation and pain.  Physical Therapy - Your caregiver may recommend gentle exercises with the arm. These can help restore strength and range  of motion. They may be done at home or with a physical therapist's supervision and input.  Surgery - Arthroscopic or open surgery sometimes is necessary. Surgery may include:  Reattachment or repair of the tendon at the shoulder socket.  Removal of the damaged section of the tendon.  Anchoring the tendon to a different area of the shoulder (tenodesis). HOME CARE INSTRUCTIONS   Avoid overhead motion of the affected arm or any other motion that causes pain.  Take medication for pain as directed. Do not take these for more than 3 weeks, unless directed to do so by your caregiver.  Ice the affected area for 20 minutes at a time, 3-4 times per day. Place a towel on the skin over the painful area and the ice or cold pack over the towel. Do not place ice directly on the skin.  Perform gentle exercises at home as directed. These will increase strength and flexibility. PREVENTION  Modify your activities as much as possible to protect your arm. A physical therapist or sports medicine physician can help you understand options for safe motion.  Avoid repetitive overhead pulling, lifting, reaching, and throwing until your caregiver tells you it is ok to resume these activities. SEEK MEDICAL CARE IF:  Your pain worsens.  You have difficulty moving the affected arm.  You have trouble performing any of the self-care instructions. MAKE SURE YOU:   Understand these instructions.  Will watch your condition.  Will get help right away if you are not doing well or get worse.   This information is not intended to replace advice given to you by your health care provider. Make sure you discuss any questions you have with your health care provider.  Document Released: 02/06/2010 Document Revised: 03/29/2011 Document Reviewed: 07/24/2014 Elsevier Interactive Patient Education 2016 [employer]  

## 2015-02-16 ENCOUNTER — Ambulatory Visit (INDEPENDENT_AMBULATORY_CARE_PROVIDER_SITE_OTHER): Payer: Managed Care, Other (non HMO)

## 2015-02-16 ENCOUNTER — Ambulatory Visit (INDEPENDENT_AMBULATORY_CARE_PROVIDER_SITE_OTHER): Payer: Managed Care, Other (non HMO) | Admitting: Internal Medicine

## 2015-02-16 VITALS — BP 150/74 | HR 98 | Temp 99.4°F | Resp 16 | Ht 62.0 in | Wt 176.0 lb

## 2015-02-16 DIAGNOSIS — S40021A Contusion of right upper arm, initial encounter: Secondary | ICD-10-CM

## 2015-02-16 DIAGNOSIS — S40011A Contusion of right shoulder, initial encounter: Secondary | ICD-10-CM

## 2015-02-16 DIAGNOSIS — IMO0001 Reserved for inherently not codable concepts without codable children: Secondary | ICD-10-CM

## 2015-02-16 MED ORDER — OXYCODONE-ACETAMINOPHEN 10-325 MG PO TABS: 1.0000 | ORAL_TABLET | Freq: Three times a day (TID) | ORAL | Status: DC | PRN

## 2015-02-16 MED ORDER — MELOXICAM 15 MG PO TABS: 15.0000 mg | ORAL_TABLET | Freq: Every day | ORAL | Status: DC
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
## 2015-02-16 NOTE — Patient Instructions (Signed)
Because you received an x-ray today, you will receive an invoice from [facility]. Please contact [facility] at [phone] with questions or concerns regarding your invoice. Our billing staff will not be able to assist you with those questions. °

## 2015-02-28 ENCOUNTER — Telehealth: Payer: Self-pay

## 2015-02-28 DIAGNOSIS — M25511 Pain in right shoulder: Secondary | ICD-10-CM

## 2015-02-28 NOTE — Telephone Encounter (Signed)
Pt is calling to state that the shot did not help her shoulder and she would like to know what to do next   Best number [phone]

## 2015-03-03 NOTE — Telephone Encounter (Signed)
Spoke with patient; has been suffering with shoulder pain since 11/2014.  S/p three visits for shoulder pain; last evaluated by Dr. Laney Pastor on 02/16/15 after an acute injury to the R shoulder with persistent pain and no improvement.  S/p xrays during visit.  Wants to know next step.  A/P:  Persistent R shoulder pain:  S/p xrays and s/p steroid injection in past three months. Refer to ortho placed. Pt agreeable. To Dr. Craig Guess.

## 2015-03-09 ENCOUNTER — Other Ambulatory Visit: Payer: Self-pay | Admitting: Internal Medicine

## 2015-03-20 ENCOUNTER — Other Ambulatory Visit: Payer: Self-pay | Admitting: Family Medicine

## 2015-05-20 ENCOUNTER — Other Ambulatory Visit: Payer: Self-pay | Admitting: Family Medicine

## 2015-05-20 NOTE — Telephone Encounter (Signed)
One more refill called in needs office visit

## 2015-07-01 ENCOUNTER — Telehealth: Payer: Self-pay

## 2015-07-01 NOTE — Telephone Encounter (Signed)
Pharm reqs Rf of prozac. Pt needs f/up. Called her and LM asking her to call back with f/up plan.

## 2015-07-29 ENCOUNTER — Other Ambulatory Visit: Payer: Self-pay | Admitting: Internal Medicine

## 2015-07-31 NOTE — Telephone Encounter (Signed)
I had called pt last month and LMOM for her to RTC or call me w/follow up plan and never got a return call from the pt. Now we are getting another request this month. I LMOM for pt again, to call back or RTC if she needs this med from [place]. I will send back to pharm w/note also. They may be sending this to the wrong office. Pt may have changed providers.

## 2016-03-08 ENCOUNTER — Ambulatory Visit (INDEPENDENT_AMBULATORY_CARE_PROVIDER_SITE_OTHER): Payer: Managed Care, Other (non HMO) | Admitting: Family Medicine

## 2016-03-08 ENCOUNTER — Ambulatory Visit (INDEPENDENT_AMBULATORY_CARE_PROVIDER_SITE_OTHER): Payer: Managed Care, Other (non HMO)

## 2016-03-08 VITALS — BP 130/68 | HR 70 | Temp 98.4°F | Resp 18 | Ht 62.0 in | Wt 188.4 lb

## 2016-03-08 DIAGNOSIS — R05 Cough: Secondary | ICD-10-CM

## 2016-03-08 DIAGNOSIS — F321 Major depressive disorder, single episode, moderate: Secondary | ICD-10-CM | POA: Diagnosis not present

## 2016-03-08 DIAGNOSIS — Z131 Encounter for screening for diabetes mellitus: Secondary | ICD-10-CM | POA: Diagnosis not present

## 2016-03-08 DIAGNOSIS — E876 Hypokalemia: Secondary | ICD-10-CM | POA: Diagnosis not present

## 2016-03-08 DIAGNOSIS — F419 Anxiety disorder, unspecified: Secondary | ICD-10-CM

## 2016-03-08 DIAGNOSIS — I10 Essential (primary) hypertension: Secondary | ICD-10-CM

## 2016-03-08 DIAGNOSIS — R059 Cough, unspecified: Secondary | ICD-10-CM

## 2016-03-08 DIAGNOSIS — R635 Abnormal weight gain: Secondary | ICD-10-CM

## 2016-03-08 MED ORDER — ALPRAZOLAM 0.25 MG PO TABS: 0.2500 mg | ORAL_TABLET | Freq: Two times a day (BID) | ORAL | 3 refills | Status: DC | PRN

## 2016-03-08 MED ORDER — LORATADINE 10 MG PO TABS: 10.0000 mg | ORAL_TABLET | Freq: Every day | ORAL | 11 refills | Status: DC

## 2016-03-08 MED ORDER — CHLORTHALIDONE 25 MG PO TABS: ORAL_TABLET | ORAL | 1 refills | Status: DC

## 2016-03-08 MED ORDER — FLUOXETINE HCL 40 MG PO CAPS: 40.0000 mg | ORAL_CAPSULE | Freq: Every day | ORAL | 1 refills | Status: DC

## 2016-03-08 NOTE — Patient Instructions (Signed)
     IF you received an x-ray today, you will receive an invoice from [facility]. Please contact [facility] at [phone] with questions or concerns regarding your invoice.   IF you received labwork today, you will receive an invoice from [facility]. Please contact [facility] at [phone] with questions or concerns regarding your invoice.   Our billing staff will not be able to assist you with questions regarding bills from these companies.  You will be contacted with the lab results as soon as they are available. The fastest way to get your results is to activate your My Chart account. Instructions are located on the last page of this paperwork. If you have not heard from us regarding the results in 2 weeks, please contact this office.     

## 2016-03-08 NOTE — Progress Notes (Signed)
Chief Complaint  Patient presents with  . Medication Refill    Hygroton, Prozac and up the mg on the Xanax  . Cough    x1 year    HPI Patient has a history of hypertension BP Readings from Last 3 Encounters:  03/08/16 130/68  02/16/15 (!) 150/74  11/25/14 (!) 144/84    She took her chlorthalidone She reports that she has been on chlorthalidone for years She denies side effects Her problem list shows hypokalemia She denies muscle cramps, dizziness   Obesity She has been working second shift and gets home at midnight She reports that she has a hard time with her food choices.  She reports that she eats at night and this is the difficulty. She wakes up at 6am to get her granddaughter and typically gets ready for work  She reports that she has a small bowl of cereal before work.   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 Depression   . Hypertension     Current Outpatient Prescriptions  Medication Sig Dispense Refill  . ALPRAZolam (XANAX) 0.25 MG tablet Take 1 tablet (0.25 mg total) by mouth 2 (two) times daily as needed for anxiety (panic attacks). 30 tablet 3  . aspirin 81 MG tablet Take 81 mg by mouth daily.    . chlorthalidone (HYGROTON) 25 MG tablet TAKE 1 TABLET (25 MG TOTAL) BY MOUTH DAILY. 90 tablet  1  . FLUoxetine (PROZAC) 40 MG capsule Take 1 capsule (40 mg total) by mouth daily. 90 capsule 1  . HYDROcodone-acetaminophen (NORCO/VICODIN) 5-325 MG tablet Take 1 tablet by mouth every 6 (six) hours as needed for moderate pain.    [name] loratadine (CLARITIN) 10 MG tablet Take 1 tablet (10 mg total) by mouth daily. 30 tablet 11  . oxyCODONE-acetaminophen (PERCOCET) 10-325 MG tablet Take 1 tablet by mouth every 8 (eight) hours as needed for pain. (Patient not taking: Reported on 03/08/2016) 10 tablet 0   Current Facility-Administered Medications  Medication Dose Route Frequency Provider Last Rate Last Dose  . methylPREDNISolone acetate (DEPO-MEDROL) injection 80 mg  80 mg Intramuscular Once Roselee Culver, MD        Allergies: No Known Allergies  Past Surgical History:  Procedure Laterality Date  . JOINT REPLACEMENT    . SHOULDER SURGERY      Social History   Social History  . Marital status: Single    Spouse name: N/A  . Number of children: N/A  . Years of education: N/A   Social History Main Topics  . Smoking status: Former Research scientist (life sciences)  . Smokeless tobacco: Never Used  . Alcohol use No  . Drug use: No  . Sexual activity: Yes   Other Topics Concern  .  None   Social History Narrative  . None    ROS  Objective: Vitals:   03/08/16 1209 03/08/16 1234  BP: (!) 158/80 130/68  Pulse: 70   Resp: 18   Temp: 98.4 F (36.9 C)   TempSrc: Oral   SpO2: 95%   Weight: 188 lb 6.4 oz (85.5 kg)   Height: 5\' 2"  (1.575 m)     Physical Exam  Constitutional: She is oriented to person, place, and time. She appears well-developed and well-nourished.  HENT:  Head: Normocephalic and atraumatic.  Right Ear: External ear normal.  Left Ear: External ear normal.  Mouth/Throat: Oropharynx is clear and moist.  Eyes: Conjunctivae and EOM are normal. Right eye exhibits no discharge. Left eye exhibits no discharge.  Neck: Normal range of motion. Neck supple.  Cardiovascular: Normal rate,  regular rhythm and normal heart sounds.   No murmur heard. Pulmonary/Chest: Effort normal and breath sounds normal. No respiratory distress. She has no wheezes.  Abdominal: Soft. Bowel sounds are normal. She exhibits no distension. There is no tenderness.  Musculoskeletal: Normal range of motion. She exhibits no edema.  Neurological: She is alert and oriented to person, place, and time.  Skin: Skin is warm. Capillary refill takes less than 2 seconds. No erythema.      CLINICAL DATA:  Chronic cough  EXAM: CHEST  2 VIEW  COMPARISON:  November 11, 2009  FINDINGS: There is no edema or consolidation. Heart size and pulmonary vascularity are normal. No adenopathy. There is atherosclerotic calcification in the aorta. No bone lesions.  IMPRESSION: Aortic atherosclerosis.  No edema or consolidation.   Electronically Signed   By: Lowella Grip III M.D.   On: 03/08/2016 12:51  Assessment and Plan Gracin was seen today for medication refill and cough.  Diagnoses and all orders for this visit:  Essential hypertension- bp at goal after recheck  Advised pt to follow up if bp still not at goal at home and will consider lisinopril or amlodipine for added coverage -     Comprehensive metabolic panel -     Lipid panel -     Hemoglobin A1c -     chlorthalidone (HYGROTON) 25 MG tablet; TAKE 1 TABLET (25 MG TOTAL) BY MOUTH DAILY.  Hypokalemia- will check cmp for K derangements  Moderate single current episode of major depressive disorder (HCC) Anxiety -   Advised pt to increase her dose of prozac to help with her anxiety and depression symptoms -     ALPRAZolam (XANAX) 0.25 MG tablet; Take 1 tablet (0.25 mg total) by mouth 2 (two) times daily as needed for anxiety (panic attacks). -     FLUoxetine (PROZAC) 40 MG capsule; Take 1 capsule (40 mg total) by mouth daily.   Weight gain Screening for diabetes mellitus Given weight gain will screen for diabetes -     Hemoglobin  A1c  Cough- advised to take an antihistamine as her cough is probably due to the carpet and allergens in the hospital  -     DG Chest 2 View  Other orders -     loratadine (CLARITIN) 10 MG tablet; Take 1 tablet (10 mg total) by mouth daily.  A total of 40 minutes were spent face-to-face with the patient during this encounter and over half of that time was spent on counseling and coordination of care.    [place]

## 2016-03-09 LAB — COMPREHENSIVE METABOLIC PANEL
A/G RATIO: 1.8 (ref 1.2–2.2)
ALT: 13 IU/L (ref 0–32)
AST: 18 IU/L (ref 0–40)
Albumin: 4.4 g/dL (ref 3.6–4.8)
Alkaline Phosphatase: 99 IU/L (ref 39–117)
BILIRUBIN TOTAL: 0.7 mg/dL (ref 0.0–1.2)
BUN/Creatinine Ratio: 18 (ref 12–28)
BUN: 14 mg/dL (ref 8–27)
CHLORIDE: 98 mmol/L (ref 96–106)
CO2: 27 mmol/L (ref 18–29)
Calcium: 9.4 mg/dL (ref 8.7–10.3)
Creatinine, Ser: 0.77 mg/dL (ref 0.57–1.00)
GFR calc Af Amer: 96 mL/min/{1.73_m2} (ref >59)
GFR calc non Af Amer: 83 mL/min/{1.73_m2} (ref >59)
Globulin, Total: 2.4 g/dL (ref 1.5–4.5)
Glucose: 116 mg/dL — ABNORMAL HIGH (ref 65–99)
POTASSIUM: 3.2 mmol/L — AB (ref 3.5–5.2)
Sodium: 144 mmol/L (ref 134–144)
TOTAL PROTEIN: 6.8 g/dL (ref 6.0–8.5)

## 2016-03-09 LAB — LIPID PANEL
Chol/HDL Ratio: 5.4 ratio units — ABNORMAL HIGH (ref 0.0–4.4)
Cholesterol, Total: 252 mg/dL — ABNORMAL HIGH (ref 100–199)
HDL: 47 mg/dL (ref >39)
LDL Calculated: 142 mg/dL — ABNORMAL HIGH (ref 0–99)
Triglycerides: 316 mg/dL — ABNORMAL HIGH (ref 0–149)
VLDL Cholesterol Cal: 63 mg/dL — ABNORMAL HIGH (ref 5–40)

## 2016-03-09 LAB — HEMOGLOBIN A1C
ESTIMATED AVERAGE GLUCOSE: 114 mg/dL
Hgb A1c MFr Bld: 5.6 % (ref 4.8–5.6)

## 2016-04-07 ENCOUNTER — Ambulatory Visit: Payer: Managed Care, Other (non HMO) | Admitting: Family Medicine

## 2016-05-06 ENCOUNTER — Encounter: Payer: Self-pay | Admitting: Radiology

## 2016-05-12 ENCOUNTER — Telehealth: Payer: Self-pay | Admitting: Family Medicine

## 2016-05-12 NOTE — Telephone Encounter (Signed)
[place] FEB. 2018. SHE RECEIVED A LETTER STATING THAT WE HAVE BEEN TRYING TO GET IN TOUCH WITH HER REGARDING HER LAB RESULTS. PATIENT SAID HER PHONE NUMBER IS CORRECT, BUT SHE NEEDS [place] TO CALL HER BETWEEN 9:00 AM - 12:00 NOON. WHEN SHE GOES TO WORK SHE CAN NOT HAVE HER CELL PHONE AND SHE DOES NOT GET OFF UNTIL 11:00 PM. PLEASE TRY TO CALL HER BEFORE SHE GOES TO WORK. BEST PHONE [phone] (CELL) PHARMACY CHOICE IS [facility] ON [address]. [place]

## 2016-05-13 NOTE — Telephone Encounter (Signed)
Pt advised and tx up front for visit

## 2016-05-21 ENCOUNTER — Ambulatory Visit: Payer: Managed Care, Other (non HMO) | Admitting: Family Medicine

## 2016-08-13 ENCOUNTER — Ambulatory Visit (INDEPENDENT_AMBULATORY_CARE_PROVIDER_SITE_OTHER): Payer: Managed Care, Other (non HMO) | Admitting: Physician Assistant

## 2016-08-13 VITALS — BP 138/74 | HR 71 | Temp 98.3°F | Resp 16 | Ht 62.0 in | Wt 185.2 lb

## 2016-08-13 DIAGNOSIS — M545 Low back pain, unspecified: Secondary | ICD-10-CM

## 2016-08-13 MED ORDER — NAPROXEN 500 MG PO TABS: 500.0000 mg | ORAL_TABLET | Freq: Two times a day (BID) | ORAL | 0 refills | Status: DC

## 2016-08-13 MED ORDER — CYCLOBENZAPRINE HCL 10 MG PO TABS: 5.0000 mg | ORAL_TABLET | Freq: Three times a day (TID) | ORAL | 0 refills | Status: DC | PRN
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
## 2016-08-13 NOTE — Patient Instructions (Signed)
Okay to take 1000 mg of tylenol on top of prescription only if needed.

## 2016-08-29 ENCOUNTER — Other Ambulatory Visit: Payer: Self-pay | Admitting: Physician Assistant

## 2016-08-29 DIAGNOSIS — M545 Low back pain, unspecified: Secondary | ICD-10-CM

## 2016-09-01 ENCOUNTER — Other Ambulatory Visit: Payer: Self-pay | Admitting: Family Medicine

## 2016-09-01 NOTE — Telephone Encounter (Signed)
Lab Results  Component Value Date   CREATININE 0.77 03/08/2016

## 2016-09-13 ENCOUNTER — Other Ambulatory Visit: Payer: Self-pay | Admitting: Family Medicine

## 2016-09-26 ENCOUNTER — Other Ambulatory Visit: Payer: Self-pay | Admitting: Physician Assistant

## 2016-09-26 DIAGNOSIS — M545 Low back pain, unspecified: Secondary | ICD-10-CM

## 2016-10-23 ENCOUNTER — Other Ambulatory Visit: Payer: Self-pay | Admitting: Family Medicine

## 2016-12-07 ENCOUNTER — Other Ambulatory Visit: Payer: Self-pay | Admitting: Family Medicine

## 2016-12-18 ENCOUNTER — Other Ambulatory Visit: Payer: Self-pay | Admitting: Family Medicine

## 2016-12-22 ENCOUNTER — Other Ambulatory Visit: Payer: Self-pay | Admitting: Family Medicine

## 2016-12-22 NOTE — Telephone Encounter (Signed)
Patient is scheduled appointment 12/7- ok to RF?

## 2016-12-24 ENCOUNTER — Other Ambulatory Visit: Payer: Self-pay

## 2016-12-24 ENCOUNTER — Encounter: Payer: Self-pay | Admitting: Physician Assistant

## 2016-12-24 ENCOUNTER — Ambulatory Visit: Payer: Managed Care, Other (non HMO) | Admitting: Physician Assistant

## 2016-12-24 VITALS — BP 138/70 | HR 84 | Temp 98.5°F | Resp 16 | Ht 62.0 in | Wt 185.0 lb

## 2016-12-24 DIAGNOSIS — I10 Essential (primary) hypertension: Secondary | ICD-10-CM

## 2016-12-24 DIAGNOSIS — F329 Major depressive disorder, single episode, unspecified: Secondary | ICD-10-CM | POA: Diagnosis not present

## 2016-12-24 DIAGNOSIS — F419 Anxiety disorder, unspecified: Secondary | ICD-10-CM

## 2016-12-24 DIAGNOSIS — Z8669 Personal history of other diseases of the nervous system and sense organs: Secondary | ICD-10-CM | POA: Diagnosis not present

## 2016-12-24 DIAGNOSIS — Z299 Encounter for prophylactic measures, unspecified: Secondary | ICD-10-CM

## 2016-12-24 DIAGNOSIS — F32A Depression, unspecified: Secondary | ICD-10-CM

## 2016-12-24 MED ORDER — CHLORTHALIDONE 25 MG PO TABS: 25.0000 mg | ORAL_TABLET | Freq: Every day | ORAL | 3 refills | Status: DC

## 2016-12-24 MED ORDER — ASPIRIN 81 MG PO TABS: 81.0000 mg | ORAL_TABLET | Freq: Every day | ORAL | 0 refills | Status: DC

## 2016-12-24 MED ORDER — FLUOXETINE HCL 20 MG PO CAPS: 60.0000 mg | ORAL_CAPSULE | ORAL | 3 refills | Status: DC

## 2016-12-24 NOTE — Addendum Note (Signed)
Addended by: Areta Haber on: 12/24/2016 09:10 AM   Modules accepted: Orders

## 2016-12-24 NOTE — Progress Notes (Signed)
12/24/2016 8:58 AM   DOB: [birth year] / MRN: [id number]  SUBJECTIVE:  April Stevens is a 61 y.o. female presenting for history of HTN. Home measures typically run 130/80.  Takes ASA daily.  No CP, SOB, leg swelling. She has a 60 pack year history accumulated within about 20 years and quit about 10 years ago.  Not sleeping well. Taking prozac 40 for years after ending a bad relationships about 10 years.  Still has some anger outburst and days that she will cry, particularly if she misses a few doses.  Overall feels that the prozac is helping her greatly though and would consider going up on the dose.   Snores loudly, has been told that she has sleep apnea, and feels fatigued very often during the day. Has difficulty with losing weight despite eating a prudent diet.  No records exist in [facility] and she is unclear on how she was diagnosed. She is willing to see neurology.    She has No Known Allergies.   She  has a past medical history of Depression and Hypertension.    She  reports that she quit smoking about 18 years ago. She has a 60.00 pack-year smoking history. she has never used smokeless tobacco. She reports that she does not drink alcohol or use drugs. She  reports that she currently engages in sexual activity. The patient  has a past surgical history that includes Joint replacement and Shoulder surgery.  Her family history includes Hypertension in her father.  Review of Systems  Constitutional: Negative for chills, diaphoresis and fever.  Eyes: Negative.   Respiratory: Negative for cough, hemoptysis, sputum production, shortness of breath and wheezing.   Cardiovascular: Negative for chest pain, orthopnea and leg swelling.  Gastrointestinal: Negative for nausea.  Skin: Negative for rash.  Neurological: Negative for dizziness, sensory change, speech change, focal weakness and headaches.    The problem list and medications were reviewed and updated by myself where necessary and exist  elsewhere in the encounter.   OBJECTIVE:  BP 138/70 (BP Location: Right Arm, Patient Position: Sitting, Cuff Size: Normal)   Pulse 84   Temp 98.5 F (36.9 C) (Oral)   Resp 16   Ht 5\' 2"  (1.575 m)   Wt 185 lb (83.9 kg)   SpO2 96%   BMI 33.84 kg/m   Physical Exam  Constitutional: She is oriented to person, place, and time. She appears well-developed and well-nourished. She is active.  Non-toxic appearance.  Eyes: EOM are normal. Pupils are equal, round, and reactive to light.  Cardiovascular: Normal rate, regular rhythm, S1 normal, S2 normal, normal heart sounds and intact distal pulses. Exam reveals no gallop, no friction rub and no decreased pulses.  No murmur heard. Pulmonary/Chest: Effort normal. No stridor. No tachypnea. No respiratory distress. She has no wheezes. She has no rales.  Abdominal: She exhibits no distension.  Musculoskeletal: She exhibits no edema.  Neurological: She is alert and oriented to person, place, and time. She has normal strength and normal reflexes. She is not disoriented. She displays no atrophy. No cranial nerve deficit or sensory deficit. She exhibits normal muscle tone. Coordination and gait normal.  Skin: Skin is warm and dry. She is not diaphoretic. No pallor.  Psychiatric: Her behavior is normal.    BP Readings from Last 3 Encounters:  12/24/16 138/70  08/13/16 138/74  03/08/16 130/68   Lab Results  Component Value Date   CREATININE 0.77 03/08/2016   CREATININE 0.82 10/08/2014  CREATININE 0.89 03/14/2014   Lab Results  Component Value Date   K 3.2 (L) 03/08/2016   Lab Results  Component Value Date   CHOL 252 (H) 03/08/2016   HDL 47 03/08/2016   LDLCALC 142 (H) 03/08/2016   TRIG 316 (H) 03/08/2016   CHOLHDL 5.4 (H) 03/08/2016     The 10-year ASCVD risk score Mikey Bussing DC Jr., et al., 2013) is: 8%   Values used to calculate the score:     Age: 10 years     Sex: Female     Is Non-Hispanic African American: No     Diabetic: No      Tobacco smoker: No     Systolic Blood Pressure: 010 mmHg     Is BP treated: Yes     HDL Cholesterol: 47 mg/dL     Total Cholesterol: 252 mg/dL   No results found for this or any previous visit (from the past 72 hour(s)).  No results found.  ASSESSMENT AND PLAN:  April Stevens was seen today for medication refill.  Diagnoses and all orders for this visit:  History of sleep apnea: She has a history of this however remains untreated. No data exist in [facility]. She needs to be seen by sleep medicine. Referral is in and she is agreeable to this. She is mostly motivated by the potential for weight loss.  -     Ambulatory referral to Neurology  Hypertension, unspecified type: Will maintatin.  -     chlorthalidone (HYGROTON) 25 MG tablet; Take 1 tablet (25 mg total) by mouth daily.  Anxiety and depression: She was using xanax mostly for sleeping purposes.  Made aware to the risk of respiratory depression. She will follow up with sleep medicine.  -     FLUoxetine (PROZAC) 20 MG capsule; Take 3 capsules (60 mg total) by mouth every morning.  Need for prophylactic measure: 30 pack year history quit ten years ago.  She will continue ASA.  -     aspirin 81 MG tablet; Take 1 tablet (81 mg total) by mouth daily.    The patient is advised to call or return to clinic if she does not see an improvement in symptoms, or to seek the care of the closest emergency department if she worsens with the above plan.   Philis Fendt, MHS, PA-C [facility] 12/24/2016 8:58 AM

## 2016-12-24 NOTE — Patient Instructions (Addendum)
We recommend that you schedule a mammogram for breast cancer screening. Typically, you do not need a referral to do this. Please contact a local imaging center to schedule your mammogram.  [facility] - [phone]  *ask for the Radiology Department [facility]) - [phone] or [phone]  [facility] - [phone] [facility] - [phone] [facility] - [phone]  *ask for the Radiology Department  [facility] - [phone]  *ask for the Radiology Department [facility] - [phone]  *ask for the Mammography Department [facility] - [phone]     IF you received an x-ray today, you will receive an invoice from [facility]. Please contact [facility] at [phone] with questions or concerns regarding your invoice.   IF you received labwork today, you will receive an invoice from [facility]. Please contact [facility] at [phone] with questions or concerns regarding your invoice.   Our billing staff will not be able to assist you with questions regarding bills from these companies.  You will be contacted with the lab results as soon as they are available. The fastest way to get your results is to activate your My Chart account. Instructions are located on the last page of this paperwork. If you have not heard from us regarding the results in 2 weeks, please contact this office.      

## 2016-12-27 LAB — CBC
HEMATOCRIT: 39.1 % (ref 34.0–46.6)
Hemoglobin: 13.1 g/dL (ref 11.1–15.9)
MCH: 30.5 pg (ref 26.6–33.0)
MCHC: 33.5 g/dL (ref 31.5–35.7)
MCV: 91 fL (ref 79–97)
PLATELETS: 368 10*3/uL (ref 150–379)
RBC: 4.3 x10E6/uL (ref 3.77–5.28)
RDW: 14.8 % (ref 12.3–15.4)
WBC: 9.6 10*3/uL (ref 3.4–10.8)

## 2016-12-27 LAB — CMP AND LIVER
ALT: 12 IU/L (ref 0–32)
AST: 19 IU/L (ref 0–40)
Albumin: 4.3 g/dL (ref 3.6–4.8)
Alkaline Phosphatase: 92 IU/L (ref 39–117)
BILIRUBIN, DIRECT: 0.15 mg/dL (ref 0.00–0.40)
BUN: 14 mg/dL (ref 8–27)
Bilirubin Total: 0.5 mg/dL (ref 0.0–1.2)
CALCIUM: 9.3 mg/dL (ref 8.7–10.3)
CO2: 26 mmol/L (ref 20–29)
CREATININE: 0.93 mg/dL (ref 0.57–1.00)
Chloride: 101 mmol/L (ref 96–106)
GFR, EST AFRICAN AMERICAN: 76 mL/min/{1.73_m2} (ref >59)
GFR, EST NON AFRICAN AMERICAN: 66 mL/min/{1.73_m2} (ref >59)
GLUCOSE: 136 mg/dL — AB (ref 65–99)
Potassium: 2.9 mmol/L — ABNORMAL LOW (ref 3.5–5.2)
SODIUM: 144 mmol/L (ref 134–144)
TOTAL PROTEIN: 7 g/dL (ref 6.0–8.5)

## 2016-12-27 LAB — TSH: TSH: 2.29 u[IU]/mL (ref 0.450–4.500)

## 2016-12-27 LAB — HEMOGLOBIN A1C
Est. average glucose Bld gHb Est-mCnc: 123 mg/dL
Hgb A1c MFr Bld: 5.9 % — ABNORMAL HIGH (ref 4.8–5.6)

## 2017-01-03 ENCOUNTER — Other Ambulatory Visit: Payer: Self-pay | Admitting: Physician Assistant

## 2017-01-03 MED ORDER — POTASSIUM CHLORIDE CRYS ER 20 MEQ PO TBCR: 10.0000 meq | EXTENDED_RELEASE_TABLET | Freq: Every day | ORAL | 3 refills | Status: DC

## 2017-01-25 ENCOUNTER — Telehealth: Payer: Self-pay | Admitting: Physician Assistant

## 2017-01-25 NOTE — Telephone Encounter (Signed)
Copied from [place] [phone]. Topic: Quick Communication - See Telephone Encounter >> Jan 25, 2017 12:12 PM Ether Griffins B wrote: CRM for notification. See Telephone encounter for:  Pt calling about results she was suppose to receive them in the mail and hasnt received a copy and doesn't know the results  01/25/17.

## 2017-01-27 NOTE — Telephone Encounter (Signed)
Spoke with pt.  Did not realize a rx for potassium had been sent. Advised to call pharmacy and sent lab results in mail to pt today

## 2017-02-22 ENCOUNTER — Encounter: Payer: Self-pay | Admitting: Neurology

## 2017-02-22 ENCOUNTER — Ambulatory Visit: Payer: Managed Care, Other (non HMO) | Admitting: Neurology

## 2017-02-22 VITALS — BP 157/93 | HR 92 | Ht 62.0 in | Wt 177.0 lb

## 2017-02-22 DIAGNOSIS — R0683 Snoring: Secondary | ICD-10-CM | POA: Insufficient documentation

## 2017-02-22 DIAGNOSIS — G4726 Circadian rhythm sleep disorder, shift work type: Secondary | ICD-10-CM

## 2017-02-22 DIAGNOSIS — Z72821 Inadequate sleep hygiene: Secondary | ICD-10-CM

## 2017-02-22 DIAGNOSIS — Z72820 Sleep deprivation: Secondary | ICD-10-CM | POA: Insufficient documentation

## 2017-02-22 DIAGNOSIS — G4719 Other hypersomnia: Secondary | ICD-10-CM | POA: Insufficient documentation

## 2017-02-22 DIAGNOSIS — G4733 Obstructive sleep apnea (adult) (pediatric): Secondary | ICD-10-CM | POA: Insufficient documentation

## 2017-02-22 NOTE — Patient Instructions (Signed)
Please remember to try to maintain good sleep hygiene, which means: Keep a regular sleep and wake schedule, try not to exercise or have a meal within 2 hours of your bedtime, eliminate afternoon caffeine intake,  try to keep your bedroom conducive for sleep, that is, cool and dark, without light distractors such as an illuminated alarm clock, and refrain from watching TV right before sleep or in the middle of the night and do not keep the TV or radio on during the night.  Also, try not to use or play on electronic devices at bedtime, such as your cell phone, tablet PC or laptop. If you like to read at bedtime on an electronic device, try to dim the background light as much as possible. Do not eat in the middle of the night.   We will request a Home sleep study based on [employer] criteria. . We will look for snoring or sleep apnea.   For chronic insomnia, you are best followed by a psychiatrist and/or sleep psychologist.  We will call you with the sleep study results and make a follow up appointment if needed.   Hypersomnia Hypersomnia is when you feel extremely tired during the day even though you're getting plenty of sleep at night. You may need to take naps during the day, and you may also be extremely difficult to wake up when you are sleeping. What are the causes? The cause of your hypersomnia may not be known. Hypersomnia may be caused by:  Medicines.  Sleep disorders, such as narcolepsy.  Trauma or injury to your head or nervous system.  Using drugs or alcohol.  Tumors.  Medical conditions, such as depression or hypothyroidism.  Genetics.  What are the signs or symptoms? The main symptoms of hypersomnia include:  Feeling extremely tired throughout the day.  Being very difficult to wake up.  Sleeping for longer and longer periods.  Taking naps throughout the day.  Other symptoms may include:  Feeling: ? Restless. ? Annoyed. ? Anxious. ? Low energy.  Having  difficulty: ? Remembering. ? Speaking. ? Thinking.  Losing your appetite.  Experiencing hallucinations.  How is this diagnosed? Hypersomnia may be diagnosed by:  Medical history and physical exam. This will include a sleep history.  Completing sleep logs.  Tests may also be done, such as: ? Polysomnography. ? Multiple sleep latency test (MSLT).  How is this treated? There is no cure for hypersomnia, but treatment can be very effective in helping manage the condition. Treatment may include:  Lifestyle and sleeping strategies to help cope with the condition.  Stimulant medicines.  Treating any underlying causes of hypersomnia.  Follow these instructions at home:  Take medicines only as directed by your health care provider.  Schedule short naps for when you feel sleepiest during the day. Tell your employer or teachers that you have hypersomnia. You may be able to adjust your schedule to include time for naps.  Avoid drinking alcohol or caffeinated beverages.  Do not eat a heavy meal before bedtime. Eat at about the same times every day.  Do not drive or operate heavy machinery if you are sleepy.  Do not swim or go out on the water without a life jacket.  If possible, adjust your schedule so that you do not have to work or be active at night.  Keep all follow-up visits as directed by your health care provider. This is important. Contact a health care provider if:  You have new symptoms.  Your  symptoms get worse. Get help right away if: You have serious thoughts of hurting yourself or someone else. This information is not intended to replace advice given to you by your health care provider. Make sure you discuss any questions you have with your health care provider. Document Released: 12/25/2001 Document Revised: 06/12/2015 Document Reviewed: 08/09/2013 Elsevier Interactive Patient Education  [employer].
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
## 2017-03-14 ENCOUNTER — Telehealth: Payer: Self-pay | Admitting: Neurology

## 2017-03-14 NOTE — Telephone Encounter (Signed)
We have attempted to call the patient two times to schedule sleep study. Patient has been unavailable at the phone numbers we have on file, and has not returned our calls. At this time, we will send a letter asking patient to please contact the sleep lab.  °

## 2017-03-28 ENCOUNTER — Ambulatory Visit: Payer: Managed Care, Other (non HMO) | Admitting: Physician Assistant

## 2017-03-30 ENCOUNTER — Encounter: Payer: Self-pay | Admitting: Physician Assistant

## 2017-03-30 ENCOUNTER — Ambulatory Visit: Payer: Managed Care, Other (non HMO) | Admitting: Physician Assistant

## 2017-03-30 VITALS — BP 145/74 | HR 102 | Temp 99.5°F | Resp 17 | Ht 61.5 in | Wt 177.0 lb

## 2017-03-30 DIAGNOSIS — J069 Acute upper respiratory infection, unspecified: Secondary | ICD-10-CM

## 2017-03-30 DIAGNOSIS — B9789 Other viral agents as the cause of diseases classified elsewhere: Secondary | ICD-10-CM | POA: Diagnosis not present

## 2017-03-30 MED ORDER — BENZONATATE 100 MG PO CAPS: 100.0000 mg | ORAL_CAPSULE | Freq: Two times a day (BID) | ORAL | 0 refills | Status: DC | PRN

## 2017-03-30 NOTE — Progress Notes (Signed)
    03/30/2017 3:10 PM   DOB: 05-02-1955 / MRN: [id number]  SUBJECTIVE:  April Stevens is a 62 y.o. female presenting for cough, nasal congestion, sore throat.  Denies fever.  He is eating well. Taking OTC analgesics with some relief. Denies chest pain, SOB, new DOE, orthopnea, leg swelling.    Immunization History  Administered Date(s) Administered  . Tdap 02/23/2012     She has No Known Allergies.   She  has a past medical history of Depression and Hypertension.    She  reports that she quit smoking about 19 years ago. She has a 60.00 pack-year smoking history. she has never used smokeless tobacco. She reports that she does not drink alcohol or use drugs. She  reports that she currently engages in sexual activity. The patient  has a past surgical history that includes Joint replacement and Shoulder surgery.  Her family history includes Heart attack in her father; Hypertension in her father.  ROS  Per HPI  The problem list and medications were reviewed and updated by myself where necessary and exist elsewhere in the encounter.   OBJECTIVE:  BP (!) 145/74   Pulse (!) 102   Temp 99.5 F (37.5 C) (Oral)   Resp 17   Ht 5' 1.5" (1.562 m)   Wt 177 lb (80.3 kg)   SpO2 98%   BMI 32.90 kg/m   Physical Exam  Constitutional: She is active.  Non-toxic appearance.  Cardiovascular: Normal rate, regular rhythm, S1 normal, S2 normal, normal heart sounds and intact distal pulses. Exam reveals no gallop, no friction rub and no decreased pulses.  No murmur heard. Pulmonary/Chest: Effort normal. No stridor. No tachypnea. No respiratory distress. She has no wheezes. She has no rales.  Abdominal: She exhibits no distension.  Musculoskeletal: She exhibits no edema.  Neurological: She is alert.  Skin: Skin is warm and dry. She is not diaphoretic. No pallor.    No results found for this or any previous visit (from the past 72 hour(s)).  No results found.  ASSESSMENT AND PLAN:  April Stevens  was seen today for uri and fatigue.  Diagnoses and all orders for this visit:  Viral URI with cough    The patient is advised to call or return to clinic if she does not see an improvement in symptoms, or to seek the care of the closest emergency department if she worsens with the above plan.   Philis Fendt, MHS, PA-C Primary Care at [facility] 03/30/2017 3:10 PM

## 2017-03-30 NOTE — Patient Instructions (Addendum)
  Add mucinex to your regiemn.    IF you received an x-ray today, you will receive an invoice from [facility]. Please contact [facility] at [phone] with questions or concerns regarding your invoice.   IF you received labwork today, you will receive an invoice from [facility]. Please contact [facility] at [phone] with questions or concerns regarding your invoice.   Our billing staff will not be able to assist you with questions regarding bills from these companies.  You will be contacted with the lab results as soon as they are available. The fastest way to get your results is to activate your My Chart account. Instructions are located on the last page of this paperwork. If you have not heard from [place] regarding the results in 2 weeks, please contact this office.

## 2017-04-18 ENCOUNTER — Ambulatory Visit (INDEPENDENT_AMBULATORY_CARE_PROVIDER_SITE_OTHER): Payer: Managed Care, Other (non HMO) | Admitting: Neurology

## 2017-04-18 DIAGNOSIS — G4719 Other hypersomnia: Secondary | ICD-10-CM

## 2017-04-18 DIAGNOSIS — G4733 Obstructive sleep apnea (adult) (pediatric): Secondary | ICD-10-CM | POA: Diagnosis not present

## 2017-04-18 DIAGNOSIS — R0683 Snoring: Secondary | ICD-10-CM

## 2017-04-18 DIAGNOSIS — Z72820 Sleep deprivation: Secondary | ICD-10-CM

## 2017-04-18 DIAGNOSIS — Z72821 Inadequate sleep hygiene: Secondary | ICD-10-CM

## 2017-04-18 DIAGNOSIS — G4726 Circadian rhythm sleep disorder, shift work type: Secondary | ICD-10-CM

## 2017-04-18 DIAGNOSIS — R0902 Hypoxemia: Secondary | ICD-10-CM

## 2017-04-21 NOTE — Procedures (Signed)
[facility] [address] [place], [place] [address] NAME:   [name]                                                           DOB: [birth year] MEDICAL RECORD [id number]                                          DOS: 04/18/2017 REFERRING PHYSICIAN: Philis Fendt, PA-C STUDY PERFORMED: Home Sleep Study by [facility]  HISTORY: April Stevens is a 62 y.o. female, seen here as referral patient from [place]. [place] for a sleep evaluation. The patient reports that she had a sleep study about 15 years ago and obstructive sleep apnea was diagnosed, and she used CPAP for several years.  She has not used CPAP for at least 6 or 7 years by now.  She is not sure if she still has obstructive sleep apnea, but feels tired and excessively daytime sleepy. She has been told that she snores very, very loud and that she stops breathing - witnessed by her female partner.  The patient carries a diagnosis of hypertension, hypercholesterolemia. His referral also noted that Xanax had been discontinued, as had Flexeril, hydrocodone, Claritin, methylprednisolone injections, Naprosyn, and Percocet has been discontinued. BMI: 32.3, Epworth 18/ 24 points.   STUDY RESULTS:  Total Recording Time: 10 hours; 55 minutes. Total Apnea/Hypopnea Index (AHI):  50.4 /h, RDI was 53.5 /h. Average Oxygen Saturation: 91%, Lowest Oxygen Desaturation: 75%.  Total Time in Oxygen Saturation below 89 % was 112 minutes.  Average Heart Rate:  68 bpm (between 43 and 91bpm). IMPRESSION: Severe Sleep Apnea, the majority (76%) obstructive in origin.  Severe hypoxemia and prolonged desaturation time.  Bradycardia.  RECOMMENDATION: This patient's apnea cannot be treated with a dental device, she needs CPAP intervention and possibly oxygen titration as well. I will order an attended sleep study titration.  I certify that I have reviewed the raw data recording prior to the issuance of this report in accordance with the standards  of the American Academy of Sleep Medicine (AASM).  Larey Seat, M.D.  04-21-2017  [facility] [place]. Diplomat, ABPN and ABSM. Member of and Accredited by [employer]

## 2017-04-21 NOTE — Addendum Note (Signed)
Addended by: Larey Seat on: 04/21/2017 07:10 PM   Modules accepted: Orders

## 2017-04-23 ENCOUNTER — Ambulatory Visit: Payer: Managed Care, Other (non HMO) | Admitting: Urgent Care

## 2017-04-23 ENCOUNTER — Ambulatory Visit (INDEPENDENT_AMBULATORY_CARE_PROVIDER_SITE_OTHER): Payer: Managed Care, Other (non HMO)

## 2017-04-23 ENCOUNTER — Encounter: Payer: Self-pay | Admitting: Urgent Care

## 2017-04-23 ENCOUNTER — Other Ambulatory Visit: Payer: Self-pay

## 2017-04-23 VITALS — BP 134/77 | HR 77 | Temp 98.2°F | Resp 16 | Ht 61.5 in | Wt 177.4 lb

## 2017-04-23 DIAGNOSIS — S60041A Contusion of right ring finger without damage to nail, initial encounter: Secondary | ICD-10-CM | POA: Diagnosis not present

## 2017-04-23 DIAGNOSIS — S6991XA Unspecified injury of right wrist, hand and finger(s), initial encounter: Secondary | ICD-10-CM | POA: Diagnosis not present

## 2017-04-23 DIAGNOSIS — M79641 Pain in right hand: Secondary | ICD-10-CM | POA: Diagnosis not present

## 2017-04-23 NOTE — Patient Instructions (Addendum)
You may take 500mg  Tylenol with ibuprofen 400-600mg  every 6 hours for pain and inflammation. Use buddy tape system for the next 1-2 weeks.      IF you received an x-ray today, you will receive an invoice from [facility]. Please contact [facility] at [phone] with questions or concerns regarding your invoice.   IF you received labwork today, you will receive an invoice from [facility]. Please contact [facility] at [phone] with questions or concerns regarding your invoice.   Our billing staff will not be able to assist you with questions regarding bills from these companies.  You will be contacted with the lab results as soon as they are available. The fastest way to get your results is to activate your My Chart account. Instructions are located on the last page of this paperwork. If you have not heard from [place] regarding the results in 2 weeks, please contact this office.

## 2017-04-23 NOTE — Progress Notes (Signed)
   MRN: [id number] DOB: [birth year]  Subjective:   April Stevens is a 62 y.o. female presenting for 1 day history of right hand injury. Patient stumbled over hand truck and jammed her hand and finger into a metal door. Has had pain, swelling, bruising of her 4th right ring finger. Has tried Alleve last night with some relief.   [name] has a current medication list which includes the following prescription(s): aspirin, chlorthalidone, fluoxetine, potassium chloride sa, and benzonatate. Also has No Known Allergies.  [name]  has a past medical history of Depression and Hypertension. Also  has a past surgical history that includes Joint replacement and Shoulder surgery.  Objective:   Vitals: BP 134/77   Pulse 77   Temp 98.2 F (36.8 C)   Resp 16   Ht 5' 1.5" (1.562 m)   Wt 177 lb 6.4 oz (80.5 kg)   SpO2 98%   BMI 32.98 kg/m   Physical Exam  Constitutional: She is oriented to person, place, and time. She appears well-developed and well-nourished.  Cardiovascular: Normal rate.  Pulmonary/Chest: Effort normal.  Musculoskeletal:       Right hand: She exhibits decreased range of motion (flexion and extension of 4th right finger), tenderness (exquisite tenderness over 4th metacarpal up to PIP joint) and swelling (4th finger up to PIP). She exhibits normal capillary refill, no deformity and no laceration. Normal sensation noted. Normal strength noted.  Neurological: She is alert and oriented to person, place, and time.   Dg Hand Complete Right  Result Date: 04/23/2017 CLINICAL DATA:  Fourth digit injury with pain, initial encounter EXAM: RIGHT HAND - COMPLETE 3+ VIEW COMPARISON:  None. FINDINGS: Mild soft tissue swelling is noted. No acute fracture or dislocation is seen. Mild degenerative changes in the interphalangeal joints are seen. IMPRESSION: Mild soft tissue swelling.  No acute bony abnormality is noted. Electronically Signed   By: Inez Catalina M.D.   On: 04/23/2017 09:20    Assessment and  Plan :   Contusion of right ring finger without damage to nail, initial encounter  Right hand pain  Finger injury, right, initial encounter - Plan: DG Hand Complete Right  Will manage conservatively for now. Wrote work note for [employer] duty and work restrictions. Return-to-clinic precautions discussed, patient verbalized understanding.   Jaynee Eagles, PA-C Primary Care at [facility] [phone] 04/23/2017  9:04 AM

## 2017-04-25 ENCOUNTER — Telehealth: Payer: Self-pay

## 2017-04-25 NOTE — Telephone Encounter (Signed)
I called pt to discuss her sleep study results. No answer, VM is full, will try again later.

## 2017-04-25 NOTE — Telephone Encounter (Signed)
-----   Message from Larey Seat, MD sent at 04/21/2017  7:10 PM EDT ----- Total Recording Time: 10 hours; 55 minutes. Total Apnea/Hypopnea Index (AHI): 50.4 /h, RDI was 53.5 /h. Average Oxygen Saturation: 91%, Lowest Oxygen Desaturation: 75%.  Total Time in Oxygen Saturation below 89 % was 112 minutes.  Average Heart Rate: 68 bpm (between 43 and 91bpm). IMPRESSION: Severe Sleep Apnea, the majority (76%) obstructive in  origin.  Severe hypoxemia and prolonged desaturation time. Bradycardia.  RECOMMENDATION: This patient's apnea cannot be treated with a  dental device, she needs CPAP intervention and possibly oxygen  titration as well.  I will order an attended sleep study  titration.

## 2017-04-27 NOTE — Telephone Encounter (Signed)
Pt returned Rn's call. Call ;transferred to RN

## 2017-04-27 NOTE — Telephone Encounter (Signed)
I called pt. I advised pt that Dr. Brett Fairy reviewed their sleep study results and found that pt has severe osa with prolonged hypoxemia with bradycardia noted and recommends that pt be treated with a cpap. Dr. Brett Fairy recommends that pt return for a repeat sleep study in order to properly titrate the cpap and ensure a good mask fit. Pt is agreeable to returning for a titration study. I advised pt that our sleep lab will file with pt's insurance and call pt to schedule the sleep study when we hear back from the pt's insurance regarding coverage of this sleep study. Pt verbalized understanding of results. Pt had no questions at this time but was encouraged to call back if questions arise.

## 2017-05-09 ENCOUNTER — Telehealth: Payer: Self-pay

## 2017-05-09 NOTE — Telephone Encounter (Signed)
[employer] denied cpap titration. Pain medicine did not qualify her. She will need an auto unit ordered.

## 2017-05-10 ENCOUNTER — Other Ambulatory Visit: Payer: Self-pay | Admitting: Neurology

## 2017-05-10 DIAGNOSIS — G4733 Obstructive sleep apnea (adult) (pediatric): Secondary | ICD-10-CM

## 2017-05-10 NOTE — Telephone Encounter (Signed)
I have called the pt to inform her of this information. No answer. LVM for the pt to call us back so that we can go over this information with her. I have also printed all of her information to send to [facility] after discussing this with the patient.   When pt calls back please have Shirlean Mylar or Cyril Mourning discuss that insurance denied her to come in for a cpap titration test.  We would also need to establish an follow up apt within 60-90 days of starting the machine.

## 2017-05-12 NOTE — Telephone Encounter (Signed)
I called pt again to discuss. No answer, left a message asking her to call me back. 

## 2017-05-13 NOTE — Telephone Encounter (Signed)
Pt has returned the call to [place] from yesterday.  Pt aware she is not in office, she is asking for a returned call on her next day in the office

## 2017-05-16 NOTE — Telephone Encounter (Signed)
I have called the patient back and informed her that insurance did not allow for the pt to have inlab study.I have went over the auto CPAP option that they are recommending we try first. I reviewed PAP compliance expectations with the pt. Pt is agreeable to starting a CPAP. I advised pt that an order will be sent to a DME, [facility], and [facility] will call the pt within about one week after they file with the pt's insurance. [facility] will show the pt how to use the machine, fit for masks, and troubleshoot the CPAP if needed. A follow up appt was made for insurance purposes with Cecille Rubin, NP on July 26, 2017 at 10:45 am. Pt verbalized understanding to arrive 15 minutes early and bring their CPAP. A letter with all of this information in it will be mailed to the pt as a reminder. I verified with the pt that the address we have on file is correct. Pt verbalized understanding of results. Pt had no questions at this time but was encouraged to call back if questions arise.

## 2017-07-25 NOTE — Progress Notes (Deleted)
[facility]  PATIENT: April Stevens DOB: 1955-08-15   REASON FOR VISIT: Follow-up for newly diagnosed obstructive sleep apnea here for initial CPAP compliance  HISTORY FROM:    HISTORY OF PRESENT ILLNESS: 2/5/19CDDebra L Mruk is a 62 y.o. female , seen here as referral patient from [place]. [place] for a sleep evaluation.   Chief complaint according to patient : I have the pleasure of seeing April Stevens today, a 62 year old Caucasian right-handed female patient referred by [facility].  The patient reports that she had a sleep study about 15 years ago obstructive sleep apnea and used CPAP for several years.  She has not uses CPAP for at least 6 or 7 years.  She is not sure if she still has obstructive sleep apnea, but  feels tired and excessively daytime sleepy. She has been told that she snores very , very loud and that she stops breathing - witnessed by her female partner.   The patient carries a diagnosis of hypertension currently only elevated blood pressures, hypercholesterolemia with a total cholesterol of 200 hypokalemia as of March 08, 2016 transient.  The creatinine level is 0.77 per labs, BP from December 2018 was 138/70 mmHg.  Triglycerides are very high at 316, on chlorthalidone, fluoxetine, baby aspirin which are all 5 filled through physician assistant Philis Fendt.  His referral also noted that Xanax had been discontinued , as had Flexeril , hydrocodone , Claritin , methylprednisolone injections have been discontinued, Naprosyn was discontinued, and  Percocet has been discontinued.  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  She usually feels still wound up and wants to clean the house or do things around the house before she will settle for bedtime.  She will only retreat to her bedroom with the TV on in the background, reports  that she cannot sleep without the TV.  The TV is set at a timer -she is never awake when it cuts off, an is unsure as to what the settings are. Bedroom is " OK" ,at 71 degrees , she has still hot flushes, she has light clocking curtains and the TV on... She falls asleep usually on her side, wakes up on her back.  She just intvested in a new mattress, the bed is not adjustable. She is sleeping on 2 pillows , one for head support. She sleeps alone with her dog. The dog wakes her at about 7 AM to be let out of the home.  She feels a little groggy craves more sleep when she wakes up at that time.She estimates her total nocturnal sleep time to be 3 or 4 hours only. In daytime she has no trouble initiating sleep, if she has the opportunity to take a nap she will fall asleep very promptly stay asleep for 3-4 hours, not feel very refreshed after daytime naps. She leaves the house at 1.45 PM and works 40 hours weekly, full time.     REVIEW OF SYSTEMS: Full 14 system review of systems performed and notable only for those listed, all others are neg:  Constitutional: neg  Cardiovascular: neg Ear/Nose/Throat: neg  Skin: neg Eyes: neg Respiratory: neg Gastroitestinal: neg  Hematology/Lymphatic: neg  Endocrine: neg Musculoskeletal:neg Allergy/Immunology: neg Neurological: neg Psychiatric: neg Sleep : neg   ALLERGIES: No Known Allergies  HOME MEDICATIONS: Outpatient Medications Prior to Visit  Medication Sig Dispense Refill  . aspirin 81 MG tablet Take 1 tablet (81 mg total) by mouth daily. 365 tablet 0  . benzonatate (TESSALON) 100 MG capsule Take 1 capsule (100 mg total) by mouth 2 (two) times daily as needed for cough. (Patient not taking: Reported on 04/23/2017) 20 capsule 0  . chlorthalidone (HYGROTON) 25 MG tablet Take 1 tablet (25 mg total) by mouth daily. 90 tablet 3  . FLUoxetine (PROZAC) 20 MG capsule Take 3 capsules (60 mg total) by mouth every morning. 270 capsule 3  . potassium chloride  SA (K-DUR,KLOR-CON) 20 MEQ tablet Take 0.5 tablets (10 mEq total) by mouth daily. 30 tablet 3   No facility-administered medications prior to visit.     PAST MEDICAL HISTORY: Past Medical History:  Diagnosis Date  . Depression   . Hypertension     PAST SURGICAL HISTORY: Past Surgical History:  Procedure Laterality Date  . JOINT REPLACEMENT    . SHOULDER SURGERY      FAMILY HISTORY: Family History  Problem Relation Age of Onset  . Hypertension Father   . Heart attack Father     SOCIAL HISTORY: Social History   Socioeconomic History  . Marital status: Single    Spouse name: Not on file  . Number of children: Not on file  . Years of education: Not on file  . Highest education level: Not on file  Occupational History  . Not on file  Social Needs  . Financial resource strain: Not on file  . Food insecurity:    Worry: Not on file    Inability: Not on file  . Transportation needs:    Medical: Not on file    Non-medical: Not on file  Tobacco Use  . Smoking status: Former Smoker    Packs/day: 3.00    Years: 20.00    Pack years: 60.00    Last attempt to quit: 2000    Years since quitting: 19.5  . Smokeless tobacco: Never Used  Substance and Sexual Activity  . Alcohol use: No    Alcohol/week: 0.0 oz  . Drug use: No  . Sexual activity: Yes  Lifestyle  . Physical activity:    Days per week: Not on file    Minutes per session: Not on file  . Stress: Not on file  Relationships  . Social connections:    Talks on phone: Not on file    Gets together: Not on file    Attends religious service: Not on file    Active member of club or organization: Not on file    Attends meetings of clubs or organizations: Not on file    Relationship status: Not on file  . Intimate partner violence:    Fear of current or ex partner: Not on file    Emotionally abused: Not on file    Physically abused: Not on file    Forced sexual activity: Not on file  Other Topics Concern  . Not  on file  Social History Narrative  . Not on file     PHYSICAL EXAM  There were no vitals filed for this visit. There is no height or weight on file to calculate BMI.  Generalized: Well developed, in no acute distress  Head: normocephalic and atraumatic,. Oropharynx benign  Neck: Supple, no carotid bruits  Cardiac: Regular rate rhythm, no murmur  Musculoskeletal: No deformity   Neurological examination   Mentation: Alert oriented to time, place, history taking. Attention span and concentration appropriate.  Recent and remote memory intact.  Follows all commands speech and language fluent.   Cranial nerve II-XII: Fundoscopic exam reveals sharp disc margins.Pupils were equal round reactive to light extraocular movements were full, visual field were full on confrontational test. Facial sensation and strength were normal. hearing was intact to finger rubbing bilaterally. Uvula tongue midline. head turning and shoulder shrug were normal and symmetric.Tongue protrusion into cheek strength was normal. Motor: normal bulk and tone, full strength in the BUE, BLE, fine finger movements normal, no pronator drift. No focal weakness Sensory: normal and symmetric to light touch, pinprick, and  Vibration, proprioception  Coordination: finger-nose-finger, heel-to-shin bilaterally, no dysmetria Reflexes: Brachioradialis 2/2, biceps 2/2, triceps 2/2, patellar 2/2, Achilles 2/2, plantar responses were flexor bilaterally. Gait and Station: Rising up from seated position without assistance, normal stance,  moderate stride, good arm swing, smooth turning, able to perform tiptoe, and heel walking without difficulty. Tandem gait is steady  DIAGNOSTIC DATA (LABS, IMAGING, TESTING) - I reviewed patient records, labs, notes, testing and imaging myself where available.  Lab Results  Component Value Date   WBC 9.6 12/24/2016   HGB 13.1 12/24/2016   HCT 39.1 12/24/2016   MCV 91 12/24/2016   PLT 368 12/24/2016       Component Value Date/Time   NA 144 12/24/2016 0926   K 2.9 (L) 12/24/2016 0926   CL 101 12/24/2016 0926   CO2 26 12/24/2016 0926   GLUCOSE 136 (H) 12/24/2016 0926   GLUCOSE 125 (H) 10/08/2014 1806   BUN 14 12/24/2016 0926   CREATININE 0.93 12/24/2016 0926   CREATININE 0.89 03/14/2014 1146   CALCIUM 9.3 12/24/2016 0926   PROT 7.0 12/24/2016 0926   ALBUMIN 4.3 12/24/2016 0926   AST 19 12/24/2016 0926   ALT 12 12/24/2016 0926   ALKPHOS 92 12/24/2016 0926   BILITOT 0.5 12/24/2016 0926   GFRNONAA 66 12/24/2016 0926   GFRNONAA 71 03/14/2014 1146   GFRAA 76 12/24/2016 0926   GFRAA 81 03/14/2014 1146   Lab Results  Component Value Date   CHOL 252 (H) 03/08/2016   HDL 47 03/08/2016   LDLCALC 142 (H) 03/08/2016   TRIG 316 (H) 03/08/2016   CHOLHDL 5.4 (H) 03/08/2016   Lab Results  Component Value Date   HGBA1C 5.9 (H) 12/24/2016   No results found for: [id number] Lab Results  Component Value Date   TSH 2.290 12/24/2016    ***  ASSESSMENT AND PLAN  62 y.o. year old female  has a past medical history of Depression and Hypertension. here with ***    Rayburn Ma, [facility], APRN  [facility] [address], [place], [place] [address] [phone]

## 2017-07-26 ENCOUNTER — Ambulatory Visit: Payer: Self-pay | Admitting: Nurse Practitioner

## 2017-07-26 ENCOUNTER — Telehealth: Payer: Self-pay | Admitting: *Deleted

## 2017-07-26 NOTE — Telephone Encounter (Signed)
Patient called answering service this morning and cancelled her follow up for today.

## 2017-07-27 ENCOUNTER — Encounter: Payer: Self-pay | Admitting: Nurse Practitioner

## 2017-07-27 NOTE — Progress Notes (Signed)
[facility]  PATIENT: April Stevens DOB: 1955/09/03   REASON FOR VISIT: Follow-up for newly diagnosed obstructive sleep apnea here for initial CPAP compliance  HISTORY FROM: patient    HISTORY OF PRESENT ILLNESS:UPDATE 7/15/2019CM [name], 62 year old female returns for follow-up with newly diagnosed obstructive sleep apnea here for initial CPAP compliance.  Patient states she is doing well with the machine no issues however she forgets to put it on at night when she goes to bed.  Compliance data dated 04/29/2017-07/27/2017 which is 3 months shows compliance greater than 4 hours for 9 days at 10%.  Average usage 5 hours 35 minutes.  Set pressure 5 to 15 cm EPR level 3 AHI 5.1 ESS 18.  She returns for reevaluation    2/5/19CDDebra L Stevens is a 62 y.o. female , seen here as referral patient from [place]. [facility] for a sleep evaluation.   Chief complaint according to patient : I have the pleasure of seeing Mrs. April Stevens today, a 62 year old Caucasian right-handed female patient referred by [facility].  The patient reports that she had a sleep study about 15 years ago obstructive sleep apnea and used CPAP for several years.  She has not uses CPAP for at least 6 or 7 years.  She is not sure if she still has obstructive sleep apnea, but  feels tired and excessively daytime sleepy. She has been told that she snores very , very loud and that she stops breathing - witnessed by her female partner.   The patient carries a diagnosis of hypertension currently only elevated blood pressures, hypercholesterolemia with a total cholesterol of 200 hypokalemia as of March 08, 2016 transient.  The creatinine level is 0.77 per labs, BP from December 2018 was 138/70 mmHg.  Triglycerides are very high at 316, on chlorthalidone, fluoxetine, baby aspirin which are all 5 filled through physician assistant Philis Fendt.  His referral also noted that Xanax had been discontinued ,  as had Flexeril , hydrocodone , Claritin , methylprednisolone injections have been discontinued, Naprosyn was discontinued, and  Percocet has been discontinued.  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  She usually feels still wound up and wants to clean the house or do things around the house before she will settle for bedtime.  She will only retreat to her bedroom with the TV on in the background, reports that she cannot sleep without the TV.  The TV is set at a timer -she is never awake when it cuts off, an is unsure as to what the settings are. Bedroom is " OK" ,at 71 degrees , she has still hot flushes, she has light clocking curtains and the TV on... She falls asleep usually on her side, wakes up on her back.  She just intvested in a new mattress, the bed is not adjustable. She is sleeping on 2 pillows , one for head support. She sleeps alone with her dog. The dog wakes her at about 7 AM to be let out of the home.  She feels a little groggy craves more sleep when she wakes up at that time.She estimates her total nocturnal sleep time to be 3 or 4 hours only. In daytime she has no trouble initiating sleep, if she has the opportunity to take a nap she will fall asleep very promptly stay asleep for  3-4 hours, not feel very refreshed after daytime naps. She leaves the house at 1.45 PM and works 40 hours weekly, full time.     REVIEW OF SYSTEMS: Full 14 system review of systems performed and notable only for those listed, all others are neg:  Constitutional: neg  Cardiovascular: neg Ear/Nose/Throat: neg  Skin: neg Eyes: neg Respiratory: neg Gastroitestinal: neg  Hematology/Lymphatic: neg  Endocrine: neg Musculoskeletal:neg Allergy/Immunology: neg Neurological: neg Psychiatric: neg Sleep : Obstructive sleep apnea with CPAP  ALLERGIES: No Known Allergies  HOME MEDICATIONS: Outpatient  Medications Prior to Visit  Medication Sig Dispense Refill  . aspirin 81 MG tablet Take 1 tablet (81 mg total) by mouth daily. 365 tablet 0  . chlorthalidone (HYGROTON) 25 MG tablet Take 1 tablet (25 mg total) by mouth daily. 90 tablet 3  . FLUoxetine (PROZAC) 20 MG capsule Take 3 capsules (60 mg total) by mouth every morning. 270 capsule 3  . potassium chloride SA (K-DUR,KLOR-CON) 20 MEQ tablet Take 0.5 tablets (10 mEq total) by mouth daily. 30 tablet 3  . benzonatate (TESSALON) 100 MG capsule Take 1 capsule (100 mg total) by mouth 2 (two) times daily as needed for cough. (Patient not taking: Reported on 04/23/2017) 20 capsule 0   No facility-administered medications prior to visit.     PAST MEDICAL HISTORY: Past Medical History:  Diagnosis Date  . Depression   . Hypertension   . [name]     PAST SURGICAL HISTORY: Past Surgical History:  Procedure Laterality Date  . JOINT REPLACEMENT    . SHOULDER SURGERY      FAMILY HISTORY: Family History  Problem Relation Age of Onset  . Hypertension Father   . Heart attack Father     SOCIAL HISTORY: Social History   Socioeconomic History  . Marital status: Single    Spouse name: Not on file  . Number of children: Not on file  . Years of education: Not on file  . Highest education level: Not on file  Occupational History  . Not on file  Social Needs  . Financial resource strain: Not on file  . Food insecurity:    Worry: Not on file    Inability: Not on file  . Transportation needs:    Medical: Not on file    Non-medical: Not on file  Tobacco Use  . Smoking status: Former Smoker    Packs/day: 3.00    Years: 20.00    Pack years: 60.00    Last attempt to quit: 2000    Years since quitting: 19.5  . Smokeless tobacco: Never Used  Substance and Sexual Activity  . Alcohol use: No    Alcohol/week: 0.0 oz  . Drug use: No  . Sexual activity: Yes  Lifestyle  . Physical activity:    Days per week: Not on file    Minutes  per session: Not on file  . Stress: Not on file  Relationships  . Social connections:    Talks on phone: Not on file    Gets together: Not on file    Attends religious service: Not on file    Active member of club or organization: Not on file    Attends meetings of clubs or organizations: Not on file    Relationship status: Not on file  . Intimate partner violence:    Fear of current or ex partner: Not on file    Emotionally abused: Not on file    Physically abused: Not on file  Forced sexual activity: Not on file  Other Topics Concern  . Not on file  Social History Narrative  . Not on file     PHYSICAL EXAM  Vitals:   08/01/17 0902  BP: (!) 157/72  Pulse: 79  Weight: 182 lb 6.4 oz (82.7 kg)  Height: 5' 1.5" (1.562 m)   Body mass index is 33.91 kg/m.  Generalized: Well developed, obese female in no acute distress  Head: normocephalic and atraumatic,. Oropharynx benign, mallopatti 5  Neck: Supple, circumference 15.5 Lungs clear Musculoskeletal: No deformity  Skin no peripheral edema  Neurological examination   Mentation: Alert oriented to time, place, history taking. Attention span and concentration appropriate. Recent and remote memory intact.  Follows all commands speech and language fluent.   Cranial nerve II-XII: .Pupils were equal round reactive to light extraocular movements were full, visual field were full on confrontational test. Facial sensation and strength were normal. hearing was intact to finger rubbing bilaterally. Uvula tongue midline. head turning and shoulder shrug were normal and symmetric.Tongue protrusion into cheek strength was normal. Motor: normal bulk and tone, full strength in the BUE, BLE, Sensory: normal and symmetric to light touch,   Coordination: finger-nose-finger, heel-to-shin bilaterally, no dysmetria Gait and Station: Rising up from seated position without assistance, normal stance,  moderate stride, good arm swing, smooth turning,  able to perform tiptoe, and heel walking without difficulty. Tandem gait is steady  DIAGNOSTIC DATA (LABS, IMAGING, TESTING) - I reviewed patient records, labs, notes, testing and imaging myself where available.  Lab Results  Component Value Date   WBC 9.6 12/24/2016   HGB 13.1 12/24/2016   HCT 39.1 12/24/2016   MCV 91 12/24/2016   PLT 368 12/24/2016      Component Value Date/Time   NA 144 12/24/2016 0926   K 2.9 (L) 12/24/2016 0926   CL 101 12/24/2016 0926   CO2 26 12/24/2016 0926   GLUCOSE 136 (H) 12/24/2016 0926   GLUCOSE 125 (H) 10/08/2014 1806   BUN 14 12/24/2016 0926   CREATININE 0.93 12/24/2016 0926   CREATININE 0.89 03/14/2014 1146   CALCIUM 9.3 12/24/2016 0926   PROT 7.0 12/24/2016 0926   ALBUMIN 4.3 12/24/2016 0926   AST 19 12/24/2016 0926   ALT 12 12/24/2016 0926   ALKPHOS 92 12/24/2016 0926   BILITOT 0.5 12/24/2016 0926   GFRNONAA 66 12/24/2016 0926   GFRNONAA 71 03/14/2014 1146   GFRAA 76 12/24/2016 0926   GFRAA 81 03/14/2014 1146   Lab Results  Component Value Date   CHOL 252 (H) 03/08/2016   HDL 47 03/08/2016   LDLCALC 142 (H) 03/08/2016   TRIG 316 (H) 03/08/2016   CHOLHDL 5.4 (H) 03/08/2016   Lab Results  Component Value Date   HGBA1C 5.9 (H) 12/24/2016    Lab Results  Component Value Date   TSH 2.290 12/24/2016     ASSESSMENT AND PLAN  62 y.o. year old female  has a past medical history of Depression, Hypertension, and [name]. here to follow-up for initial CPAP compliance.Data dated 04/29/2017-07/27/2017 which is 3 months shows compliance greater than 4 hours for 9 days at 10%.  Average usage 5 hours 35 minutes.  Set pressure 5 to 15 cm EPR level 3 AHI 5.1 ESS 18   CPAP compliance 10%, needs to be greater than 70 continue same settings Follow-up in 3 months for repeat compliance Dennie Bible, [facility], [facility], APRN  [facility] [address] [place], [place]  [address] [phone] 

## 2017-08-01 ENCOUNTER — Ambulatory Visit: Payer: Managed Care, Other (non HMO) | Admitting: Nurse Practitioner

## 2017-08-01 ENCOUNTER — Encounter: Payer: Self-pay | Admitting: Nurse Practitioner

## 2017-08-01 DIAGNOSIS — Z9989 Dependence on other enabling machines and devices: Secondary | ICD-10-CM

## 2017-08-01 DIAGNOSIS — G4733 Obstructive sleep apnea (adult) (pediatric): Secondary | ICD-10-CM

## 2017-08-01 NOTE — Patient Instructions (Signed)
CPAP compliance 10%, needs to be greater than 70 continue same settings Follow-up in 3 months

## 2017-08-19 NOTE — Progress Notes (Signed)
I agree with the assessment and plan as directed by NP .The patient is known to me .   April Haydu, MD  

## 2017-09-13 ENCOUNTER — Other Ambulatory Visit: Payer: Self-pay | Admitting: Physician Assistant

## 2017-09-13 NOTE — Telephone Encounter (Signed)
Klor-Con M20 meq  refill Last Refill:01/03/17 # 30 with 3 refills Last OV: 04/23/17 PCP: April Stevens Pharmacy:[facility] [phone]  Last K+ was 2.9 on 12/24/17   No office visit scheduled. Prescription not refilled.

## 2017-11-09 NOTE — Progress Notes (Deleted)
[facility]  PATIENT: April Stevens DOB: 11-24-1955   REASON FOR VISIT: Follow-up for newly diagnosed obstructive sleep apnea here for initial CPAP compliance  HISTORY FROM: patient    HISTORY OF PRESENT ILLNESS:UPDATE 7/15/2019CM April Stevens, 62 year old female returns for follow-up with newly diagnosed obstructive sleep apnea here for initial CPAP compliance.  Patient states she is doing well with the machine no issues however she forgets to put it on at night when she goes to bed.  Compliance data dated 04/29/2017-07/27/2017 which is 3 months shows compliance greater than 4 hours for 9 days at 10%.  Average usage 5 hours 35 minutes.  Set pressure 5 to 15 cm EPR level 3 AHI 5.1 ESS 18.  She returns for reevaluation    2/5/19CDDebra L Stevens is a 62 y.o. female , seen here as referral patient from [place]. [facility] for a sleep evaluation.   Chief complaint according to patient : I have the pleasure of seeing April Stevens today, a 62 year old Caucasian right-handed female patient referred by [facility].  The patient reports that she had a sleep study about 15 years ago obstructive sleep apnea and used CPAP for several years.  She has not uses CPAP for at least 6 or 7 years.  She is not sure if she still has obstructive sleep apnea, but  feels tired and excessively daytime sleepy. She has been told that she snores very , very loud and that she stops breathing - witnessed by her female partner.   The patient carries a diagnosis of hypertension currently only elevated blood pressures, hypercholesterolemia with a total cholesterol of 200 hypokalemia as of March 08, 2016 transient.  The creatinine level is 0.77 per labs, BP from December 2018 was 138/70 mmHg.  Triglycerides are very high at 316, on chlorthalidone, fluoxetine, baby aspirin which are all 5 filled through physician assistant Philis Fendt.  His referral also noted that Xanax had been discontinued ,  as had Flexeril , hydrocodone , Claritin , methylprednisolone injections have been discontinued, Naprosyn was discontinued, and  Percocet has been discontinued.  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  She usually feels still wound up and wants to clean the house or do things around the house before she will settle for bedtime.  She will only retreat to her bedroom with the TV on in the background, reports that she cannot sleep without the TV.  The TV is set at a timer -she is never awake when it cuts off, an is unsure as to what the settings are. Bedroom is " OK" ,at 71 degrees , she has still hot flushes, she has light clocking curtains and the TV on... She falls asleep usually on her side, wakes up on her back.  She just intvested in a new mattress, the bed is not adjustable. She is sleeping on 2 pillows , one for head support. She sleeps alone with her dog. The dog wakes her at about 7 AM to be let out of the home.  She feels a little groggy craves more sleep when she wakes up at that time.She estimates her total nocturnal sleep time to be 3 or 4 hours only. In daytime she has no trouble initiating sleep, if she has the opportunity to take a nap she will fall asleep very promptly stay asleep for  3-4 hours, not feel very refreshed after daytime naps. She leaves the house at 1.45 PM and works 40 hours weekly, full time.     REVIEW OF SYSTEMS: Full 14 system review of systems performed and notable only for those listed, all others are neg:  Constitutional: neg  Cardiovascular: neg Ear/Nose/Throat: neg  Skin: neg Eyes: neg Respiratory: neg Gastroitestinal: neg  Hematology/Lymphatic: neg  Endocrine: neg Musculoskeletal:neg Allergy/Immunology: neg Neurological: neg Psychiatric: neg Sleep : Obstructive sleep apnea with CPAP  ALLERGIES: No Known Allergies  HOME MEDICATIONS: Outpatient  Medications Prior to Visit  Medication Sig Dispense Refill  . aspirin 81 MG tablet Take 1 tablet (81 mg total) by mouth daily. 365 tablet 0  . benzonatate (TESSALON) 100 MG capsule Take 1 capsule (100 mg total) by mouth 2 (two) times daily as needed for cough. (Patient not taking: Reported on 04/23/2017) 20 capsule 0  . chlorthalidone (HYGROTON) 25 MG tablet Take 1 tablet (25 mg total) by mouth daily. 90 tablet 3  . FLUoxetine (PROZAC) 20 MG capsule Take 3 capsules (60 mg total) by mouth every morning. 270 capsule 3  . KLOR-CON M20 20 MEQ tablet TAKE 0.5 TABLETS (10 MEQ TOTAL) BY MOUTH DAILY. 90 tablet 1   No facility-administered medications prior to visit.     PAST MEDICAL HISTORY: Past Medical History:  Diagnosis Date  . Depression   . Hypertension   . [name]     PAST SURGICAL HISTORY: Past Surgical History:  Procedure Laterality Date  . JOINT REPLACEMENT    . SHOULDER SURGERY      FAMILY HISTORY: Family History  Problem Relation Age of Onset  . Hypertension Father   . Heart attack Father     SOCIAL HISTORY: Social History   Socioeconomic History  . Marital status: Single    Spouse name: Not on file  . Number of children: Not on file  . Years of education: Not on file  . Highest education level: Not on file  Occupational History  . Not on file  Social Needs  . Financial resource strain: Not on file  . Food insecurity:    Worry: Not on file    Inability: Not on file  . Transportation needs:    Medical: Not on file    Non-medical: Not on file  Tobacco Use  . Smoking status: Former Smoker    Packs/day: 3.00    Years: 20.00    Pack years: 60.00    Last attempt to quit: 2000    Years since quitting: 19.8  . Smokeless tobacco: Never Used  Substance and Sexual Activity  . Alcohol use: No    Alcohol/week: 0.0 standard drinks  . Drug use: No  . Sexual activity: Yes  Lifestyle  . Physical activity:    Days per week: Not on file    Minutes per session:  Not on file  . Stress: Not on file  Relationships  . Social connections:    Talks on phone: Not on file    Gets together: Not on file    Attends religious service: Not on file    Active member of club or organization: Not on file    Attends meetings of clubs or organizations: Not on file    Relationship status: Not on file  . Intimate partner violence:    Fear of current or ex partner: Not on file    Emotionally abused: Not on file    Physically abused: Not on file  Forced sexual activity: Not on file  Other Topics Concern  . Not on file  Social History Narrative  . Not on file     PHYSICAL EXAM  There were no vitals filed for this visit. There is no height or weight on file to calculate BMI.  Generalized: Well developed, obese female in no acute distress  Head: normocephalic and atraumatic,. Oropharynx benign, mallopatti 5  Neck: Supple, circumference 15.5 Lungs clear Musculoskeletal: No deformity  Skin no peripheral edema  Neurological examination   Mentation: Alert oriented to time, place, history taking. Attention span and concentration appropriate. Recent and remote memory intact.  Follows all commands speech and language fluent.   Cranial nerve II-XII: .Pupils were equal round reactive to light extraocular movements were full, visual field were full on confrontational test. Facial sensation and strength were normal. hearing was intact to finger rubbing bilaterally. Uvula tongue midline. head turning and shoulder shrug were normal and symmetric.Tongue protrusion into cheek strength was normal. Motor: normal bulk and tone, full strength in the BUE, BLE, Sensory: normal and symmetric to light touch,   Coordination: finger-nose-finger, heel-to-shin bilaterally, no dysmetria Gait and Station: Rising up from seated position without assistance, normal stance,  moderate stride, good arm swing, smooth turning, able to perform tiptoe, and heel walking without difficulty.  Tandem gait is steady  DIAGNOSTIC DATA (LABS, IMAGING, TESTING) - I reviewed patient records, labs, notes, testing and imaging myself where available.  Lab Results  Component Value Date   WBC 9.6 12/24/2016   HGB 13.1 12/24/2016   HCT 39.1 12/24/2016   MCV 91 12/24/2016   PLT 368 12/24/2016      Component Value Date/Time   NA 144 12/24/2016 0926   K 2.9 (L) 12/24/2016 0926   CL 101 12/24/2016 0926   CO2 26 12/24/2016 0926   GLUCOSE 136 (H) 12/24/2016 0926   GLUCOSE 125 (H) 10/08/2014 1806   BUN 14 12/24/2016 0926   CREATININE 0.93 12/24/2016 0926   CREATININE 0.89 03/14/2014 1146   CALCIUM 9.3 12/24/2016 0926   PROT 7.0 12/24/2016 0926   ALBUMIN 4.3 12/24/2016 0926   AST 19 12/24/2016 0926   ALT 12 12/24/2016 0926   ALKPHOS 92 12/24/2016 0926   BILITOT 0.5 12/24/2016 0926   GFRNONAA 66 12/24/2016 0926   GFRNONAA 71 03/14/2014 1146   GFRAA 76 12/24/2016 0926   GFRAA 81 03/14/2014 1146   Lab Results  Component Value Date   CHOL 252 (H) 03/08/2016   HDL 47 03/08/2016   LDLCALC 142 (H) 03/08/2016   TRIG 316 (H) 03/08/2016   CHOLHDL 5.4 (H) 03/08/2016   Lab Results  Component Value Date   HGBA1C 5.9 (H) 12/24/2016    Lab Results  Component Value Date   TSH 2.290 12/24/2016     ASSESSMENT AND PLAN  62 y.o. year old female  has a past medical history of Depression, Hypertension, and [name]. here to follow-up for initial CPAP compliance.Data dated 04/29/2017-07/27/2017 which is 3 months shows compliance greater than 4 hours for 9 days at 10%.  Average usage 5 hours 35 minutes.  Set pressure 5 to 15 cm EPR level 3 AHI 5.1 ESS 18   CPAP compliance 10%, needs to be greater than 70 continue same settings Follow-up in 3 months for repeat compliance April Stevens, [facility], [facility], APRN  [facility] [address] [place], [place] [address] [phone]

## 2017-11-10 ENCOUNTER — Ambulatory Visit: Payer: Managed Care, Other (non HMO) | Admitting: Nurse Practitioner

## 2017-11-11 ENCOUNTER — Encounter: Payer: Self-pay | Admitting: Nurse Practitioner

## 2017-12-17 ENCOUNTER — Other Ambulatory Visit: Payer: Self-pay | Admitting: Physician Assistant

## 2017-12-17 DIAGNOSIS — I10 Essential (primary) hypertension: Secondary | ICD-10-CM

## 2018-02-27 ENCOUNTER — Encounter: Payer: Self-pay | Admitting: Nurse Practitioner

## 2018-02-28 NOTE — Progress Notes (Signed)
[facility]  PATIENT: April Stevens DOB: 1955-08-01   REASON FOR VISIT: Follow-up for obstructive sleep apnea here for CPAP compliance  HISTORY FROM: patient    HISTORY OF PRESENT ILLNESS:UPDATE 2/12/2020CM April Stevens 63 year old female returns for follow-up with history of obstructive sleep apnea here for CPAP compliance she has done well since last seen.  Compliance data dated 01/29/2018-02/27/2018 shows compliance greater than 4 hours at 93%.  Average usage 6 hours 46 minutes.  Set pressure 5 to 15 cm.  EPR level 3 leaks 95th percentile 49.9 AHI 4.3.  Patient is currently on leave of absence from  work since shoulder surgery she returns for reevaluation    UPDATE 7/15/2019CM April Stevens, 63 year old female returns for follow-up with newly diagnosed obstructive sleep apnea here for initial CPAP compliance.  Patient states she is doing well with the machine no issues however she forgets to put it on at night when she goes to bed.  Compliance data dated 04/29/2017-07/27/2017 which is 3 months shows compliance greater than 4 hours for 9 days at 10%.  Average usage 5 hours 35 minutes.  Set pressure 5 to 15 cm EPR level 3 AHI 5.1 ESS 18.  She returns for reevaluation    2/5/19CDDebra L Stevens is a 63 y.o. female , seen here as referral patient from [place] for a sleep evaluation.   Chief complaint according to patient : I have the pleasure of seeing April Stevens today, a 63 year old Caucasian right-handed female patient referred by [facility].  The patient reports that she had a sleep study about 15 years ago obstructive sleep apnea and used CPAP for several years.  She has not uses CPAP for at least 6 or 7 years.  She is not sure if she still has obstructive sleep apnea, but  feels tired and excessively daytime sleepy. She has been told that she snores very , very loud and that she stops breathing - witnessed by her female partner.   The patient carries  a diagnosis of hypertension currently only elevated blood pressures, hypercholesterolemia with a total cholesterol of 200 hypokalemia as of March 08, 2016 transient.  The creatinine level is 0.77 per labs, BP from December 2018 was 138/70 mmHg.  Triglycerides are very high at 316, on chlorthalidone, fluoxetine, baby aspirin which are all 5 filled through physician assistant Philis Fendt.  His referral also noted that Xanax had been discontinued , as had Flexeril , hydrocodone , Claritin , methylprednisolone injections have been discontinued, Naprosyn was discontinued, and  Percocet has been discontinued.  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  She usually feels still wound up and wants to clean the house or do things around the house before she will settle for bedtime.  She will only retreat to her bedroom with the TV on in the background, reports that she cannot sleep without the TV.  The TV is set at a timer -she is never awake when it cuts off, an is unsure as to what the settings are. Bedroom is " OK" ,at 71 degrees , she has still hot flushes, she has light clocking curtains and the TV on... She falls asleep usually on her side, wakes up on her back.  She just intvested in a new mattress, the bed is not adjustable. She is sleeping on 2 pillows ,  one for head support. She sleeps alone with her dog. The dog wakes her at about 7 AM to be let out of the home.  She feels a little groggy craves more sleep when she wakes up at that time.She estimates her total nocturnal sleep time to be 3 or 4 hours only. In daytime she has no trouble initiating sleep, if she has the opportunity to take a nap she will fall asleep very promptly stay asleep for 3-4 hours, not feel very refreshed after daytime naps. She leaves the house at 1.45 PM and works 40 hours weekly, full time.     REVIEW OF SYSTEMS: Full 14  system review of systems performed and notable only for those listed, all others are neg:  Constitutional: neg  Cardiovascular: neg Ear/Nose/Throat: neg  Skin: neg Eyes: neg Respiratory: neg Gastroitestinal: neg  Hematology/Lymphatic: neg  Endocrine: neg Musculoskeletal: Recent left shoulder surgery Allergy/Immunology: neg Neurological: neg Psychiatric: neg Sleep : Obstructive sleep apnea with CPAP  ALLERGIES: No Known Allergies  HOME MEDICATIONS: Outpatient Medications Prior to Visit  Medication Sig Dispense Refill  . aspirin 81 MG tablet Take 1 tablet (81 mg total) by mouth daily. 365 tablet 0  . chlorthalidone (HYGROTON) 25 MG tablet TAKE 1 TABLET BY MOUTH EVERY DAY 30 tablet 0  . FLUoxetine (PROZAC) 20 MG capsule Take 3 capsules (60 mg total) by mouth every morning. 270 capsule 3  . KLOR-CON M20 20 MEQ tablet TAKE 0.5 TABLETS (10 MEQ TOTAL) BY MOUTH DAILY. 90 tablet 1  . benzonatate (TESSALON) 100 MG capsule Take 1 capsule (100 mg total) by mouth 2 (two) times daily as needed for cough. (Patient not taking: Reported on 04/23/2017) 20 capsule 0   No facility-administered medications prior to visit.     PAST MEDICAL HISTORY: Past Medical History:  Diagnosis Date  . Depression   . Hypertension   . [name]     PAST SURGICAL HISTORY: Past Surgical History:  Procedure Laterality Date  . JOINT REPLACEMENT    . SHOULDER SURGERY Left    01-02-2018, has had R shoulder rotator cuff previously    FAMILY HISTORY: Family History  Problem Relation Age of Onset  . Hypertension Father   . Heart attack Father     SOCIAL HISTORY: Social History   Socioeconomic History  . Marital status: Single    Spouse name: Not on file  . Number of children: Not on file  . Years of education: Not on file  . Highest education level: Not on file  Occupational History  . Not on file  Social Needs  . Financial resource strain: Not on file  . Food insecurity:    Worry: Not on file      Inability: Not on file  . Transportation needs:    Medical: Not on file    Non-medical: Not on file  Tobacco Use  . Smoking status: Former Smoker    Packs/day: 3.00    Years: 20.00    Pack years: 60.00    Last attempt to quit: 2000    Years since quitting: 20.1  . Smokeless tobacco: Never Used  Substance and Sexual Activity  . Alcohol use: No    Alcohol/week: 0.0 standard drinks  . Drug use: No  . Sexual activity: Yes  Lifestyle  . Physical activity:    Days per week: Not on file    Minutes per session: Not on file  . Stress: Not on file  Relationships  . Social  connections:    Talks on phone: Not on file    Gets together: Not on file    Attends religious service: Not on file    Active member of club or organization: Not on file    Attends meetings of clubs or organizations: Not on file    Relationship status: Not on file  . Intimate partner violence:    Fear of current or ex partner: Not on file    Emotionally abused: Not on file    Physically abused: Not on file    Forced sexual activity: Not on file  Other Topics Concern  . Not on file  Social History Narrative  . Not on file     PHYSICAL EXAM  Vitals:   03/01/18 1401  BP: (!) 150/87  Pulse: 79  Weight: 193 lb (87.5 kg)  Height: 5' 1.5" (1.562 m)   Body mass index is 35.88 kg/m.  Generalized: Well developed, obese female in no acute distress  Head: normocephalic and atraumatic,. Oropharynx benign, mallopatti 5  Neck: Supple, circumference 16 Lungs clear Musculoskeletal: No deformity  Skin no peripheral edema  Neurological examination   Mentation: Alert oriented to time, place, history taking. Attention span and concentration appropriate. Recent and remote memory intact.  Follows all commands speech and language fluent.   Cranial nerve II-XII: .Pupils were equal round reactive to light extraocular movements were full, visual field were full on confrontational test. Facial sensation and strength  were normal. hearing was intact to finger rubbing bilaterally. Uvula tongue midline. head turning and shoulder shrug were normal and symmetric.Tongue protrusion into cheek strength was normal. Motor: normal bulk and tone, full strength in the BUE, BLE, Sensory: normal and symmetric to light touch,   Coordination: finger-nose-finger, heel-to-shin bilaterally, no dysmetria Gait and Station: Rising up from seated position without assistance, normal stance,  moderate stride, good arm swing, smooth turning, able to perform tiptoe, and heel walking without difficulty. Tandem gait is steady  DIAGNOSTIC DATA (LABS, IMAGING, TESTING) - I reviewed patient records, labs, notes, testing and imaging myself where available.  Lab Results  Component Value Date   WBC 9.6 12/24/2016   HGB 13.1 12/24/2016   HCT 39.1 12/24/2016   MCV 91 12/24/2016   PLT 368 12/24/2016      Component Value Date/Time   NA 144 12/24/2016 0926   K 2.9 (L) 12/24/2016 0926   CL 101 12/24/2016 0926   CO2 26 12/24/2016 0926   GLUCOSE 136 (H) 12/24/2016 0926   GLUCOSE 125 (H) 10/08/2014 1806   BUN 14 12/24/2016 0926   CREATININE 0.93 12/24/2016 0926   CREATININE 0.89 03/14/2014 1146   CALCIUM 9.3 12/24/2016 0926   PROT 7.0 12/24/2016 0926   ALBUMIN 4.3 12/24/2016 0926   AST 19 12/24/2016 0926   ALT 12 12/24/2016 0926   ALKPHOS 92 12/24/2016 0926   BILITOT 0.5 12/24/2016 0926   GFRNONAA 66 12/24/2016 0926   GFRNONAA 71 03/14/2014 1146   GFRAA 76 12/24/2016 0926   GFRAA 81 03/14/2014 1146   Lab Results  Component Value Date   CHOL 252 (H) 03/08/2016   HDL 47 03/08/2016   LDLCALC 142 (H) 03/08/2016   TRIG 316 (H) 03/08/2016   CHOLHDL 5.4 (H) 03/08/2016   Lab Results  Component Value Date   HGBA1C 5.9 (H) 12/24/2016    Lab Results  Component Value Date   TSH 2.290 12/24/2016     ASSESSMENT AND PLAN  62 y.o. year old female  has a  past medical history of Depression, Hypertension, and [name]. here to  follow-up for  CPAP compliance. Data dated 01/29/2018-02/27/2018 shows compliance greater than 4 hours at 93%.  Average usage 6 hours 46 minutes.  Set pressure 5 to 15 cm.  EPR level 3 leaks 95th percentile 49.9 AHI 4.3.    PLAN: CPAP compliance 93% continue same settings Follow-up yearly for repeat compliance does not need supplies April Stevens, [facility], [facility], APRN  [facility] [address], [place], [place] [address] [phone]

## 2018-03-01 ENCOUNTER — Ambulatory Visit: Payer: Managed Care, Other (non HMO) | Admitting: Nurse Practitioner

## 2018-03-01 ENCOUNTER — Encounter: Payer: Self-pay | Admitting: Nurse Practitioner

## 2018-03-01 VITALS — BP 150/87 | HR 79 | Ht 61.5 in | Wt 193.0 lb

## 2018-03-01 DIAGNOSIS — G4733 Obstructive sleep apnea (adult) (pediatric): Secondary | ICD-10-CM

## 2018-03-01 DIAGNOSIS — Z9989 Dependence on other enabling machines and devices: Secondary | ICD-10-CM | POA: Diagnosis not present

## 2018-03-01 NOTE — Patient Instructions (Signed)
CPAP compliance 93% continue same settings Follow-up yearly for repeat compliance

## 2018-03-17 ENCOUNTER — Other Ambulatory Visit: Payer: Self-pay | Admitting: Physician Assistant

## 2018-03-17 DIAGNOSIS — F32A Depression, unspecified: Secondary | ICD-10-CM

## 2018-03-17 DIAGNOSIS — F329 Major depressive disorder, single episode, unspecified: Secondary | ICD-10-CM

## 2018-03-17 DIAGNOSIS — F419 Anxiety disorder, unspecified: Principal | ICD-10-CM

## 2018-05-30 ENCOUNTER — Other Ambulatory Visit: Payer: Self-pay | Admitting: Physician Assistant

## 2018-05-30 DIAGNOSIS — I10 Essential (primary) hypertension: Secondary | ICD-10-CM

## 2018-07-11 ENCOUNTER — Telehealth: Payer: Self-pay | Admitting: Physician Assistant

## 2018-07-11 NOTE — Telephone Encounter (Signed)
Medication Refill - Medication:FLUoxetine (PROZAC) 20 MG capsule ,KLOR-CON M20 20 MEQ tablet,chlorthalidone (HYGROTON) 25 MG tablet   (Patient has only 3 days left of her medications.)    Has the patient contacted their pharmacy? Yes (Agent: If no, request that the patient contact the pharmacy for the refill.) (Agent: If yes, when and what did the pharmacy advise?)Contact PCP  Preferred Pharmacy (with phone number or street name):  [id number] - [place], [place] [phone] (Phone) [phone] (Fax)     Agent: Please be advised that RX refills may take up to 3 business days. We ask that you follow-up with your pharmacy.

## 2018-07-12 ENCOUNTER — Other Ambulatory Visit: Payer: Self-pay

## 2018-07-12 DIAGNOSIS — F419 Anxiety disorder, unspecified: Secondary | ICD-10-CM

## 2018-07-12 DIAGNOSIS — F32A Depression, unspecified: Secondary | ICD-10-CM

## 2018-07-12 DIAGNOSIS — I10 Essential (primary) hypertension: Secondary | ICD-10-CM

## 2018-07-12 DIAGNOSIS — F329 Major depressive disorder, single episode, unspecified: Secondary | ICD-10-CM

## 2018-07-12 MED ORDER — FLUOXETINE HCL 20 MG PO CAPS: 60.0000 mg | ORAL_CAPSULE | ORAL | 0 refills | Status: DC

## 2018-07-12 MED ORDER — CHLORTHALIDONE 25 MG PO TABS: 25.0000 mg | ORAL_TABLET | Freq: Every day | ORAL | 0 refills | Status: DC

## 2018-07-12 MED ORDER — POTASSIUM CHLORIDE CRYS ER 20 MEQ PO TBCR: EXTENDED_RELEASE_TABLET | ORAL | 0 refills | Status: DC

## 2018-07-12 NOTE — Telephone Encounter (Signed)
See 07/12/2018 telephone note. Dgaddy, CMA

## 2018-07-12 NOTE — Progress Notes (Signed)
Refilled chlorthalidone 30 mg #30, fluoxetine 20 mg #90 and potassium chloride 20 MEQ #30 with 0 refills-[facility].  Left message on vm to return my call re: requested medications and pt needs ov and lab visit.  Courtesy refills given today but no more with out scheduled and kept appt. Dgaddy, CMA

## 2018-08-12 ENCOUNTER — Other Ambulatory Visit: Payer: Self-pay | Admitting: Family Medicine

## 2018-08-12 DIAGNOSIS — F419 Anxiety disorder, unspecified: Secondary | ICD-10-CM

## 2018-08-12 DIAGNOSIS — F32A Depression, unspecified: Secondary | ICD-10-CM

## 2018-08-12 DIAGNOSIS — I10 Essential (primary) hypertension: Secondary | ICD-10-CM

## 2018-08-12 DIAGNOSIS — F329 Major depressive disorder, single episode, unspecified: Secondary | ICD-10-CM

## 2018-08-31 ENCOUNTER — Other Ambulatory Visit: Payer: Self-pay | Admitting: Family Medicine

## 2018-08-31 DIAGNOSIS — F32A Depression, unspecified: Secondary | ICD-10-CM

## 2018-08-31 DIAGNOSIS — F329 Major depressive disorder, single episode, unspecified: Secondary | ICD-10-CM

## 2018-08-31 DIAGNOSIS — I10 Essential (primary) hypertension: Secondary | ICD-10-CM

## 2018-09-25 ENCOUNTER — Other Ambulatory Visit: Payer: Self-pay

## 2018-09-25 ENCOUNTER — Encounter (HOSPITAL_COMMUNITY): Payer: Self-pay | Admitting: Emergency Medicine

## 2018-09-25 ENCOUNTER — Observation Stay (HOSPITAL_COMMUNITY)
Admission: EM | Admit: 2018-09-25 | Discharge: 2018-09-26 | Disposition: A | Discharge status: Home / Self Care | Payer: Managed Care, Other (non HMO) | Attending: General Surgery | Admitting: General Surgery

## 2018-09-25 DIAGNOSIS — Z79899 Other long term (current) drug therapy: Secondary | ICD-10-CM | POA: Diagnosis not present

## 2018-09-25 DIAGNOSIS — F329 Major depressive disorder, single episode, unspecified: Secondary | ICD-10-CM | POA: Diagnosis not present

## 2018-09-25 DIAGNOSIS — Z87891 Personal history of nicotine dependence: Secondary | ICD-10-CM | POA: Diagnosis not present

## 2018-09-25 DIAGNOSIS — Z299 Encounter for prophylactic measures, unspecified: Secondary | ICD-10-CM

## 2018-09-25 DIAGNOSIS — R1031 Right lower quadrant pain: Secondary | ICD-10-CM | POA: Diagnosis present

## 2018-09-25 DIAGNOSIS — Z7982 Long term (current) use of aspirin: Secondary | ICD-10-CM | POA: Diagnosis not present

## 2018-09-25 DIAGNOSIS — K3580 Unspecified acute appendicitis: Principal | ICD-10-CM | POA: Insufficient documentation

## 2018-09-25 DIAGNOSIS — G4733 Obstructive sleep apnea (adult) (pediatric): Secondary | ICD-10-CM | POA: Insufficient documentation

## 2018-09-25 DIAGNOSIS — I10 Essential (primary) hypertension: Secondary | ICD-10-CM | POA: Insufficient documentation

## 2018-09-25 LAB — COMPREHENSIVE METABOLIC PANEL
ALT: 15 U/L (ref 0–44)
AST: 17 U/L (ref 15–41)
Albumin: 4 g/dL (ref 3.5–5.0)
Alkaline Phosphatase: 88 U/L (ref 38–126)
Anion gap: 10 (ref 5–15)
BUN: 14 mg/dL (ref 8–23)
CO2: 27 mmol/L (ref 22–32)
Calcium: 9.5 mg/dL (ref 8.9–10.3)
Chloride: 104 mmol/L (ref 98–111)
Creatinine, Ser: 0.82 mg/dL (ref 0.44–1.00)
GFR calc Af Amer: >60 mL/min (ref >60)
GFR calc non Af Amer: >60 mL/min (ref >60)
Glucose, Bld: 143 mg/dL — ABNORMAL HIGH (ref 70–99)
Potassium: 3 mmol/L — ABNORMAL LOW (ref 3.5–5.1)
Sodium: 141 mmol/L (ref 135–145)
Total Bilirubin: 0.6 mg/dL (ref 0.3–1.2)
Total Protein: 7.1 g/dL (ref 6.5–8.1)

## 2018-09-25 LAB — LIPASE, BLOOD: Lipase: 38 U/L (ref 11–51)

## 2018-09-25 LAB — CBC
HCT: 39.5 % (ref 36.0–46.0)
Hemoglobin: 13.7 g/dL (ref 12.0–15.0)
MCH: 30.1 pg (ref 26.0–34.0)
MCHC: 34.7 g/dL (ref 30.0–36.0)
MCV: 86.8 fL (ref 80.0–100.0)
Platelets: 352 10*3/uL (ref 150–400)
RBC: 4.55 MIL/uL (ref 3.87–5.11)
RDW: 13.5 % (ref 11.5–15.5)
WBC: 9.9 10*3/uL (ref 4.0–10.5)
nRBC: 0 % (ref 0.0–0.2)

## 2018-09-25 NOTE — ED Triage Notes (Signed)
Patient reports RLQ abdominal pain onset this afternoon , no emesis or diarrhea , denies fever or chills.

## 2018-09-26 ENCOUNTER — Emergency Department (HOSPITAL_COMMUNITY): Payer: Managed Care, Other (non HMO)

## 2018-09-26 ENCOUNTER — Observation Stay (HOSPITAL_COMMUNITY): Payer: Managed Care, Other (non HMO) | Admitting: Certified Registered"

## 2018-09-26 ENCOUNTER — Encounter (HOSPITAL_COMMUNITY)
Admission: EM | Disposition: A | Discharge status: Home / Self Care | Payer: Self-pay | Source: Home / Self Care | Attending: Emergency Medicine

## 2018-09-26 ENCOUNTER — Encounter (HOSPITAL_COMMUNITY): Payer: Self-pay | Admitting: Radiology

## 2018-09-26 DIAGNOSIS — K3580 Unspecified acute appendicitis: Secondary | ICD-10-CM | POA: Diagnosis present

## 2018-09-26 HISTORY — PX: APPENDECTOMY: SHX54

## 2018-09-26 HISTORY — PX: LAPAROSCOPIC APPENDECTOMY: SHX408

## 2018-09-26 LAB — URINALYSIS, ROUTINE W REFLEX MICROSCOPIC
Bilirubin Urine: NEGATIVE
Glucose, UA: NEGATIVE mg/dL
Ketones, ur: NEGATIVE mg/dL
Leukocytes,Ua: NEGATIVE
Nitrite: NEGATIVE
Protein, ur: NEGATIVE mg/dL
Specific Gravity, Urine: 1.027 (ref 1.005–1.030)
pH: 5 (ref 5.0–8.0)

## 2018-09-26 LAB — SARS CORONAVIRUS 2 BY RT PCR (HOSPITAL ORDER, PERFORMED IN ~~LOC~~ HOSPITAL LAB): SARS Coronavirus 2: NEGATIVE

## 2018-09-26 LAB — HIV ANTIBODY (ROUTINE TESTING W REFLEX): HIV Screen 4th Generation wRfx: NONREACTIVE

## 2018-09-26 LAB — GLUCOSE, CAPILLARY: Glucose-Capillary: 145 mg/dL — ABNORMAL HIGH (ref 70–99)

## 2018-09-26 SURGERY — APPENDECTOMY, LAPAROSCOPIC
Anesthesia: General | Site: Abdomen

## 2018-09-26 MED ORDER — ENOXAPARIN SODIUM 40 MG/0.4ML ~~LOC~~ SOLN
40.0000 mg | SUBCUTANEOUS | Status: DC
  Administered 2018-09-26: 40 mg via SUBCUTANEOUS
  Filled 2018-09-26: qty 0.4

## 2018-09-26 MED ORDER — OXYCODONE HCL 5 MG PO TABS
5.0000 mg | ORAL_TABLET | Freq: Four times a day (QID) | ORAL | Status: DC | PRN
  Administered 2018-09-26: 15:00:00 5 mg via ORAL
  Filled 2018-09-26: qty 1

## 2018-09-26 MED ORDER — PROPOFOL 10 MG/ML IV BOLUS
INTRAVENOUS | Status: DC | PRN
  Administered 2018-09-26: 120 mg via INTRAVENOUS

## 2018-09-26 MED ORDER — SODIUM CHLORIDE 0.9 % IV SOLN: INTRAVENOUS | Status: DC

## 2018-09-26 MED ORDER — METRONIDAZOLE IN NACL 5-0.79 MG/ML-% IV SOLN
500.0000 mg | Freq: Once | INTRAVENOUS | Status: AC
  Administered 2018-09-26: 500 mg via INTRAVENOUS
  Filled 2018-09-26: qty 100

## 2018-09-26 MED ORDER — LIDOCAINE 2% (20 MG/ML) 5 ML SYRINGE
INTRAMUSCULAR | Status: DC | PRN
  Administered 2018-09-26: 80 mg via INTRAVENOUS

## 2018-09-26 MED ORDER — ASPIRIN 81 MG PO TABS: ORAL_TABLET | ORAL | 0 refills | Status: AC

## 2018-09-26 MED ORDER — HYDROMORPHONE HCL 1 MG/ML IJ SOLN: 0.2500 mg | INTRAMUSCULAR | Status: DC | PRN

## 2018-09-26 MED ORDER — 0.9 % SODIUM CHLORIDE (POUR BTL) OPTIME
TOPICAL | Status: DC | PRN
  Administered 2018-09-26: 09:00:00 1000 mL

## 2018-09-26 MED ORDER — ALBUTEROL SULFATE HFA 108 (90 BASE) MCG/ACT IN AERS
INHALATION_SPRAY | RESPIRATORY_TRACT | Status: DC | PRN
  Administered 2018-09-26: 4 via RESPIRATORY_TRACT

## 2018-09-26 MED ORDER — BUPIVACAINE-EPINEPHRINE (PF) 0.5% -1:200000 IJ SOLN
INTRAMUSCULAR | Status: AC
  Filled 2018-09-26: qty 30

## 2018-09-26 MED ORDER — IOHEXOL 300 MG/ML  SOLN
100.0000 mL | Freq: Once | INTRAMUSCULAR | Status: AC | PRN
  Administered 2018-09-26: 100 mL via INTRAVENOUS

## 2018-09-26 MED ORDER — SODIUM CHLORIDE 0.9 % IV SOLN: 2.0000 g | INTRAVENOUS | Status: DC

## 2018-09-26 MED ORDER — PROPOFOL 10 MG/ML IV BOLUS
INTRAVENOUS | Status: AC
  Filled 2018-09-26: qty 40

## 2018-09-26 MED ORDER — SUGAMMADEX SODIUM 200 MG/2ML IV SOLN
INTRAVENOUS | Status: DC | PRN
  Administered 2018-09-26: 200 mg via INTRAVENOUS

## 2018-09-26 MED ORDER — FENTANYL CITRATE (PF) 250 MCG/5ML IJ SOLN
INTRAMUSCULAR | Status: AC
  Filled 2018-09-26: qty 5

## 2018-09-26 MED ORDER — ONDANSETRON HCL 4 MG/2ML IJ SOLN
INTRAMUSCULAR | Status: DC | PRN
  Administered 2018-09-26: 4 mg via INTRAVENOUS

## 2018-09-26 MED ORDER — OXYCODONE HCL 5 MG PO TABS: 5.0000 mg | ORAL_TABLET | Freq: Once | ORAL | Status: DC | PRN

## 2018-09-26 MED ORDER — SODIUM CHLORIDE 0.9 % IV SOLN
INTRAVENOUS | Status: DC
  Administered 2018-09-26: 02:00:00 via INTRAVENOUS

## 2018-09-26 MED ORDER — SODIUM CHLORIDE 0.9 % IV SOLN
1.0000 g | Freq: Once | INTRAVENOUS | Status: AC
  Administered 2018-09-26: 1 g via INTRAVENOUS
  Filled 2018-09-26: qty 10

## 2018-09-26 MED ORDER — BUPIVACAINE-EPINEPHRINE (PF) 0.25% -1:200000 IJ SOLN
INTRAMUSCULAR | Status: AC
  Filled 2018-09-26: qty 30

## 2018-09-26 MED ORDER — ACETAMINOPHEN 500 MG PO TABS: ORAL_TABLET | ORAL | 0 refills | Status: DC

## 2018-09-26 MED ORDER — OXYCODONE HCL 5 MG/5ML PO SOLN: 5.0000 mg | Freq: Once | ORAL | Status: DC | PRN

## 2018-09-26 MED ORDER — NAPROXEN SODIUM 220 MG PO TABS: ORAL_TABLET | ORAL | Status: DC

## 2018-09-26 MED ORDER — MORPHINE SULFATE (PF) 4 MG/ML IV SOLN
4.0000 mg | Freq: Once | INTRAVENOUS | Status: AC
  Administered 2018-09-26: 4 mg via INTRAVENOUS
  Filled 2018-09-26: qty 1

## 2018-09-26 MED ORDER — METRONIDAZOLE IN NACL 5-0.79 MG/ML-% IV SOLN: 500.0000 mg | Freq: Three times a day (TID) | INTRAVENOUS | Status: DC

## 2018-09-26 MED ORDER — ONDANSETRON HCL 4 MG/2ML IJ SOLN
4.0000 mg | Freq: Once | INTRAMUSCULAR | Status: AC
  Administered 2018-09-26: 4 mg via INTRAVENOUS
  Filled 2018-09-26: qty 2

## 2018-09-26 MED ORDER — SUCCINYLCHOLINE CHLORIDE 200 MG/10ML IV SOSY
PREFILLED_SYRINGE | INTRAVENOUS | Status: DC | PRN
  Administered 2018-09-26: 120 mg via INTRAVENOUS

## 2018-09-26 MED ORDER — DEXAMETHASONE SODIUM PHOSPHATE 10 MG/ML IJ SOLN
INTRAMUSCULAR | Status: DC | PRN
  Administered 2018-09-26: 10 mg via INTRAVENOUS

## 2018-09-26 MED ORDER — BUPIVACAINE-EPINEPHRINE 0.25% -1:200000 IJ SOLN
INTRAMUSCULAR | Status: DC | PRN
  Administered 2018-09-26: 18 mL

## 2018-09-26 MED ORDER — ONDANSETRON HCL 4 MG/2ML IJ SOLN
4.0000 mg | Freq: Four times a day (QID) | INTRAMUSCULAR | Status: DC | PRN
  Administered 2018-09-26: 4 mg via INTRAVENOUS
  Filled 2018-09-26: qty 2

## 2018-09-26 MED ORDER — SODIUM CHLORIDE 0.9 % IR SOLN
Status: DC | PRN
  Administered 2018-09-26: 1000 mL

## 2018-09-26 MED ORDER — ONDANSETRON 4 MG PO TBDP: 4.0000 mg | ORAL_TABLET | Freq: Four times a day (QID) | ORAL | Status: DC | PRN

## 2018-09-26 MED ORDER — ROCURONIUM BROMIDE 10 MG/ML (PF) SYRINGE
PREFILLED_SYRINGE | INTRAVENOUS | Status: DC | PRN
  Administered 2018-09-26: 30 mg via INTRAVENOUS

## 2018-09-26 MED ORDER — IBUPROFEN 800 MG PO TABS: 800.0000 mg | ORAL_TABLET | Freq: Three times a day (TID) | ORAL | Status: DC | PRN

## 2018-09-26 MED ORDER — PROMETHAZINE HCL 25 MG/ML IJ SOLN: 6.2500 mg | INTRAMUSCULAR | Status: DC | PRN

## 2018-09-26 MED ORDER — LACTATED RINGERS IV SOLN
INTRAVENOUS | Status: DC | PRN
  Administered 2018-09-26: 09:00:00 via INTRAVENOUS

## 2018-09-26 MED ORDER — OXYCODONE HCL 5 MG PO TABS: 5.0000 mg | ORAL_TABLET | Freq: Four times a day (QID) | ORAL | 0 refills | Status: DC | PRN

## 2018-09-26 MED ORDER — ACETAMINOPHEN 500 MG PO TABS
1000.0000 mg | ORAL_TABLET | Freq: Three times a day (TID) | ORAL | Status: DC
  Administered 2018-09-26: 1000 mg via ORAL
  Filled 2018-09-26: qty 2

## 2018-09-26 MED ORDER — MIDAZOLAM HCL 2 MG/2ML IJ SOLN
INTRAMUSCULAR | Status: AC
  Filled 2018-09-26: qty 2

## 2018-09-26 MED ORDER — MIDAZOLAM HCL 5 MG/5ML IJ SOLN
INTRAMUSCULAR | Status: DC | PRN
  Administered 2018-09-26: 2 mg via INTRAVENOUS

## 2018-09-26 MED ORDER — FENTANYL CITRATE (PF) 250 MCG/5ML IJ SOLN
INTRAMUSCULAR | Status: DC | PRN
  Administered 2018-09-26 (×2): 50 ug via INTRAVENOUS

## 2018-09-26 MED ORDER — MORPHINE SULFATE (PF) 4 MG/ML IV SOLN
4.0000 mg | Freq: Once | INTRAVENOUS | Status: AC
  Administered 2018-09-26: 02:00:00 4 mg via INTRAVENOUS
  Filled 2018-09-26: qty 1

## 2018-09-26 MED ORDER — MEPERIDINE HCL 25 MG/ML IJ SOLN: 6.2500 mg | INTRAMUSCULAR | Status: DC | PRN

## 2018-09-26 MED ORDER — STERILE WATER FOR IRRIGATION IR SOLN
Status: DC | PRN
  Administered 2018-09-26: 1000 mL

## 2018-09-26 MED ORDER — PHENYLEPHRINE 40 MCG/ML (10ML) SYRINGE FOR IV PUSH (FOR BLOOD PRESSURE SUPPORT)
PREFILLED_SYRINGE | INTRAVENOUS | Status: DC | PRN
  Administered 2018-09-26 (×2): 80 ug via INTRAVENOUS
  Administered 2018-09-26: 120 ug via INTRAVENOUS

## 2018-09-26 MED ORDER — HYDRALAZINE HCL 20 MG/ML IJ SOLN: 10.0000 mg | INTRAMUSCULAR | Status: DC | PRN

## 2018-09-26 MED ORDER — HYDROMORPHONE HCL 1 MG/ML IJ SOLN
1.0000 mg | INTRAMUSCULAR | Status: DC | PRN
  Administered 2018-09-26: 1 mg via INTRAVENOUS
  Filled 2018-09-26 (×2): qty 1

## 2018-09-26 MED ORDER — NAPROXEN 250 MG PO TABS
500.0000 mg | ORAL_TABLET | Freq: Two times a day (BID) | ORAL | Status: DC
  Administered 2018-09-26: 19:00:00 500 mg via ORAL
  Filled 2018-09-26: qty 2

## 2018-09-26 MED ORDER — DEXTROSE-NACL 5-0.9 % IV SOLN
INTRAVENOUS | Status: DC
  Administered 2018-09-26 (×2): via INTRAVENOUS

## 2018-09-26 SURGICAL SUPPLY — 40 items
ADH SKN CLS APL DERMABOND .7 (GAUZE/BANDAGES/DRESSINGS) ×1
APL PRP STRL LF DISP 70% ISPRP (MISCELLANEOUS) ×1
APPLIER CLIP 5 13 M/L LIGAMAX5 (MISCELLANEOUS)
APR CLP MED LRG 5 ANG JAW (MISCELLANEOUS)
BAG SPEC RTRVL 10 TROC 200 (ENDOMECHANICALS) ×1
CANISTER SUCT 3000ML PPV (MISCELLANEOUS) ×2 IMPLANT
CHLORAPREP W/TINT 26 (MISCELLANEOUS) ×2 IMPLANT
CLIP APPLIE 5 13 M/L LIGAMAX5 (MISCELLANEOUS) IMPLANT
CLIP VESOLOCK XL 6/CT (CLIP) ×3 IMPLANT
COVER SURGICAL LIGHT HANDLE (MISCELLANEOUS) ×2 IMPLANT
COVER WAND RF STERILE (DRAPES) ×2 IMPLANT
DERMABOND ADVANCED (GAUZE/BANDAGES/DRESSINGS) ×1
DERMABOND ADVANCED .7 DNX12 (GAUZE/BANDAGES/DRESSINGS) ×1 IMPLANT
ELECT REM PT RETURN 9FT ADLT (ELECTROSURGICAL) ×2
ELECTRODE REM PT RTRN 9FT ADLT (ELECTROSURGICAL) ×1 IMPLANT
GLOVE BIOGEL PI IND STRL 7.0 (GLOVE) ×1 IMPLANT
GLOVE BIOGEL PI INDICATOR 7.0 (GLOVE) ×1
GLOVE SURG SS PI 7.0 STRL IVOR (GLOVE) ×3 IMPLANT
GOWN STRL REUS W/ TWL LRG LVL3 (GOWN DISPOSABLE) ×3 IMPLANT
GOWN STRL REUS W/TWL LRG LVL3 (GOWN DISPOSABLE) ×8
GRASPER SUT TROCAR 14GX15 (MISCELLANEOUS) ×2 IMPLANT
KIT BASIN OR (CUSTOM PROCEDURE TRAY) ×2 IMPLANT
KIT TURNOVER KIT B (KITS) ×2 IMPLANT
NEEDLE 22X1 1/2 (OR ONLY) (NEEDLE) ×2 IMPLANT
NS IRRIG 1000ML POUR BTL (IV SOLUTION) ×2 IMPLANT
PAD ARMBOARD 7.5X6 YLW CONV (MISCELLANEOUS) ×4 IMPLANT
POUCH RETRIEVAL ECOSAC 10 (ENDOMECHANICALS) ×1 IMPLANT
POUCH RETRIEVAL ECOSAC 10MM (ENDOMECHANICALS) ×1
SCISSORS LAP 5X35 DISP (ENDOMECHANICALS) ×2 IMPLANT
SET IRRIG TUBING LAPAROSCOPIC (IRRIGATION / IRRIGATOR) ×2 IMPLANT
SET TUBE SMOKE EVAC HIGH FLOW (TUBING) ×2 IMPLANT
SLEEVE ENDOPATH XCEL 5M (ENDOMECHANICALS) ×2 IMPLANT
SPECIMEN JAR SMALL (MISCELLANEOUS) ×2 IMPLANT
SUT MNCRL AB 4-0 PS2 18 (SUTURE) ×2 IMPLANT
TOWEL GREEN STERILE (TOWEL DISPOSABLE) ×2 IMPLANT
TOWEL GREEN STERILE FF (TOWEL DISPOSABLE) ×2 IMPLANT
TRAY LAPAROSCOPIC MC (CUSTOM PROCEDURE TRAY) ×2 IMPLANT
TROCAR XCEL NON-BLD 11X100MML (ENDOMECHANICALS) ×2 IMPLANT
TROCAR XCEL NON-BLD 5MMX100MML (ENDOMECHANICALS) ×2 IMPLANT
WATER STERILE IRR 1000ML POUR (IV SOLUTION) ×2 IMPLANT

## 2018-09-26 NOTE — Discharge Instructions (Signed)
LAPAROSCOPIC SURGERY: POST OP INSTRUCTIONS No lifting over 15 pounds for the next 3 weeks. ######################################################################  EAT Gradually transition to a high fiber diet with a fiber supplement over the next few weeks after discharge.  Start with a pureed / full liquid diet (see below)  WALK Walk an hour a day.  Control your pain to do that.    CONTROL PAIN Control pain so that you can walk, sleep, tolerate sneezing/coughing, go up/down stairs.  HAVE A BOWEL MOVEMENT DAILY Keep your bowels regular to avoid problems.  OK to try a laxative to override constipation.  OK to use an antidairrheal to slow down diarrhea.  Call if not better after 2 tries  CALL IF YOU HAVE PROBLEMS/CONCERNS Call if you are still struggling despite following these instructions. Call if you have concerns not answered by these instructions  ######################################################################    1. DIET: Follow a light bland diet & liquids the first 24 hours after arrival home, such as soup, liquids, starches, etc.  Be sure to drink plenty of fluids.  Quickly advance to a usual solid diet within a few days.  Avoid fast food or heavy meals as your are more likely to get nauseated or have irregular bowels.  A low-fat, high-fiber diet for the rest of your life is ideal.  2. Take your usually prescribed home medications unless otherwise directed.  3. PAIN CONTROL: a. Pain is best controlled by a usual combination of three different methods TOGETHER: i. Ice/Heat ii. Over the counter pain medication iii. Prescription pain medication b. Most patients will experience some swelling and bruising around the incisions.  Ice packs or heating pads (30-60 minutes up to 6 times a day) will help. Use ice for the first few days to help decrease swelling and bruising, then switch to heat to help relax tight/sore spots and speed recovery.  Some people prefer to use ice alone,  heat alone, alternating between ice & heat.  Experiment to what works for you.  Swelling and bruising can take several weeks to resolve.   c. It is helpful to take an over-the-counter pain medication regularly for the first few weeks.  Choose one of the following that works best for you: i. Naproxen (Aleve, etc)  Two 220mg  tabs twice a day ii. Ibuprofen (Advil, etc) Three 200mg  tabs four times a day (every meal & bedtime) iii. Acetaminophen (Tylenol, etc) 500-650mg  four times a day (every meal & bedtime) d. A  prescription for pain medication (such as oxycodone, hydrocodone, tramadol, gabapentin, methocarbamol, etc) should be given to you upon discharge.  Take your pain medication as prescribed.  i. If you are having problems/concerns with the prescription medicine (does not control pain, nausea, vomiting, rash, itching, etc), please call us [phone] to see if we need to switch you to a different pain medicine that will work better for you and/or control your side effect better. ii. If you need a refill on your pain medication, please give [place] 48 hour notice.  contact your pharmacy.  They will contact our office to request authorization. Prescriptions will not be filled after 5 pm or on week-ends  4. Avoid getting constipated.   a. Between the surgery and the pain medications, it is common to experience some constipation.   b. Increasing fluid intake and taking a fiber supplement (such as Metamucil, Citrucel, FiberCon, MiraLax, etc) 1-2 times a day regularly will usually help prevent this problem from occurring.   c. A mild laxative (prune juice, Milk  of Magnesia, MiraLax, etc) should be taken according to package directions if there are no bowel movements after 48 hours.   5. Watch out for diarrhea.   a. If you have many loose bowel movements, simplify your diet to bland foods & liquids for a few days.   b. Stop any stool softeners and decrease your fiber supplement.   c. Switching to mild  anti-diarrheal medications (Kayopectate, Pepto Bismol) can help.   d. If this worsens or does not improve, please call us.  6. Wash / shower every day.  You may shower over the dressings as they are waterproof.  Continue to shower over incision(s) after the dressing is off.  7. Remove your waterproof bandages 5 days after surgery.  You may leave the incision open to air.  You may replace a dressing/Band-Aid to cover the incision for comfort if you wish.   8. ACTIVITIES as tolerated:   a. You may resume regular (light) daily activities beginning the next day--such as daily self-care, walking, climbing stairs--gradually increasing activities as tolerated.  If you can walk 30 minutes without difficulty, it is safe to try more intense activity such as jogging, treadmill, bicycling, low-impact aerobics, swimming, etc. b. Save the most intensive and strenuous activity for last such as sit-ups, heavy lifting, contact sports, etc  Refrain from any heavy lifting or straining until you are off narcotics for pain control.   c. DO NOT PUSH THROUGH PAIN.  Let pain be your guide: If it hurts to do something, don't do it.  Pain is your body warning you to avoid that activity for another week until the pain goes down. d. You may drive when you are no longer taking prescription pain medication, you can comfortably wear a seatbelt, and you can safely maneuver your car and apply brakes. e. Dennis Bast may have sexual intercourse when it is comfortable.  9. FOLLOW UP in our office a. Please call [facility] at [phone] to set up an appointment to see your surgeon in the office for a follow-up appointment approximately 2-3 weeks after your surgery. b. Make sure that you call for this appointment the day you arrive home to insure a convenient appointment time.  10. IF YOU HAVE DISABILITY OR FAMILY LEAVE FORMS, BRING THEM TO THE OFFICE FOR PROCESSING.  DO NOT GIVE THEM TO YOUR DOCTOR.   WHEN TO CALL us [phone]: 1. Poor  pain control 2. Reactions / problems with new medications (rash/itching, nausea, etc)  3. Fever over 101.5 F (38.5 C) 4. Inability to urinate 5. Nausea and/or vomiting 6. Worsening swelling or bruising 7. Continued bleeding from incision. 8. Increased pain, redness, or drainage from the incision   The clinic staff is available to answer your questions during regular business hours (8:30am-5pm).  Please dont hesitate to call and ask to speak to one of our nurses for clinical concerns.   If you have a medical emergency, go to the nearest emergency room or call 911.  A surgeon from [facility] is always on call at the [facility], [place], [place], [place], [place]  [address] ? MAIN: [phone] ? TOLL FREE: [phone] ?  FAX [phone] [email or web address]

## 2018-09-26 NOTE — H&P (Signed)
April Stevens is an 63 y.o. female.   Chief Complaint: Abdominal pain HPI: Patient presents emergency room with a 18-hour history of right lower quadrant abdominal pain.  It started about noon yesterday at work.  She noticed the pain progressed and become more severe location right lower quadrant abdomen.  There is no radiation.  The pain is sharp and made worse with movement.  CT scan showed acute appendicitis.  Past Medical History:  Diagnosis Date  . Depression   . Hypertension   . [name]     Past Surgical History:  Procedure Laterality Date  . JOINT REPLACEMENT    . SHOULDER SURGERY Left    01-02-2018, has had R shoulder rotator cuff previously    Family History  Problem Relation Age of Onset  . Hypertension Father   . Heart attack Father    Social History:  reports that she quit smoking about 20 years ago. She has a 60.00 pack-year smoking history. She has never used smokeless tobacco. She reports that she does not drink alcohol or use drugs.  Allergies: No Known Allergies  (Not in a hospital admission)   Results for orders placed or performed during the hospital encounter of 09/25/18 (from the past 48 hour(s))  Lipase, blood     Status: None   Collection Time: 09/25/18 10:58 PM  Result Value Ref Range   Lipase 38 11 - 51 U/L    Comment: Performed at [facility], [address], [place], [place] [address]  Comprehensive metabolic panel     Status: Abnormal   Collection Time: 09/25/18 10:58 PM  Result Value Ref Range   Sodium 141 135 - 145 mmol/L   Potassium 3.0 (L) 3.5 - 5.1 mmol/L   Chloride 104 98 - 111 mmol/L   CO2 27 22 - 32 mmol/L   Glucose, Bld 143 (H) 70 - 99 mg/dL   BUN 14 8 - 23 mg/dL   Creatinine, Ser 0.82 0.44 - 1.00 mg/dL   Calcium 9.5 8.9 - 10.3 mg/dL   Total Protein 7.1 6.5 - 8.1 g/dL   Albumin 4.0 3.5 - 5.0 g/dL   AST 17 15 - 41 U/L   ALT 15 0 - 44 U/L   Alkaline Phosphatase 88 38 - 126 U/L   Total Bilirubin 0.6 0.3 - 1.2 mg/dL   GFR  calc non Af Amer >60 >60 mL/min   GFR calc Af Amer >60 >60 mL/min   Anion gap 10 5 - 15    Comment: Performed at [facility], [place] [address], [place], [place] [address]  CBC     Status: None   Collection Time: 09/25/18 10:58 PM  Result Value Ref Range   WBC 9.9 4.0 - 10.5 K/uL   RBC 4.55 3.87 - 5.11 MIL/uL   Hemoglobin 13.7 12.0 - 15.0 g/dL   HCT 39.5 36.0 - 46.0 %   MCV 86.8 80.0 - 100.0 fL   MCH 30.1 26.0 - 34.0 pg   MCHC 34.7 30.0 - 36.0 g/dL   RDW 13.5 11.5 - 15.5 %   Platelets 352 150 - 400 K/uL   nRBC 0.0 0.0 - 0.2 %    Comment: Performed at [facility], [place] [address], [place], [place] [address]  Urinalysis, Routine w reflex microscopic     Status: Abnormal   Collection Time: 09/26/18  1:30 AM  Result Value Ref Range   Color, Urine YELLOW YELLOW   APPearance HAZY (  A) CLEAR   Specific Gravity, Urine 1.027 1.005 - 1.030   pH 5.0 5.0 - 8.0   Glucose, UA NEGATIVE NEGATIVE mg/dL   Hgb urine dipstick SMALL (A) NEGATIVE   Bilirubin Urine NEGATIVE NEGATIVE   Ketones, ur NEGATIVE NEGATIVE mg/dL   Protein, ur NEGATIVE NEGATIVE mg/dL   Nitrite NEGATIVE NEGATIVE   Leukocytes,Ua NEGATIVE NEGATIVE   RBC / HPF 6-10 0 - 5 RBC/hpf   WBC, UA 0-5 0 - 5 WBC/hpf   Bacteria, UA RARE (A) NONE SEEN   Squamous Epithelial / LPF 11-20 0 - 5   Mucus PRESENT     Comment: Performed at [facility], [place] [address], [place], [place] [address]   Ct Abdomen Pelvis W Contrast  Result Date: 09/26/2018 CLINICAL DATA:  Right lower quadrant abdominal pain. EXAM: CT ABDOMEN AND PELVIS WITH CONTRAST TECHNIQUE: Multidetector CT imaging of the abdomen and pelvis was performed using the standard protocol following bolus administration of intravenous contrast. CONTRAST:  185mL OMNIPAQUE IOHEXOL 300 MG/ML  SOLN COMPARISON:  None. FINDINGS: Lower chest: The visualized heart size within normal limits. No pericardial fluid/thickening. No hiatal hernia. Streaky atelectasis is seen. Hepatobiliary:  There is a 2 cm lesion seen in the posterior right liver dome with peripheral puddling of contrast, likely hemangioma. There is a tinythe main portal vein is patent. No evidence of calcified gallstones, gallbladder wall thickening or biliary dilatation. Pancreas: Unremarkable. No pancreatic ductal dilatation or surrounding inflammatory changes. Spleen: Normal in size without focal abnormality. Adrenals/Urinary Tract: Both adrenal glands appear normal. The kidneys and collecting system appear normal without evidence of urinary tract calculus or hydronephrosis. Low-density lesion seen at the upper pole the right kidney. Bladder is unremarkable. Stomach/Bowel: The stomach and small bowel are normal in appearance. There is a mildly dilated appendix measuring 9 mm with surrounding mesenteric fat stranding changes. No periappendiceal free fluid or free air is seen however. Vascular/Lymphatic: There are no enlarged mesenteric, retroperitoneal, or pelvic lymph nodes. Scattered aortic atherosclerotic calcifications are seen without aneurysmal dilatation. Reproductive: The patient is status post hysterectomy. No adnexal masses or collections seen. Other: No evidence of abdominal wall mass or hernia. Musculoskeletal: No acute or significant osseous findings. There is a grade 2 anterolisthesis L5 on S1 with chronic bilateral pars defects. IMPRESSION: Findings consistent with acute appendicitis. No periappendiceal free air or free fluid. Electronically Signed   By: Prudencio Pair M.D.   On: 09/26/2018 02:29    Review of Systems  Gastrointestinal: Positive for abdominal pain.  All other systems reviewed and are negative.   Blood pressure (!) 148/81, pulse 72, temperature 98.9 F (37.2 C), temperature source Oral, resp. rate 18, SpO2 95 %. Physical Exam  Constitutional: She is oriented to person, place, and time. She appears well-developed and well-nourished.  HENT:  Head: Normocephalic and atraumatic.  Eyes: Pupils  are equal, round, and reactive to light. EOM are normal.  Neck: Normal range of motion. Neck supple.  Cardiovascular: Normal rate.  Respiratory: Effort normal and breath sounds normal.  GI: There is abdominal tenderness in the right lower quadrant. There is rigidity, guarding and tenderness at McBurney's point.  Musculoskeletal: Normal range of motion.  Neurological: She is alert and oriented to person, place, and time.  Skin: Skin is warm and dry.  Psychiatric: She has a normal mood and affect. Her behavior is normal.     Assessment/Plan Admit for IV fluids, antibiotics and laparoscopic appendectomy by Dr. Kieth Brightly later on this morning.  Discussed  medical management as well as an alternative to surgery.  She agrees to laparoscopic appendectomy.  The procedure was explained to her as well as expected postoperative recovery  Turner Daniels, MD 09/26/2018, 3:06 AM

## 2018-09-26 NOTE — Progress Notes (Signed)
Placed pt. On CPAP of 10cm H2O via nasal mask with 5L O2 bled in. Patient is tolerating CPAP well at this time without any complications.

## 2018-09-26 NOTE — Transfer of Care (Signed)
Immediate Anesthesia Transfer of Care Note  Patient: April Stevens  Procedure(s) Performed: APPENDECTOMY LAPAROSCOPIC (N/A Abdomen)  Patient Location: PACU  Anesthesia Type:General  Level of Consciousness: awake and alert   Airway & Oxygen Therapy: Patient Spontanous Breathing and Patient connected to face mask oxygen  Post-op Assessment: Report given to RN and Post -op Vital signs reviewed and unstable, Anesthesiologist notified  Post vital signs: Reviewed and unstable  Last Vitals:  Vitals Value Taken Time  BP 156/75 09/26/18 0958  Temp    Pulse 90 09/26/18 1002  Resp 18 09/26/18 1002  SpO2 83 % 09/26/18 1002  Vitals shown include unvalidated device data.  Last Pain:  Vitals:   09/26/18 0730  TempSrc:   PainSc: 4          Complications: No apparent anesthesia complications. Patient continuing to have SpO2 in upper 80s. Dr. Sabra Heck aware. Lung sounds clear, nasal trumpet remains in place. Order obtained for CPAP

## 2018-09-26 NOTE — Progress Notes (Signed)
Discharged home today. Personal belongings,discharged instructions given to patient. Advised to pick up medications from pharmacy of choice. Verbalized understanding of instructions

## 2018-09-26 NOTE — Anesthesia Postprocedure Evaluation (Signed)
Anesthesia Post Note  Patient: April Stevens  Procedure(s) Performed: APPENDECTOMY LAPAROSCOPIC (N/A Abdomen)     Patient location during evaluation: PACU Anesthesia Type: General Level of consciousness: awake and alert Pain management: pain level controlled Vital Signs Assessment: post-procedure vital signs reviewed and stable Respiratory status: spontaneous breathing, nonlabored ventilation and respiratory function stable Cardiovascular status: blood pressure returned to baseline and stable Postop Assessment: no apparent nausea or vomiting Anesthetic complications: no    Last Vitals:  Vitals:   09/26/18 1100 09/26/18 1128  BP: 124/72 122/71  Pulse: 77 85  Resp: (!) 9 20  Temp:  36.7 C  SpO2: 96% 94%    Last Pain:  Vitals:   09/26/18 0958  TempSrc:   PainSc: Asleep                 Lynda Rainwater

## 2018-09-26 NOTE — Anesthesia Procedure Notes (Signed)
Procedure Name: Intubation Date/Time: 09/26/2018 8:53 AM Performed by: Imagene Riches, CRNA Pre-anesthesia Checklist: Patient identified, Emergency Drugs available, Suction available and Patient being monitored Patient Re-evaluated:Patient Re-evaluated prior to induction Oxygen Delivery Method: Circle System Utilized Preoxygenation: Pre-oxygenation with 100% oxygen Induction Type: IV induction Ventilation: Mask ventilation without difficulty Laryngoscope Size: Miller and 2 Grade View: Grade I Tube type: Oral Tube size: 7.0 mm Number of attempts: 1 Airway Equipment and Method: Stylet and Oral airway Placement Confirmation: ETT inserted through vocal cords under direct vision,  positive ETCO2 and breath sounds checked- equal and bilateral Secured at: 21 cm Tube secured with: Tape Dental Injury: Teeth and Oropharynx as per pre-operative assessment

## 2018-09-26 NOTE — Anesthesia Preprocedure Evaluation (Signed)
Anesthesia Evaluation  Patient identified by MRN, date of birth, ID band Patient awake    Reviewed: Allergy & Precautions, NPO status , Patient's Chart, lab work & pertinent test results  Airway Mallampati: II  TM Distance: >3 FB Neck ROM: Full    Dental no notable dental hx.    Pulmonary sleep apnea , former smoker,    Pulmonary exam normal breath sounds clear to auscultation       Cardiovascular hypertension, Pt. on medications negative cardio ROS Normal cardiovascular exam Rhythm:Regular Rate:Normal     Neuro/Psych Depression negative neurological ROS  negative psych ROS   GI/Hepatic negative GI ROS, Neg liver ROS,   Endo/Other  negative endocrine ROS  Renal/GU negative Renal ROS  negative genitourinary   Musculoskeletal negative musculoskeletal ROS (+)   Abdominal   Peds negative pediatric ROS (+)  Hematology negative hematology ROS (+)   Anesthesia Other Findings Appendicitis  Reproductive/Obstetrics negative OB ROS                             Anesthesia Physical Anesthesia Plan  ASA: II  Anesthesia Plan: General   Post-op Pain Management:    Induction: Intravenous  PONV Risk Score and Plan: 3 and Ondansetron, Dexamethasone, Midazolam and Treatment may vary due to age or medical condition  Airway Management Planned: Oral ETT  Additional Equipment:   Intra-op Plan:   Post-operative Plan: Extubation in OR  Informed Consent: I have reviewed the patients History and Physical, chart, labs and discussed the procedure including the risks, benefits and alternatives for the proposed anesthesia with the patient or authorized representative who has indicated his/her understanding and acceptance.     Dental advisory given  Plan Discussed with: CRNA  Anesthesia Plan Comments:         Anesthesia Quick Evaluation

## 2018-09-26 NOTE — ED Notes (Signed)
Patient signed consent form for ( appendectomy) surgery .

## 2018-09-26 NOTE — Op Note (Signed)
Preoperative diagnosis: acute appendicitis with peritonitis  Postoperative diagnosis: Same   Procedure: laparoscopic appendectomy  Surgeon: Gurney Maxin, M.D.  Asst: none  Anesthesia: Gen.   Indications for procedure: April Stevens is a 63 y.o. female with symptoms of pain in right lower quadrant and nausea consistent with acute appendicitis. Confirmed by CT scan and laboratory values.  Description of procedure: The patient was brought into the operative suite, placed supine. Anesthesia was administered with endotracheal tube. The patient's left arm was tucked. All pressure points were offloaded by foam padding. The patient was prepped and draped in the usual sterile fashion.  A transverse incision was made to the left of the umbilicus and a 33mm trocar was [place]. Pneumoperitoneum was applied with high flow low pressure.  2 26mm trocars were placed, one in the suprapubic space, one in the LLQ, the periumbilical incision was then up-sized and a 30mm trocar placed in that space. A transversus abdominal block was placed on the left and right sides. Next, the patient was placed in trendelenberg, rotated to the left. The omentum was retracted cephalad. The cecum and appendix were identified. The appendix was slightly dilated with mild inflammation. The base of the appendix was dissected and a window through the mesoappendix was created with blunt dissection. Large Hem-o-lock clips were used to doubly ligate the base of the appendix and mesoappendix. The appendix was cut free with scissors.  The appendix was placed in a specimen bag. The pelvis and RLQ were irrigated. The appendix was removed via the umbilicus. 0 vicryl was used to close the fascial defect. Pneumoperitoneum was removed, all trocars were removed. All incisions were closed with 4-0 monocryl subcuticular stitch. The patient woke from anesthesia and was brought to PACU in stable condition.  Findings: acute appencitis  Specimen:  appendix  Blood loss: 20 ml  Local anesthesia: 20 ml marcaine  Complications: none  Gurney Maxin, M.D. General, Bariatric, & Minimally Invasive Surgery [facility]

## 2018-09-26 NOTE — ED Notes (Signed)
Patient transported to CT scan . 

## 2018-09-26 NOTE — Discharge Summary (Signed)
Physician Discharge Summary  Patient ID: SUSSY TSUJI MRN: [id number] DOB/AGE: 1955/11/23 63 y.o.  Admit date: 09/25/2018 Discharge date: 09/26/2018  Admission Diagnoses:  Acute appendicitis Hx depression Hypertension Obstructive sleep apnea on CPAP  Discharge Diagnoses:  Same  Active Problems:   Acute appendicitis   PROCEDURES:  Laparoscopic appendectomy 09/26/2018, Dr. Lurena Joiner [facility] Course:   Patient presents emergency room with a 18-hour history of right lower quadrant abdominal pain.  It started about noon yesterday at work.  She noticed the pain progressed and become more severe location right lower quadrant abdomen.  There is no radiation.  The pain is sharp and made worse with movement.  CT scan showed acute appendicitis.  Patient was admitted by Dr. Erroll Luna and taken the operating room the following a.m. by Dr. Kieth Brightly.  Patient tolerated the procedure well.  She was mobilized.  Her diet was advanced.  Her pain was controlled with oral pain medications. She was ready for discharge in the PM of 09/26/18.  Condition on DC:  Improved  CBC Latest Ref Rng & Units 09/25/2018 12/24/2016 10/08/2014  WBC 4.0 - 10.5 K/uL 9.9 9.6 8.1  Hemoglobin 12.0 - 15.0 g/dL 13.7 13.1 12.6  Hematocrit 36.0 - 46.0 % 39.5 39.1 36.1  Platelets 150 - 400 K/uL 352 368 302   CMP Latest Ref Rng & Units 09/25/2018 12/24/2016 03/08/2016  Glucose 70 - 99 mg/dL 143(H) 136(H) 116(H)  BUN 8 - 23 mg/dL 14 14 14   Creatinine 0.44 - 1.00 mg/dL 0.82 0.93 0.77  Sodium 135 - 145 mmol/L 141 144 144  Potassium 3.5 - 5.1 mmol/L 3.0(L) 2.9(L) 3.2(L)  Chloride 98 - 111 mmol/L 104 101 98  CO2 22 - 32 mmol/L 27 26 27   Calcium 8.9 - 10.3 mg/dL 9.5 9.3 9.4  Total Protein 6.5 - 8.1 g/dL 7.1 7.0 6.8  Total Bilirubin 0.3 - 1.2 mg/dL 0.6 0.5 0.7  Alkaline Phos 38 - 126 U/L 88 92 99  AST 15 - 41 U/L 17 19 18   ALT 0 - 44 U/L 15 12 13     Disposition: Discharge disposition: 01-Home or Self  Care        Allergies as of 09/26/2018   No Known Allergies     Medication List    TAKE these medications   acetaminophen 500 MG tablet Commonly known as: TYLENOL You can take 1000 mg every 8 hours as needed for pain.  This is your primary pain relief medication.  You can alternate this with Aleve.  Do not take more than 4000 mg of Tylenol/acetaminophen per day, it can harm your liver. You can buy this over-the-counter at any drugstore.   aspirin 81 MG tablet You can restart your baby aspirin 1 daily 09/28/2018. What changed:   how much to take  how to take this  when to take this  additional instructions   chlorthalidone 25 MG tablet Commonly known as: HYGROTON Take 1 tablet (25 mg total) by mouth daily.   FLUoxetine 20 MG capsule Commonly known as: PROzac Take 3 capsules (60 mg total) by mouth every morning. What changed: when to take this   naproxen sodium 220 MG tablet Commonly known as: Aleve You can take 1 or 2 tablets every 12 hours with meals.  You can alternate this with the plain Tylenol/acetaminophen.  Follow package instructions for this medication.  You can buy this over-the-counter at any drugstore.   oxyCODONE 5 MG immediate release tablet Commonly known as: Oxy IR/ROXICODONE Take  1 tablet (5 mg total) by mouth every 6 (six) hours as needed for moderate pain (Pain not relieved by Tylenol and Aleve).   potassium chloride SA 20 MEQ tablet Commonly known as: Klor-Con M20 TAKE 0.5 TABLETS (10 MEQ TOTAL) BY MOUTH DAILY. What changed:   how much to take  how to take this  when to take this  additional instructions      Follow-up Information    Surgery, [place] Follow up on 10/17/2018.   Specialty: General Surgery Why: Your appointment is at 8: 30 a.m.  Be at the office 30 minutes early for check-in.  Bring photo ID and insurance information. Contact information: [place] [address] [phone]         Primary care Follow up.   Why: Call your primary care for follow-up to let them know you had surgery.  Call them for medical issues such as your blood pressure and your sleep apnea.          SignedEarnstine Regal 09/26/2018, 4:19 PM

## 2018-09-26 NOTE — Interval H&P Note (Signed)
History and Physical Interval Note:  09/26/2018 7:51 AM  Traci Sermon  has presented today for surgery, with the diagnosis of Appendicitis.  The various methods of treatment have been discussed with the patient and family. After consideration of risks, benefits and other options for treatment, the patient has consented to  Procedure(s): APPENDECTOMY LAPAROSCOPIC (N/A) as a surgical intervention.  The patient's history has been reviewed, patient examined, no change in status, stable for surgery.  I have reviewed the patient's chart and labs.  Questions were answered to the patient's satisfaction.     Arta Bruce Kinsinger

## 2018-09-26 NOTE — ED Provider Notes (Signed)
TIME SEEN: 1:23 AM  CHIEF COMPLAINT: Right lower quadrant pain  HPI: Patient is a 63 year old female with history of hypertension who presents to the emergency department right lower quadrant pain that started today around noon.  States she noticed the pain was worse when she was walking and when she tries to pull her legs towards her chest.  States it is better when she is staying still.  No fevers, chills but did have some nausea.  No vomiting or diarrhea.  No dysuria, hematuria, vaginal bleeding or discharge.  She is status post hysterectomy and bilateral salpingo-oophorectomy.  States her sister who is a Marine scientist was concerned about appendicitis and recommended she come to the ED.  She denies previous history of kidney stones.  ROS: See HPI Constitutional: no fever  Eyes: no drainage  ENT: no runny nose   Cardiovascular:  no chest pain  Resp: no SOB  GI: no vomiting GU: no dysuria Integumentary: no rash  Allergy: no hives  Musculoskeletal: no leg swelling  Neurological: no slurred speech ROS otherwise negative  PAST MEDICAL HISTORY/PAST SURGICAL HISTORY:  Past Medical History:  Diagnosis Date  . Depression   . Hypertension   . [name]     MEDICATIONS:  Prior to Admission medications   Medication Sig Start Date End Date Taking? Authorizing Provider  aspirin 81 MG tablet Take 1 tablet (81 mg total) by mouth daily. 12/24/16   Tereasa Coop, PA-C  chlorthalidone (HYGROTON) 25 MG tablet Take 1 tablet (25 mg total) by mouth daily. 07/12/18   Forrest Moron, MD  FLUoxetine (PROZAC) 20 MG capsule Take 3 capsules (60 mg total) by mouth every morning. 07/12/18   Delia Chimes A, MD  potassium chloride SA (KLOR-CON M20) 20 MEQ tablet TAKE 0.5 TABLETS (10 MEQ TOTAL) BY MOUTH DAILY. 07/12/18   Forrest Moron, MD    ALLERGIES:  No Known Allergies  SOCIAL HISTORY:  Social History   Tobacco Use  . Smoking status: Former Smoker    Packs/day: 3.00    Years: 20.00    Pack years:  60.00    Quit date: 2000    Years since quitting: 20.7  . Smokeless tobacco: Never Used  Substance Use Topics  . Alcohol use: No    Alcohol/week: 0.0 standard drinks    FAMILY HISTORY: Family History  Problem Relation Age of Onset  . Hypertension Father   . Heart attack Father     EXAM: BP (!) 156/89   Pulse 87   Temp 98.9 F (37.2 C) (Oral)   Resp 19   SpO2 100%  CONSTITUTIONAL: Alert and oriented and responds appropriately to questions. Well-appearing; well-nourished HEAD: Normocephalic EYES: Conjunctivae clear, pupils appear equal, EOMI ENT: normal nose; moist mucous membranes NECK: Supple, no meningismus, no nuchal rigidity, no LAD  CARD: RRR; S1 and S2 appreciated; no murmurs, no clicks, no rubs, no gallops RESP: Normal chest excursion without splinting or tachypnea; breath sounds clear and equal bilaterally; no wheezes, no rhonchi, no rales, no hypoxia or respiratory distress, speaking full sentences ABD/GI: Normal bowel sounds; non-distended; soft, tender to palpation in the right lower quadrant with voluntary guarding, mild rebound tenderness BACK:  The back appears normal and is non-tender to palpation, there is no CVA tenderness EXT: Normal ROM in all joints; non-tender to palpation; no edema; normal capillary refill; no cyanosis, no calf tenderness or swelling    SKIN: Normal color for age and race; warm; no rash NEURO: Moves all extremities  equally PSYCH: The patient's mood and manner are appropriate. Grooming and personal hygiene are appropriate.  MEDICAL DECISION MAKING: Patient here with complaints of right lower quadrant pain.  I am concerned that she has appendicitis.  Labs are unremarkable.  Will obtain CT of the abdomen and pelvis.  Urine pending.  Will give IV fluids, pain and nausea medicine.  Differential also includes kidney stone, colitis, less likely bowel obstruction, perforation, diverticulitis.  ED PROGRESS: CT scan shows uncomplicated acute  appendicitis.  Will discuss with general surgery.  Will give IV Rocephin and Flagyl.  Will send COVID test.   2:50 AM Discussed patient's case with surgery, Dr. Brantley Stage.  I have recommended admission and patient (and family if present) agree with this plan. Admitting physician will place admission orders.   I reviewed all nursing notes, vitals, pertinent previous records, EKGs, lab and urine results, imaging (as available).        , Delice Bison, DO 09/26/18 [phone]

## 2018-09-27 ENCOUNTER — Encounter (HOSPITAL_COMMUNITY): Payer: Self-pay | Admitting: General Surgery

## 2018-11-20 ENCOUNTER — Other Ambulatory Visit: Payer: Self-pay | Admitting: Family Medicine

## 2018-11-20 DIAGNOSIS — I10 Essential (primary) hypertension: Secondary | ICD-10-CM

## 2018-11-20 NOTE — Telephone Encounter (Signed)
Requested medication (s) are due for refill today: yes  Requested medication (s) are on the active medication list: yes  Last refill:  08/12/2018  Future visit scheduled: no  Notes to clinic:  Review for refill Overdue for office visit    Requested Prescriptions  Pending Prescriptions Disp Refills   KLOR-CON M20 20 MEQ tablet [Pharmacy Med Name: KLOR-CON M20 TABLET] 45 tablet 7    Sig: TAKE 1/2 TABLET BY MOUTH DAILY     Endocrinology:  Minerals - Potassium Supplementation Failed - 11/20/2018 12:24 AM      Failed - K in normal range and within 360 days    Potassium  Date Value Ref Range Status  09/25/2018 3.0 (L) 3.5 - 5.1 mmol/L Final         Failed - Valid encounter within last 12 months    Recent Outpatient Visits          1 year ago Contusion of right ring finger without damage to nail, initial encounter   [facility], PA-C   1 year ago Viral URI with cough   [facility], PA-C   1 year ago History of sleep apnea   [facility], PA-C   2 years ago Acute bilateral low back pain without sciatica   [facility], Audrie Lia, PA-C   2 years ago Essential hypertension   [facility], MD             [place] in normal range and within 360 days    Creat  Date Value Ref Range Status  03/14/2014 0.89 0.50 - 1.10 mg/dL Final   Creatinine, Ser  Date Value Ref Range Status  09/25/2018 0.82 0.44 - 1.00 mg/dL Final

## 2018-11-22 ENCOUNTER — Telehealth: Payer: Self-pay | Admitting: *Deleted

## 2018-11-22 NOTE — Telephone Encounter (Signed)
Sent to scheduling pool.

## 2018-11-23 NOTE — Telephone Encounter (Signed)
Called pt 2x. "Call could not be completed as dialed"

## 2019-03-05 ENCOUNTER — Ambulatory Visit: Payer: Managed Care, Other (non HMO) | Admitting: Neurology

## 2019-03-06 ENCOUNTER — Encounter: Payer: Self-pay | Admitting: Neurology

## 2019-03-19 ENCOUNTER — Telehealth: Payer: Self-pay

## 2019-03-19 NOTE — Telephone Encounter (Signed)
Pt left a VM asking to reschedule her appt on 03/21/19. I called her back to reschedule. No answer, left a message asking her to call us back. If pt calls back please reschedule the appt with either Dr. Brett Fairy or an NP. The 03/21/19 appt has been cancelled. Pt can be reached at [phone].

## 2019-03-21 ENCOUNTER — Ambulatory Visit: Payer: Self-pay | Admitting: Neurology

## 2019-03-23 ENCOUNTER — Telehealth: Payer: Self-pay | Admitting: General Practice

## 2019-03-23 ENCOUNTER — Other Ambulatory Visit: Payer: Self-pay

## 2019-03-23 ENCOUNTER — Telehealth: Payer: PPO | Admitting: Family Medicine

## 2019-03-23 NOTE — Telephone Encounter (Signed)
Pt called canceled video apt today 3/5 because of work. Rescheduled for 3/9 and blood pressure medication is running out on 3/7 and was wondering if she can send in a curacy refill. chlorthalidone (HYGROTON) 25 MG tablet [id number]  Pease advise.

## 2019-03-23 NOTE — Telephone Encounter (Signed)
Pt was already give courtesy refill per note from pcp

## 2019-03-26 ENCOUNTER — Ambulatory Visit: Payer: PPO | Admitting: Adult Health

## 2019-03-26 ENCOUNTER — Telehealth: Payer: Self-pay | Admitting: Nurse Practitioner

## 2019-03-26 NOTE — Telephone Encounter (Signed)
This was done.

## 2019-03-26 NOTE — Telephone Encounter (Signed)
Pt has called to cancel appointment today, due to a lack of transportation

## 2019-03-27 ENCOUNTER — Other Ambulatory Visit: Payer: Self-pay

## 2019-03-27 ENCOUNTER — Ambulatory Visit (INDEPENDENT_AMBULATORY_CARE_PROVIDER_SITE_OTHER): Payer: PPO | Admitting: Family Medicine

## 2019-03-27 ENCOUNTER — Encounter: Payer: Self-pay | Admitting: Family Medicine

## 2019-03-27 VITALS — BP 130/68 | HR 74 | Temp 98.5°F | Ht 61.5 in | Wt 182.0 lb

## 2019-03-27 DIAGNOSIS — I10 Essential (primary) hypertension: Secondary | ICD-10-CM

## 2019-03-27 DIAGNOSIS — E876 Hypokalemia: Secondary | ICD-10-CM | POA: Diagnosis not present

## 2019-03-27 DIAGNOSIS — R7303 Prediabetes: Secondary | ICD-10-CM

## 2019-03-27 DIAGNOSIS — F419 Anxiety disorder, unspecified: Secondary | ICD-10-CM

## 2019-03-27 DIAGNOSIS — F32A Depression, unspecified: Secondary | ICD-10-CM

## 2019-03-27 DIAGNOSIS — F329 Major depressive disorder, single episode, unspecified: Secondary | ICD-10-CM | POA: Diagnosis not present

## 2019-03-27 DIAGNOSIS — G4733 Obstructive sleep apnea (adult) (pediatric): Secondary | ICD-10-CM | POA: Diagnosis not present

## 2019-03-27 MED ORDER — CHLORTHALIDONE 25 MG PO TABS: 25.0000 mg | ORAL_TABLET | Freq: Every day | ORAL | 1 refills | Status: DC

## 2019-03-27 MED ORDER — FLUOXETINE HCL 20 MG PO CAPS: 60.0000 mg | ORAL_CAPSULE | Freq: Every day | ORAL | 1 refills | Status: DC

## 2019-03-27 MED ORDER — POTASSIUM CHLORIDE CRYS ER 20 MEQ PO TBCR: EXTENDED_RELEASE_TABLET | ORAL | 1 refills | Status: DC

## 2019-03-27 NOTE — Patient Instructions (Signed)
° ° ° °  If you have lab work done today you will be contacted with your lab results within the next 2 weeks.  If you have not heard from us then please contact us. The fastest way to get your results is to register for My Chart. ° ° °IF you received an x-ray today, you will receive an invoice from [facility]. Please contact [facility] at [phone] with questions or concerns regarding your invoice.  ° °IF you received labwork today, you will receive an invoice from [facility]. Please contact [facility] at [phone] with questions or concerns regarding your invoice.  ° °Our billing staff will not be able to assist you with questions regarding bills from these companies. ° °You will be contacted with the lab results as soon as they are available. The fastest way to get your results is to activate your My Chart account. Instructions are located on the last page of this paperwork. If you have not heard from us regarding the results in 2 weeks, please contact this office. °  ° ° ° °

## 2019-03-27 NOTE — Progress Notes (Signed)
3/9/20212:02 PM  Traci Sermon 1955/03/05, 64 y.o., female [id number]  Chief Complaint  Patient presents with  . Hypertension    medication refills    HPI:   Patient is a 64 y.o. female with past medical history significant for HTN, hypokalemia, [name], prediabetes, depression who presents today for med refill  She has not been seen in about a year Takes 34mq of potassium a day with her chlorthalidone Ran out of KCL about a month ago Uses her cpap every night Not aware of h/o elevated a1c Reports that depression is well controlled on current dose of prozac 662m   Lab Results  Component Value Date   CREATININE 0.82 09/25/2018   BUN 14 09/25/2018   NA 141 09/25/2018   K 3.0 (L) 09/25/2018   CL 104 09/25/2018   CO2 27 09/25/2018    Depression screen PHQ 2/9 03/27/2019 04/23/2017 03/30/2017  Decreased Interest 0 0 0  Down, Depressed, Hopeless 0 0 0  PHQ - 2 Score 0 0 0    Fall Risk  03/27/2019 08/01/2017 04/23/2017 03/30/2017 12/24/2016  Falls in the past year? 0 No No No Yes  Number falls in past yr: 0 - - - 2 or more  Injury with Fall? 0 - - - No  Comment - - - - minor injuries, more frequently lately  Follow up Falls evaluation completed - - - -     No Known Allergies  Prior to Admission medications   Medication Sig Start Date End Date Taking? Authorizing Provider  aspirin 81 MG tablet You can restart your baby aspirin 1 daily 09/28/2018. 09/26/18  Yes JeEarnstine RegalPA-C  chlorthalidone (HYGROTON) 25 MG tablet Take 1 tablet (25 mg total) by mouth daily. 07/12/18  Yes StDelia Chimes, MD  FLUoxetine (PROZAC) 20 MG capsule Take 3 capsules (60 mg total) by mouth every morning. Patient taking differently: Take 60 mg by mouth daily.  07/12/18  Yes Stallings, Zoe A, MD  potassium chloride SA (KLOR-CON M20) 20 MEQ tablet TAKE 0.5 TABLETS (10 MEQ TOTAL) BY MOUTH DAILY. Patient taking differently: Take 10 mEq by mouth daily.  07/12/18  Yes StForrest MoronMD   acetaminophen (TYLENOL) 500 MG tablet You can take 1000 mg every 8 hours as needed for pain.  This is your primary pain relief medication.  You can alternate this with Aleve.  Do not take more than 4000 mg of Tylenol/acetaminophen per day, it can harm your liver. You can buy this over-the-counter at any drugstore. Patient not taking: Reported on 03/27/2019 09/26/18   JeEarnstine RegalPA-C  naproxen sodium (ALEVE) 220 MG tablet You can take 1 or 2 tablets every 12 hours with meals.  You can alternate this with the plain Tylenol/acetaminophen.  Follow package instructions for this medication.  You can buy this over-the-counter at any drugstore. Patient not taking: Reported on 03/27/2019 09/26/18   JeEarnstine RegalPA-C  oxyCODONE (OXY IR/ROXICODONE) 5 MG immediate release tablet Take 1 tablet (5 mg total) by mouth every 6 (six) hours as needed for moderate pain (Pain not relieved by Tylenol and Aleve). Patient not taking: Reported on 03/27/2019 09/26/18   JeEarnstine RegalPA-C    Past Medical History:  Diagnosis Date  . Depression   . Hypertension   . [name]     Past Surgical History:  Procedure Laterality Date  . APPENDECTOMY  09/26/2018  . JOINT REPLACEMENT    . LAPAROSCOPIC APPENDECTOMY N/A 09/26/2018  Procedure: APPENDECTOMY LAPAROSCOPIC;  Surgeon: Kinsinger, Arta Bruce, MD;  Location: [facility];  Service: General;  Laterality: N/A;  . SHOULDER SURGERY Left    01-02-2018, has had R shoulder rotator cuff previously    Social History   Tobacco Use  . Smoking status: Former Smoker    Packs/day: 3.00    Years: 20.00    Pack years: 60.00    Quit date: 2000    Years since quitting: 21.2  . Smokeless tobacco: Never Used  Substance Use Topics  . Alcohol use: No    Alcohol/week: 0.0 standard drinks    Family History  Problem Relation Age of Onset  . Hypertension Father   . Heart attack Father     Review of Systems  Constitutional: Negative for chills and fever.  Respiratory:  Negative for cough and shortness of breath.   Cardiovascular: Negative for chest pain, palpitations and leg swelling.  Gastrointestinal: Negative for abdominal pain, nausea and vomiting.  per hpi   OBJECTIVE:  Today's Vitals   03/27/19 1352 03/27/19 1358  BP: (!) 155/82 130/68  Pulse: 74   Temp: 98.5 F (36.9 C)   SpO2: 95%   Weight: 182 lb (82.6 kg)   Height: 5' 1.5" (1.562 m)    Body mass index is 33.83 kg/m.   Physical Exam Vitals and nursing note reviewed.  Constitutional:      Appearance: She is well-developed.  HENT:     Head: Normocephalic and atraumatic.     Mouth/Throat:     Pharynx: No oropharyngeal exudate.  Eyes:     General: No scleral icterus.    Conjunctiva/sclera: Conjunctivae normal.     Pupils: Pupils are equal, round, and reactive to light.  Cardiovascular:     Rate and Rhythm: Normal rate and regular rhythm.     Heart sounds: Normal heart sounds. No murmur. No friction rub. No gallop.   Pulmonary:     Effort: Pulmonary effort is normal.     Breath sounds: Normal breath sounds. No wheezing or rales.  Musculoskeletal:     Cervical back: Neck supple.  Skin:    General: Skin is warm and dry.  Neurological:     Mental Status: She is alert and oriented to person, place, and time.     No results found for this or any previous visit (from the past 24 hour(s)).  No results found.   ASSESSMENT and PLAN  1. Hypertension, unspecified type Controlled. Continue current regime.  - Lipid panel - CMP14+EGFR - chlorthalidone (HYGROTON) 25 MG tablet; Take 1 tablet (25 mg total) by mouth daily. - potassium chloride SA (KLOR-CON M20) 20 MEQ tablet; TAKE 0.5 TABLETS (10 MEQ TOTAL) BY MOUTH DAILY.  2. Hypokalemia Checking labs today, medications will be adjusted as needed.   3. Anxiety and depression Controlled. Continue current regime.  - TSH - FLUoxetine (PROZAC) 20 MG capsule; Take 3 capsules (60 mg total) by mouth daily.  4. Prediabetes -  Hemoglobin A1c  Return for Medical Wellness with me.    Rutherford Guys, MD [facility] [place], [place] [address] Ph.  [phone] Fax [phone]

## 2019-03-28 LAB — CMP14+EGFR
ALT: 12 IU/L (ref 0–32)
AST: 18 IU/L (ref 0–40)
Albumin/Globulin Ratio: 1.6 (ref 1.2–2.2)
Albumin: 4.4 g/dL (ref 3.8–4.8)
Alkaline Phosphatase: 112 IU/L (ref 39–117)
BUN/Creatinine Ratio: 22 (ref 12–28)
BUN: 17 mg/dL (ref 8–27)
Bilirubin Total: 0.4 mg/dL (ref 0.0–1.2)
CO2: 25 mmol/L (ref 20–29)
Calcium: 9.9 mg/dL (ref 8.7–10.3)
Chloride: 97 mmol/L (ref 96–106)
Creatinine, Ser: 0.77 mg/dL (ref 0.57–1.00)
GFR calc Af Amer: 94 mL/min/{1.73_m2} (ref >59)
GFR calc non Af Amer: 81 mL/min/{1.73_m2} (ref >59)
Globulin, Total: 2.7 g/dL (ref 1.5–4.5)
Glucose: 130 mg/dL — ABNORMAL HIGH (ref 65–99)
Potassium: 2.9 mmol/L — ABNORMAL LOW (ref 3.5–5.2)
Sodium: 139 mmol/L (ref 134–144)
Total Protein: 7.1 g/dL (ref 6.0–8.5)

## 2019-03-28 LAB — HEMOGLOBIN A1C
Est. average glucose Bld gHb Est-mCnc: 128 mg/dL
Hgb A1c MFr Bld: 6.1 % — ABNORMAL HIGH (ref 4.8–5.6)

## 2019-03-28 LAB — LIPID PANEL
Chol/HDL Ratio: 6.5 ratio — ABNORMAL HIGH (ref 0.0–4.4)
Cholesterol, Total: 285 mg/dL — ABNORMAL HIGH (ref 100–199)
HDL: 44 mg/dL (ref >39)
LDL Chol Calc (NIH): 165 mg/dL — ABNORMAL HIGH (ref 0–99)
Triglycerides: 392 mg/dL — ABNORMAL HIGH (ref 0–149)
VLDL Cholesterol Cal: 76 mg/dL — ABNORMAL HIGH (ref 5–40)

## 2019-03-28 LAB — TSH: TSH: 2.17 u[IU]/mL (ref 0.450–4.500)

## 2019-03-29 ENCOUNTER — Other Ambulatory Visit: Payer: Self-pay | Admitting: Family Medicine

## 2019-03-29 DIAGNOSIS — E876 Hypokalemia: Secondary | ICD-10-CM | POA: Insufficient documentation

## 2019-03-29 MED ORDER — POTASSIUM CHLORIDE CRYS ER 20 MEQ PO TBCR: 20.0000 meq | EXTENDED_RELEASE_TABLET | Freq: Two times a day (BID) | ORAL | 0 refills | Status: DC

## 2019-03-29 MED ORDER — ATORVASTATIN CALCIUM 20 MG PO TABS: 20.0000 mg | ORAL_TABLET | Freq: Every day | ORAL | 3 refills | Status: DC

## 2019-05-04 ENCOUNTER — Ambulatory Visit: Payer: PPO | Admitting: Family Medicine

## 2019-05-07 ENCOUNTER — Other Ambulatory Visit: Payer: Self-pay

## 2019-05-07 ENCOUNTER — Ambulatory Visit: Payer: PPO | Admitting: Adult Health

## 2019-05-07 ENCOUNTER — Encounter: Payer: Self-pay | Admitting: Adult Health

## 2019-05-07 VITALS — BP 148/72 | HR 70 | Temp 98.4°F | Ht 61.5 in | Wt 180.0 lb

## 2019-05-07 DIAGNOSIS — G4733 Obstructive sleep apnea (adult) (pediatric): Secondary | ICD-10-CM

## 2019-05-07 DIAGNOSIS — Z9989 Dependence on other enabling machines and devices: Secondary | ICD-10-CM

## 2019-05-07 NOTE — Patient Instructions (Signed)
Continue using CPAP nightly and greater than 4 hours each night °If your symptoms worsen or you develop new symptoms please let us know.  ° °

## 2019-05-07 NOTE — Progress Notes (Signed)
PATIENT: April Stevens DOB: 09-21-1955  REASON FOR VISIT: follow up HISTORY FROM: patient  HISTORY OF PRESENT ILLNESS: Today 05/07/19: April Stevens is a 64 year old female with a history of obstructive sleep apnea on CPAP.  Her download indicates that she use her machine nightly for compliance of 100%.  She used her machine greater than 4 hours 26 days for compliance of 87%.  On average she uses her machine 5 hours and 56 minutes.  Her residual AHI is 4.1 on 5-15 cm of water with EPR 3.  Leak in the 95th percentile is 13.8 L/min.  She continues to work 2 jobs.  Reports that the CPAP continues to work well for her.  HISTORY 2/12/2020CM April Stevens 64 year old female returns for follow-up with history of obstructive sleep apnea here for CPAP compliance she has done well since last seen.  Compliance data dated 01/29/2018-02/27/2018 shows compliance greater than 4 hours at 93%.  Average usage 6 hours 46 minutes.  Set pressure 5 to 15 cm.  EPR level 3 leaks 95th percentile 49.9 AHI 4.3.  Patient is currently on leave of absence from  work since shoulder surgery she returns for reevaluation  REVIEW OF SYSTEMS: Out of a complete 14 system review of symptoms, the patient complains only of the following symptoms, and all other reviewed systems are negative.  FSS 37 ESS11  ALLERGIES: No Known Allergies  HOME MEDICATIONS: Outpatient Medications Prior to Visit  Medication Sig Dispense Refill  . acetaminophen (TYLENOL) 500 MG tablet You can take 1000 mg every 8 hours as needed for pain.  This is your primary pain relief medication.  You can alternate this with Aleve.  Do not take more than 4000 mg of Tylenol/acetaminophen per day, it can harm your liver. You can buy this over-the-counter at any drugstore. (Patient not taking: Reported on 03/27/2019) 30 tablet 0  . aspirin 81 MG tablet You can restart your baby aspirin 1 daily 09/28/2018. 365 tablet 0  . atorvastatin (LIPITOR) 20 MG tablet Take 1 tablet (20 mg  total) by mouth daily. 90 tablet 3  . chlorthalidone (HYGROTON) 25 MG tablet Take 1 tablet (25 mg total) by mouth daily. 90 tablet 1  . FLUoxetine (PROZAC) 20 MG capsule Take 3 capsules (60 mg total) by mouth daily. 270 capsule 1  . potassium chloride SA (KLOR-CON M20) 20 MEQ tablet TAKE 0.5 TABLETS (10 MEQ TOTAL) BY MOUTH DAILY. 90 tablet 1  . potassium chloride SA (KLOR-CON) 20 MEQ tablet Take 1 tablet (20 mEq total) by mouth 2 (two) times daily for 5 days. 10 tablet 0   No facility-administered medications prior to visit.    PAST MEDICAL HISTORY: Past Medical History:  Diagnosis Date  . Depression   . Hypertension   . [name]     PAST SURGICAL HISTORY: Past Surgical History:  Procedure Laterality Date  . APPENDECTOMY  09/26/2018  . HYSTERECTOMY ABDOMINAL WITH SALPINGO-OOPHORECTOMY  2000   DUB  . JOINT REPLACEMENT    . LAPAROSCOPIC APPENDECTOMY N/A 09/26/2018   Procedure: APPENDECTOMY LAPAROSCOPIC;  Surgeon: Kinsinger, Arta Bruce, MD;  Location: [facility];  Service: General;  Laterality: N/A;  . SHOULDER SURGERY Left    01-02-2018, has had R shoulder rotator cuff previously    FAMILY HISTORY: Family History  Problem Relation Age of Onset  . Hypertension Father   . Heart attack Father     SOCIAL HISTORY: Social History   Socioeconomic History  . Marital status: Single  Spouse name: Not on file  . Number of children: Not on file  . Years of education: Not on file  . Highest education level: Not on file  Occupational History  . Not on file  Tobacco Use  . Smoking status: Former Smoker    Packs/day: 3.00    Years: 20.00    Pack years: 60.00    Quit date: 2000    Years since quitting: 21.3  . Smokeless tobacco: Never Used  Substance and Sexual Activity  . Alcohol use: No    Alcohol/week: 0.0 standard drinks  . Drug use: No  . Sexual activity: Yes  Other Topics Concern  . Not on file  Social History Narrative  . Not on file   Social Determinants of Health    Financial Resource Strain:   . Difficulty of Paying Living Expenses:   Food Insecurity:   . Worried About Charity fundraiser in the Last Year:   . Arboriculturist in the Last Year:   Transportation Needs:   . Film/video editor (Medical):   [name] Lack of Transportation (Non-Medical):   Physical Activity:   . Days of Exercise per Week:   . Minutes of Exercise per Session:   Stress:   . Feeling of Stress :   Social Connections:   . Frequency of Communication with Friends and Family:   . Frequency of Social Gatherings with Friends and Family:   . Attends Religious Services:   . Active Member of Clubs or Organizations:   . Attends Archivist Meetings:   [name] Marital Status:   Intimate Partner Violence:   . Fear of Current or Ex-Partner:   . Emotionally Abused:   [name] Physically Abused:   . Sexually Abused:       PHYSICAL EXAM  Vitals:   05/07/19 0912  BP: (!) 148/72  Pulse: 70  Temp: 98.4 F (36.9 C)  Weight: 180 lb (81.6 kg)  Height: 5' 1.5" (1.562 m)   Body mass index is 33.46 kg/m.  Generalized: Well developed, in no acute distress  Chest: Lungs clear to auscultation bilaterally  Neurological examination  Mentation: Alert oriented to time, place, history taking. Follows all commands speech and language fluent Cranial nerve II-XII: Extraocular movements were full, visual field were full on confrontational test Head turning and shoulder shrug  were normal and symmetric. Motor: The motor testing reveals 5 over 5 strength of all 4 extremities. Good symmetric motor tone is noted throughout.  Sensory: Sensory testing is intact to soft touch on all 4 extremities. No evidence of extinction is noted.  Gait and station: Gait is normal.    DIAGNOSTIC DATA (LABS, IMAGING, TESTING) - I reviewed patient records, labs, notes, testing and imaging myself where available.  Lab Results  Component Value Date   WBC 9.9 09/25/2018   HGB 13.7 09/25/2018   HCT 39.5  09/25/2018   MCV 86.8 09/25/2018   PLT 352 09/25/2018      Component Value Date/Time   NA 139 03/27/2019 1443   K 2.9 (L) 03/27/2019 1443   CL 97 03/27/2019 1443   CO2 25 03/27/2019 1443   GLUCOSE 130 (H) 03/27/2019 1443   GLUCOSE 143 (H) 09/25/2018 2258   BUN 17 03/27/2019 1443   CREATININE 0.77 03/27/2019 1443   CREATININE 0.89 03/14/2014 1146   CALCIUM 9.9 03/27/2019 1443   PROT 7.1 03/27/2019 1443   ALBUMIN 4.4 03/27/2019 1443   AST 18 03/27/2019 1443   ALT  12 03/27/2019 1443   ALKPHOS 112 03/27/2019 1443   BILITOT 0.4 03/27/2019 1443   GFRNONAA 81 03/27/2019 1443   GFRNONAA 71 03/14/2014 1146   GFRAA 94 03/27/2019 1443   GFRAA 81 03/14/2014 1146   Lab Results  Component Value Date   CHOL 285 (H) 03/27/2019   HDL 44 03/27/2019   LDLCALC 165 (H) 03/27/2019   TRIG 392 (H) 03/27/2019   CHOLHDL 6.5 (H) 03/27/2019   Lab Results  Component Value Date   HGBA1C 6.1 (H) 03/27/2019   No results found for: [id number] Lab Results  Component Value Date   TSH 2.170 03/27/2019      ASSESSMENT AND PLAN 64 y.o. year old female  has a past medical history of Depression, Hypertension, and [name]. here with:  1. [name]  - CPAP compliance excellent - Good treatment of AHI  - Encourage patient to use CPAP nightly and > 4 hours each night - F/U in 1 year or sooner if needed   I spent 20 minutes of face-to-face and non-face-to-face time with patient.  This included previsit chart review, lab review, study review, order entry, electronic health record documentation, patient education.  [name], MSN, NP-C 05/07/2019, 9:09 AM [facility] [address] [place], [place] [address] [phone]

## 2019-05-18 ENCOUNTER — Ambulatory Visit: Payer: PPO | Admitting: Family Medicine

## 2019-06-29 ENCOUNTER — Encounter: Payer: Self-pay | Admitting: Family Medicine

## 2019-06-29 ENCOUNTER — Ambulatory Visit (INDEPENDENT_AMBULATORY_CARE_PROVIDER_SITE_OTHER): Payer: PPO | Admitting: Family Medicine

## 2019-06-29 ENCOUNTER — Other Ambulatory Visit: Payer: Self-pay

## 2019-06-29 VITALS — BP 135/74 | HR 79 | Temp 98.7°F | Ht 61.5 in | Wt 184.8 lb

## 2019-06-29 DIAGNOSIS — Z1211 Encounter for screening for malignant neoplasm of colon: Secondary | ICD-10-CM | POA: Diagnosis not present

## 2019-06-29 DIAGNOSIS — F329 Major depressive disorder, single episode, unspecified: Secondary | ICD-10-CM | POA: Diagnosis not present

## 2019-06-29 DIAGNOSIS — Z780 Asymptomatic menopausal state: Secondary | ICD-10-CM

## 2019-06-29 DIAGNOSIS — Z0001 Encounter for general adult medical examination with abnormal findings: Secondary | ICD-10-CM

## 2019-06-29 DIAGNOSIS — E876 Hypokalemia: Secondary | ICD-10-CM

## 2019-06-29 DIAGNOSIS — F419 Anxiety disorder, unspecified: Secondary | ICD-10-CM

## 2019-06-29 DIAGNOSIS — E7800 Pure hypercholesterolemia, unspecified: Secondary | ICD-10-CM

## 2019-06-29 DIAGNOSIS — I10 Essential (primary) hypertension: Secondary | ICD-10-CM

## 2019-06-29 DIAGNOSIS — Z0000 Encounter for general adult medical examination without abnormal findings: Secondary | ICD-10-CM

## 2019-06-29 DIAGNOSIS — F32A Depression, unspecified: Secondary | ICD-10-CM

## 2019-06-29 LAB — LIPID PANEL
Chol/HDL Ratio: 4.6 ratio — ABNORMAL HIGH (ref 0.0–4.4)
Cholesterol, Total: 205 mg/dL — ABNORMAL HIGH (ref 100–199)
HDL: 45 mg/dL (ref >39)
LDL Chol Calc (NIH): 110 mg/dL — ABNORMAL HIGH (ref 0–99)
Triglycerides: 292 mg/dL — ABNORMAL HIGH (ref 0–149)
VLDL Cholesterol Cal: 50 mg/dL — ABNORMAL HIGH (ref 5–40)

## 2019-06-29 LAB — COMPREHENSIVE METABOLIC PANEL
ALT: 17 IU/L (ref 0–32)
AST: 22 IU/L (ref 0–40)
Albumin/Globulin Ratio: 1.5 (ref 1.2–2.2)
Albumin: 4.3 g/dL (ref 3.8–4.8)
Alkaline Phosphatase: 109 IU/L (ref 48–121)
BUN/Creatinine Ratio: 13 (ref 12–28)
BUN: 12 mg/dL (ref 8–27)
Bilirubin Total: 0.7 mg/dL (ref 0.0–1.2)
CO2: 23 mmol/L (ref 20–29)
Calcium: 9.1 mg/dL (ref 8.7–10.3)
Chloride: 99 mmol/L (ref 96–106)
Creatinine, Ser: 0.9 mg/dL (ref 0.57–1.00)
GFR calc Af Amer: 78 mL/min/{1.73_m2} (ref >59)
GFR calc non Af Amer: 67 mL/min/{1.73_m2} (ref >59)
Globulin, Total: 2.9 g/dL (ref 1.5–4.5)
Glucose: 147 mg/dL — ABNORMAL HIGH (ref 65–99)
Potassium: 3 mmol/L — ABNORMAL LOW (ref 3.5–5.2)
Sodium: 139 mmol/L (ref 134–144)
Total Protein: 7.2 g/dL (ref 6.0–8.5)

## 2019-06-29 MED ORDER — CHLORTHALIDONE 25 MG PO TABS: 25.0000 mg | ORAL_TABLET | Freq: Every day | ORAL | 1 refills | Status: DC

## 2019-06-29 MED ORDER — FLUOXETINE HCL 20 MG PO CAPS: 60.0000 mg | ORAL_CAPSULE | Freq: Every day | ORAL | 1 refills | Status: DC

## 2019-06-29 MED ORDER — ATORVASTATIN CALCIUM 20 MG PO TABS: 20.0000 mg | ORAL_TABLET | Freq: Every day | ORAL | 3 refills | Status: DC

## 2019-06-29 MED ORDER — POTASSIUM CHLORIDE CRYS ER 20 MEQ PO TBCR: EXTENDED_RELEASE_TABLET | ORAL | 1 refills | Status: DC

## 2019-06-29 NOTE — Progress Notes (Signed)
Presents today for April Stevens.   Date of last exam: n/a  Interpreter used for this visit? n/a  Patient Care Team: Rutherford Guys, MD as PCP - General (Family Medicine)     Other items to address today:  PMH: HTN, hypokalemia, [name], prediabetes, depression   Cancer Screening: Cervical: n/a hysterectomy Breast: last mammo about 5 years ago, very painful, declines further mammograms at this time Colon: colonoscopy at age 64 normal, no fhx   Other Screening: Last screening for diabetes: prediabetes Lab Results  Component Value Date   HGBA1C 6.1 (H) 03/27/2019   Last lipid screening: HLP, started atorvastatin in march, tolerating well  Lab Results  Component Value Date   CHOL 285 (H) 03/27/2019   HDL 44 03/27/2019   LDLCALC 165 (H) 03/27/2019   TRIG 392 (H) 03/27/2019   CHOLHDL 6.5 (H) 03/27/2019     ADVANCE DIRECTIVES: Discussed: yes On File: no Materials Provided: yes   Immunization status:  Immunization History  Administered Date(s) Administered  . Tdap 02/23/2012     Health Maintenance Due  Topic Date Due  . MAMMOGRAM  Never done  . COLONOSCOPY  Never done  . DEXA SCAN  Never done     Functional Status Survey: Is the patient deaf or have difficulty hearing?: No Does the patient have difficulty seeing, even when wearing glasses/contacts?: Yes Does the patient have difficulty concentrating, remembering, or making decisions?: No Does the patient have difficulty walking or climbing stairs?: No Does the patient have difficulty dressing or bathing?: No Does the patient have difficulty doing errands alone such as visiting a doctor's office or shopping?: No   6CIT Screen 06/29/2019  What Year? 0 points  What month? 0 points  What time? 0 points  Count back from 20 0 points  Months in reverse 0 points  Repeat phrase 0 points  Total Score 0     Home Environment: safe  Urinary Incontinence Screening:  denies UI  Patient Active Problem List   Diagnosis Date Noted  . Hypokalemia 03/29/2019  . Acute appendicitis 09/26/2018  . Obstructive sleep apnea treated with continuous positive airway pressure (CPAP) 08/01/2017  . Inadequate sleep hygiene 02/22/2017  . Excessive daytime sleepiness 02/22/2017  . Sleep deprivation 02/22/2017  . Snoring 02/22/2017  . OSA (obstructive sleep apnea) 02/22/2017  . HTN (hypertension) 02/23/2012  . Depression 02/23/2012     Past Medical History:  Diagnosis Date  . Depression   . Hypertension   . [name]      Past Surgical History:  Procedure Laterality Date  . APPENDECTOMY  09/26/2018  . HYSTERECTOMY ABDOMINAL WITH SALPINGO-OOPHORECTOMY  2000   DUB  . JOINT REPLACEMENT    . LAPAROSCOPIC APPENDECTOMY N/A 09/26/2018   Procedure: APPENDECTOMY LAPAROSCOPIC;  Surgeon: Kinsinger, Arta Bruce, MD;  Location: [facility];  Service: General;  Laterality: N/A;  . SHOULDER SURGERY Left    01-02-2018, has had R shoulder rotator cuff previously     Family History  Problem Relation Age of Onset  . Hypertension Father   . Heart attack Father      Social History   Socioeconomic History  . Marital status: Single    Spouse name: Not on file  . Number of children: Not on file  . Years of education: Not on file  . Highest education level: Not on file  Occupational History  . Not on file  Tobacco Use  . Smoking status: Former Smoker  Packs/day: 3.00    Years: 20.00    Pack years: 60.00    Quit date: 2000    Years since quitting: 21.4  . Smokeless tobacco: Never Used  Vaping Use  . Vaping Use: Never used  Substance and Sexual Activity  . Alcohol use: No    Alcohol/week: 0.0 standard drinks  . Drug use: No  . Sexual activity: Yes  Other Topics Concern  . Not on file  Social History Narrative  . Not on file   Social Determinants of Health   Financial Resource Strain:   . Difficulty of Paying Living Expenses:   Food Insecurity:   .  Worried About Charity fundraiser in the Last Year:   . Arboriculturist in the Last Year:   Transportation Needs:   . Film/video editor (Medical):   [name] Lack of Transportation (Non-Medical):   Physical Activity:   . Days of Exercise per Week:   . Minutes of Exercise per Session:   Stress:   . Feeling of Stress :   Social Connections:   . Frequency of Communication with Friends and Family:   . Frequency of Social Gatherings with Friends and Family:   . Attends Religious Services:   . Active Member of Clubs or Organizations:   . Attends Archivist Meetings:   [name] Marital Status:   Intimate Partner Violence:   . Fear of Current or Ex-Partner:   . Emotionally Abused:   [name] Physically Abused:   . Sexually Abused:      No Known Allergies   Prior to Admission medications   Medication Sig Start Date End Date Taking? Authorizing Provider  aspirin 81 MG tablet You can restart your baby aspirin 1 daily 09/28/2018. 09/26/18   Earnstine Regal, PA-C  atorvastatin (LIPITOR) 20 MG tablet Take 1 tablet (20 mg total) by mouth daily. 03/29/19   Rutherford Guys, MD  chlorthalidone (HYGROTON) 25 MG tablet Take 1 tablet (25 mg total) by mouth daily. 03/27/19   Rutherford Guys, MD  FLUoxetine (PROZAC) 20 MG capsule Take 3 capsules (60 mg total) by mouth daily. 03/27/19   Rutherford Guys, MD  potassium chloride SA (KLOR-CON M20) 20 MEQ tablet TAKE 0.5 TABLETS (10 MEQ TOTAL) BY MOUTH DAILY. 03/27/19   Rutherford Guys, MD     Depression screen [facility] 2/9 06/29/2019 03/27/2019 04/23/2017 03/30/2017 12/24/2016  Decreased Interest 0 0 0 0 0  Down, Depressed, Hopeless 0 0 0 0 0  PHQ - 2 Score 0 0 0 0 0     Fall Risk  03/27/2019 08/01/2017 04/23/2017 03/30/2017 12/24/2016  Falls in the past year? 0 No No No Yes  Number falls in past yr: 0 - - - 2 or more  Injury with Fall? 0 - - - No  Comment - - - - minor injuries, more frequently lately  Follow up Falls evaluation completed - - - -     PHYSICAL  EXAM: BP 135/74   Pulse 79   Temp 98.7 F (37.1 C)   Ht 5' 1.5" (1.562 m)   Wt 184 lb 12.8 oz (83.8 kg)   SpO2 96%   BMI 34.35 kg/m   Wt Readings from Last 3 Encounters:  05/07/19 180 lb (81.6 kg)  03/27/19 182 lb (82.6 kg)  03/01/18 193 lb (87.5 kg)     Hearing Screening   125Hz  250Hz  500Hz  1000Hz  2000Hz  3000Hz  4000Hz  6000Hz  8000Hz   Right ear:  Left ear:             Visual Acuity Screening   Right eye Left eye Both eyes  Without correction:     With correction: 20/25 20/20 20/15     Physical Exam Vitals and nursing note reviewed. Exam conducted with a chaperone present.  HENT:     Head: Normocephalic and atraumatic.     Right Ear: Hearing, tympanic membrane, ear canal and external ear normal.     Left Ear: Hearing, tympanic membrane, ear canal and external ear normal.  Eyes:     Pupils: Pupils are equal, round, and reactive to light.  Neck:     Thyroid: No thyromegaly.  Cardiovascular:     Rate and Rhythm: Normal rate and regular rhythm.     Heart sounds: Normal heart sounds. No murmur heard.  No friction rub. No gallop.   Pulmonary:     Effort: Pulmonary effort is normal.     Breath sounds: Normal breath sounds. No wheezing or rales.  Chest:     Breasts: Breasts are symmetrical.        Right: No inverted nipple, mass, nipple discharge, skin change or tenderness.        Left: No inverted nipple, mass, nipple discharge, skin change or tenderness.  Abdominal:     General: Bowel sounds are normal. There is no distension.     Palpations: Abdomen is soft. There is no mass.     Tenderness: There is no abdominal tenderness.  Musculoskeletal:        General: Normal range of motion.     Cervical back: Neck supple.  Lymphadenopathy:     Cervical: No cervical adenopathy.     Upper Body:     Right upper body: No supraclavicular adenopathy.     Left upper body: No supraclavicular adenopathy.  Skin:    General: Skin is warm and dry.  Neurological:     Mental  Status: She is alert and oriented to person, place, and time.     Gait: Gait is intact.     Deep Tendon Reflexes: Reflexes are normal and symmetric.  Psychiatric:        Mood and Affect: Mood and affect normal.     ASSESSMENT/PLAN:  1. Encounter for Medicare annual wellness exam Education/Counseling provided regarding diet and exercise, prevention of chronic diseases, smoking/tobacco cessation if appropriate, and reviewed "Covered Medicare Preventive Services"  2. Hypertension, unspecified type Controlled. Continue current regime.  - chlorthalidone (HYGROTON) 25 MG tablet; Take 1 tablet (25 mg total) by mouth daily. - potassium chloride SA (KLOR-CON M20) 20 MEQ tablet; TAKE 0.5 TABLETS (10 MEQ TOTAL) BY MOUTH DAILY.  3. Anxiety and depression stable. Continue current regime.  - FLUoxetine (PROZAC) 20 MG capsule; Take 3 capsules (60 mg total) by mouth daily.  4. Hypokalemia Checking labs today, medications will be adjusted as needed.  - Comprehensive metabolic panel  5. Pure hypercholesterolemia Checking labs today, medications will be adjusted as needed.  - Lipid panel - atorvastatin (LIPITOR) 20 MG tablet; Take 1 tablet (20 mg total) by mouth daily  6. Colon cancer screening - Cologuard  7. Postmenopausal estrogen deficiency - DG Bone Density; Future  Return in about 3 months (around 09/29/2019).

## 2019-06-29 NOTE — Patient Instructions (Addendum)
We recommend that you schedule a mammogram for breast cancer screening. Typically, you do not need a referral to do this. Please contact a local imaging center to schedule your mammogram.  [facility] ([place]) - [phone] or [phone]     Preventive Care 65 Years and Older, Female Preventive care refers to lifestyle choices and visits with your health care provider that can promote health and wellness. This includes:  A yearly physical exam. This is also called an annual well check.  Regular dental and eye exams.  Immunizations.  Screening for certain conditions.  Healthy lifestyle choices, such as diet and exercise. What can I expect for my preventive care visit? Physical exam Your health care provider will check:  Height and weight. These may be used to calculate body mass index (BMI), which is a measurement that tells if you are at a healthy weight.  Heart rate and blood pressure.  Your skin for abnormal spots. Counseling Your health care provider may ask you questions about:  Alcohol, tobacco, and drug use.  Emotional well-being.  Home and relationship well-being.  Sexual activity.  Eating habits.  History of falls.  Memory and ability to understand (cognition).  Work and work Statistician.  Pregnancy and menstrual history. What immunizations do I need?  Influenza (flu) vaccine  This is recommended every year. Tetanus, diphtheria, and pertussis (Tdap) vaccine  You may need a Td booster every 10 years. Varicella (chickenpox) vaccine  You may need this vaccine if you have not already been vaccinated. Zoster (shingles) vaccine  You may need this after age 71. Pneumococcal conjugate (PCV13) vaccine  One dose is recommended after age 43. Pneumococcal polysaccharide (PPSV23) vaccine  One dose is recommended after age 58. Measles, mumps, and rubella (MMR) vaccine  You may need at least one dose of MMR if you were born in  1958 or later. You may also need a second dose. Meningococcal conjugate (MenACWY) vaccine  You may need this if you have certain conditions. Hepatitis A vaccine  You may need this if you have certain conditions or if you travel or work in places where you may be exposed to hepatitis A. Hepatitis B vaccine  You may need this if you have certain conditions or if you travel or work in places where you may be exposed to hepatitis B. Haemophilus influenzae type b (Hib) vaccine  You may need this if you have certain conditions. You may receive vaccines as individual doses or as more than one vaccine together in one shot (combination vaccines). Talk with your health care provider about the risks and benefits of combination vaccines. What tests do I need? Blood tests  Lipid and cholesterol levels. These may be checked every 5 years, or more frequently depending on your overall health.  Hepatitis C test.  Hepatitis B test. Screening  Lung cancer screening. You may have this screening every year starting at age 10 if you have a 30-pack-year history of smoking and currently smoke or have quit within the past 15 years.  Colorectal cancer screening. All adults should have this screening starting at age 28 and continuing until age 23. Your health care provider may recommend screening at age 67 if you are at increased risk. You will have tests every 1-10 years, depending on your results and the type of screening test.  Diabetes screening. This is done by checking your blood sugar (glucose) after you have not eaten for a while (fasting). You may have this done  every 1-3 years.  Mammogram. This may be done every 1-2 years. Talk with your health care provider about how often you should have regular mammograms.  BRCA-related cancer screening. This may be done if you have a family history of breast, ovarian, tubal, or peritoneal cancers. Other tests  Sexually transmitted disease (STD) testing.  Bone  density scan. This is done to screen for osteoporosis. You may have this done starting at age 59. Follow these instructions at home: Eating and drinking  Eat a diet that includes fresh fruits and vegetables, whole grains, lean protein, and low-fat dairy products. Limit your intake of foods with high amounts of sugar, saturated fats, and salt.  Take vitamin and mineral supplements as recommended by your health care provider.  Do not drink alcohol if your health care provider tells you not to drink.  If you drink alcohol: ? Limit how much you have to 0-1 drink a day. ? Be aware of how much alcohol is in your drink. In the [place] one drink equals one 12 oz bottle of beer (355 mL), one 5 oz glass of wine (148 mL), or one 1 oz glass of hard liquor (44 mL). Lifestyle  Take daily care of your teeth and gums.  Stay active. Exercise for at least 30 minutes on 5 or more days each week.  Do not use any products that contain nicotine or tobacco, such as cigarettes, e-cigarettes, and chewing tobacco. If you need help quitting, ask your health care provider.  If you are sexually active, practice safe sex. Use a condom or other form of protection in order to prevent STIs (sexually transmitted infections).  Talk with your health care provider about taking a low-dose aspirin or statin. What's next?  Go to your health care provider once a year for a well check visit.  Ask your health care provider how often you should have your eyes and teeth checked.  Stay up to date on all vaccines. This information is not intended to replace advice given to you by your health care provider. Make sure you discuss any questions you have with your health care provider. Document Revised: 12/29/2017 Document Reviewed: 12/29/2017 Elsevier Patient Education  [employer].    If you have lab work done today you will be contacted with your lab results within the next 2 weeks.  If you have not heard from [place] then  please contact us. The fastest way to get your results is to register for My Chart.   IF you received an x-ray today, you will receive an invoice from [facility]. Please contact [facility] at [phone] with questions or concerns regarding your invoice.   IF you received labwork today, you will receive an invoice from [facility]. Please contact [facility] at [phone] with questions or concerns regarding your invoice.   Our billing staff will not be able to assist you with questions regarding bills from these companies.  You will be contacted with the lab results as soon as they are available. The fastest way to get your results is to activate your My Chart account. Instructions are located on the last page of this paperwork. If you have not heard from [place] regarding the results in 2 weeks, please contact this office.

## 2019-07-05 ENCOUNTER — Other Ambulatory Visit: Payer: Self-pay | Admitting: Family Medicine

## 2019-07-05 DIAGNOSIS — E876 Hypokalemia: Secondary | ICD-10-CM

## 2019-07-05 DIAGNOSIS — I10 Essential (primary) hypertension: Secondary | ICD-10-CM

## 2019-07-05 MED ORDER — POTASSIUM CHLORIDE CRYS ER 20 MEQ PO TBCR: 20.0000 meq | EXTENDED_RELEASE_TABLET | Freq: Every day | ORAL | 0 refills | Status: DC

## 2019-09-12 DIAGNOSIS — M25562 Pain in left knee: Secondary | ICD-10-CM | POA: Diagnosis not present

## 2019-09-12 DIAGNOSIS — M7541 Impingement syndrome of right shoulder: Secondary | ICD-10-CM | POA: Diagnosis not present

## 2019-09-28 ENCOUNTER — Ambulatory Visit: Payer: PPO | Admitting: Family Medicine

## 2019-10-02 ENCOUNTER — Encounter: Payer: Self-pay | Admitting: Family Medicine

## 2019-10-02 ENCOUNTER — Telehealth (INDEPENDENT_AMBULATORY_CARE_PROVIDER_SITE_OTHER): Payer: PPO | Admitting: Family Medicine

## 2019-10-02 ENCOUNTER — Other Ambulatory Visit: Payer: Self-pay

## 2019-10-02 DIAGNOSIS — E876 Hypokalemia: Secondary | ICD-10-CM | POA: Diagnosis not present

## 2019-10-02 DIAGNOSIS — R7303 Prediabetes: Secondary | ICD-10-CM | POA: Diagnosis not present

## 2019-10-02 DIAGNOSIS — F329 Major depressive disorder, single episode, unspecified: Secondary | ICD-10-CM | POA: Diagnosis not present

## 2019-10-02 DIAGNOSIS — I10 Essential (primary) hypertension: Secondary | ICD-10-CM

## 2019-10-02 DIAGNOSIS — F32A Depression, unspecified: Secondary | ICD-10-CM

## 2019-10-02 DIAGNOSIS — E7800 Pure hypercholesterolemia, unspecified: Secondary | ICD-10-CM

## 2019-10-02 DIAGNOSIS — F419 Anxiety disorder, unspecified: Secondary | ICD-10-CM | POA: Diagnosis not present

## 2019-10-02 MED ORDER — ATORVASTATIN CALCIUM 20 MG PO TABS: 20.0000 mg | ORAL_TABLET | Freq: Every day | ORAL | 1 refills | Status: DC

## 2019-10-02 MED ORDER — POTASSIUM CHLORIDE CRYS ER 20 MEQ PO TBCR: 20.0000 meq | EXTENDED_RELEASE_TABLET | Freq: Every day | ORAL | 1 refills | Status: DC

## 2019-10-02 MED ORDER — FLUOXETINE HCL 20 MG PO CAPS: 60.0000 mg | ORAL_CAPSULE | Freq: Every day | ORAL | 1 refills | Status: AC

## 2019-10-02 MED ORDER — CHLORTHALIDONE 25 MG PO TABS: 25.0000 mg | ORAL_TABLET | Freq: Every day | ORAL | 1 refills | Status: DC

## 2019-10-02 NOTE — Progress Notes (Signed)
Virtual Visit Note  I connected with patient on 10/02/19 at 411pm by phone (per patient preference) and verified that I am speaking with the correct person using two identifiers. April Stevens is currently located at home and patient is currently with them during visit. The provider, Rutherford Guys, MD is located in their office at time of visit.  I discussed the limitations, risks, security and privacy concerns of performing an evaluation and management service by telephone and the availability of in person appointments. I also discussed with the patient that there may be a patient responsible charge related to this service. The patient expressed understanding and agreed to proceed.   I provided 9 minutes of non-face-to-face time during this encounter.  Chief Complaint  Patient presents with  . Hypertension    pt monitoring at home reports normal BP and no physical symtpms  . Anxiety    pt has had some anxiety recently but thinks she would like to ween from the prozac   . Hyperlipidemia    pt has been working on diet to try and improve this reports she has been taking her atorvastatin     HPI  Last OV June 2021 - increased potassium to 13meq?  PMH: HTN, hypokalemia, [name], prediabetes, depression/anxiety  She is overall doing well She has retired from one job, she does have another job, less stressful She walks a lot, she has joined [employer] sne[employer] Feels that her depression/anxiety are well controlled on prozac 60mg   She is taking atorvastatin 20mg  daily for HLP She takes chlorthalidone 25mg  for BP and KCL 36meq for hypokalemia She continues to monitor diet She does not check BP nor cbgs at home  BP Readings from Last 3 Encounters:  06/29/19 135/74  05/07/19 (!) 148/72  03/27/19 130/68   Lab Results  Component Value Date   CHOL 205 (H) 06/29/2019   HDL 45 06/29/2019   LDLCALC 110 (H) 06/29/2019   TRIG 292 (H) 06/29/2019   CHOLHDL 4.6 (H) 06/29/2019   Lab  Results  Component Value Date   CREATININE 0.90 06/29/2019   BUN 12 06/29/2019   NA 139 06/29/2019   K 3.0 (L) 06/29/2019   CL 99 06/29/2019   CO2 23 06/29/2019   Lab Results  Component Value Date   HGBA1C 6.1 (H) 03/27/2019    No Known Allergies  Prior to Admission medications   Medication Sig Start Date End Date Taking? Authorizing Provider  aspirin 81 MG tablet You can restart your baby aspirin 1 daily 09/28/2018. 09/26/18   Earnstine Regal, PA-C  atorvastatin (LIPITOR) 20 MG tablet Take 1 tablet (20 mg total) by mouth daily. 06/29/19   Rutherford Guys, MD  chlorthalidone (HYGROTON) 25 MG tablet Take 1 tablet (25 mg total) by mouth daily. 06/29/19   Rutherford Guys, MD  FLUoxetine (PROZAC) 20 MG capsule Take 3 capsules (60 mg total) by mouth daily. 06/29/19   Rutherford Guys, MD  potassium chloride SA (KLOR-CON M20) 20 MEQ tablet Take 1 tablet (20 mEq total) by mouth daily. TAKE 0.5 TABLETS (10 MEQ TOTAL) BY MOUTH DAILY. 07/05/19   Rutherford Guys, MD    Past Medical History:  Diagnosis Date  . Depression   . Hypertension   . [name]     Past Surgical History:  Procedure Laterality Date  . APPENDECTOMY  09/26/2018  . HYSTERECTOMY ABDOMINAL WITH SALPINGO-OOPHORECTOMY  2000   DUB  . JOINT REPLACEMENT    .  LAPAROSCOPIC APPENDECTOMY N/A 09/26/2018   Procedure: APPENDECTOMY LAPAROSCOPIC;  Surgeon: Kinsinger, Arta Bruce, MD;  Location: [facility];  Service: General;  Laterality: N/A;  . SHOULDER SURGERY Left    01-02-2018, has had R shoulder rotator cuff previously    Social History   Tobacco Use  . Smoking status: Former Smoker    Packs/day: 3.00    Years: 20.00    Pack years: 60.00    Quit date: 2000    Years since quitting: 21.7  . Smokeless tobacco: Never Used  Substance Use Topics  . Alcohol use: No    Alcohol/week: 0.0 standard drinks    Family History  Problem Relation Age of Onset  . Hypertension Father   . Heart attack Father     Review of Systems    Constitutional: Negative for chills and fever.  Eyes: Negative for blurred vision and double vision.  Respiratory: Negative for cough and shortness of breath.   Cardiovascular: Negative for chest pain, palpitations and leg swelling.  Gastrointestinal: Negative for abdominal pain, nausea and vomiting.  Neurological: Negative for dizziness.    Objective  Vitals as reported by the patient: none  Gen: :AAOx3, NAD Speaking comfortably in full sentences   ASSESSMENT and PLAN  1. Hypertension, unspecified type Stable. Cont current regime - chlorthalidone (HYGROTON) 25 MG tablet; Take 1 tablet (25 mg total) by mouth daily.  2. Hypokalemia Labs pending, on supplementation - potassium chloride SA (KLOR-CON M20) 20 MEQ tablet; Take 1 tablet (20 mEq total) by mouth daily  3. Anxiety and depression Controlled. Continue current regime.  - FLUoxetine (PROZAC) 20 MG capsule; Take 3 capsules (60 mg total) by mouth daily.  4. Pure hypercholesterolemia Checking labs, medications will be adjusted as needed.  - Comprehensive metabolic panel; Future - Lipid panel; Future - atorvastatin (LIPITOR) 20 MG tablet; Take 1 tablet (20 mg total) by mouth daily.  5. Prediabetes Labs pending, cont LFM - Hemoglobin A1c; Future  FOLLOW-UP: 6 months as long as labs are ok   The above assessment and management plan was discussed with the patient. The patient verbalized understanding of and has agreed to the management plan. Patient is aware to call the clinic if symptoms persist or worsen. Patient is aware when to return to the clinic for a follow-up visit. Patient educated on when it is appropriate to go to the emergency department.     Rutherford Guys, MD Primary [facility] at [facility] [place], [place] [address] Ph.  [phone] Fax [phone]

## 2019-11-26 DIAGNOSIS — M7541 Impingement syndrome of right shoulder: Secondary | ICD-10-CM | POA: Diagnosis not present

## 2019-11-29 ENCOUNTER — Encounter: Payer: Self-pay | Admitting: *Deleted

## 2020-02-07 ENCOUNTER — Inpatient Hospital Stay (HOSPITAL_COMMUNITY)
Admission: EM | Admit: 2020-02-07 | Discharge: 2020-02-11 | DRG: 392 | Disposition: A | Discharge status: Home / Self Care | Payer: PPO | Attending: Internal Medicine | Admitting: Internal Medicine

## 2020-02-07 ENCOUNTER — Encounter (HOSPITAL_COMMUNITY): Payer: Self-pay | Admitting: Emergency Medicine

## 2020-02-07 ENCOUNTER — Other Ambulatory Visit: Payer: Self-pay

## 2020-02-07 DIAGNOSIS — Z9049 Acquired absence of other specified parts of digestive tract: Secondary | ICD-10-CM | POA: Diagnosis not present

## 2020-02-07 DIAGNOSIS — R3129 Other microscopic hematuria: Secondary | ICD-10-CM | POA: Diagnosis present

## 2020-02-07 DIAGNOSIS — K668 Other specified disorders of peritoneum: Secondary | ICD-10-CM

## 2020-02-07 DIAGNOSIS — K8020 Calculus of gallbladder without cholecystitis without obstruction: Secondary | ICD-10-CM | POA: Diagnosis present

## 2020-02-07 DIAGNOSIS — E876 Hypokalemia: Secondary | ICD-10-CM | POA: Diagnosis present

## 2020-02-07 DIAGNOSIS — Z87891 Personal history of nicotine dependence: Secondary | ICD-10-CM

## 2020-02-07 DIAGNOSIS — F32A Depression, unspecified: Secondary | ICD-10-CM | POA: Diagnosis present

## 2020-02-07 DIAGNOSIS — Z79899 Other long term (current) drug therapy: Secondary | ICD-10-CM

## 2020-02-07 DIAGNOSIS — E785 Hyperlipidemia, unspecified: Secondary | ICD-10-CM | POA: Diagnosis not present

## 2020-02-07 DIAGNOSIS — Z90710 Acquired absence of both cervix and uterus: Secondary | ICD-10-CM

## 2020-02-07 DIAGNOSIS — Z20822 Contact with and (suspected) exposure to covid-19: Secondary | ICD-10-CM | POA: Diagnosis present

## 2020-02-07 DIAGNOSIS — F419 Anxiety disorder, unspecified: Secondary | ICD-10-CM | POA: Diagnosis not present

## 2020-02-07 DIAGNOSIS — E7800 Pure hypercholesterolemia, unspecified: Secondary | ICD-10-CM | POA: Diagnosis present

## 2020-02-07 DIAGNOSIS — K5720 Diverticulitis of large intestine with perforation and abscess without bleeding: Principal | ICD-10-CM | POA: Diagnosis present

## 2020-02-07 DIAGNOSIS — R509 Fever, unspecified: Secondary | ICD-10-CM | POA: Diagnosis not present

## 2020-02-07 DIAGNOSIS — G4733 Obstructive sleep apnea (adult) (pediatric): Secondary | ICD-10-CM | POA: Diagnosis present

## 2020-02-07 DIAGNOSIS — I10 Essential (primary) hypertension: Secondary | ICD-10-CM | POA: Diagnosis not present

## 2020-02-07 DIAGNOSIS — Z96612 Presence of left artificial shoulder joint: Secondary | ICD-10-CM | POA: Diagnosis not present

## 2020-02-07 DIAGNOSIS — K5780 Diverticulitis of intestine, part unspecified, with perforation and abscess without bleeding: Secondary | ICD-10-CM | POA: Diagnosis not present

## 2020-02-07 DIAGNOSIS — Z7982 Long term (current) use of aspirin: Secondary | ICD-10-CM

## 2020-02-07 DIAGNOSIS — Z8249 Family history of ischemic heart disease and other diseases of the circulatory system: Secondary | ICD-10-CM

## 2020-02-07 DIAGNOSIS — R1032 Left lower quadrant pain: Secondary | ICD-10-CM | POA: Diagnosis not present

## 2020-02-07 DIAGNOSIS — R109 Unspecified abdominal pain: Secondary | ICD-10-CM | POA: Diagnosis not present

## 2020-02-07 LAB — URINALYSIS, ROUTINE W REFLEX MICROSCOPIC
Bilirubin Urine: NEGATIVE
Glucose, UA: NEGATIVE mg/dL
Ketones, ur: NEGATIVE mg/dL
Leukocytes,Ua: NEGATIVE
Nitrite: NEGATIVE
Protein, ur: NEGATIVE mg/dL
Specific Gravity, Urine: 1.018 (ref 1.005–1.030)
pH: 6 (ref 5.0–8.0)

## 2020-02-07 NOTE — ED Triage Notes (Signed)
Patient complains of left flank pain that started last night. Was seen at urgent care for same today and sent to ED for hematuria.

## 2020-02-08 ENCOUNTER — Encounter (HOSPITAL_COMMUNITY): Payer: Self-pay | Admitting: Student

## 2020-02-08 ENCOUNTER — Other Ambulatory Visit: Payer: Self-pay

## 2020-02-08 ENCOUNTER — Emergency Department (HOSPITAL_COMMUNITY): Payer: PPO

## 2020-02-08 DIAGNOSIS — Z9049 Acquired absence of other specified parts of digestive tract: Secondary | ICD-10-CM | POA: Diagnosis not present

## 2020-02-08 DIAGNOSIS — Z20822 Contact with and (suspected) exposure to covid-19: Secondary | ICD-10-CM | POA: Diagnosis present

## 2020-02-08 DIAGNOSIS — K8020 Calculus of gallbladder without cholecystitis without obstruction: Secondary | ICD-10-CM | POA: Diagnosis present

## 2020-02-08 DIAGNOSIS — F32A Depression, unspecified: Secondary | ICD-10-CM | POA: Diagnosis present

## 2020-02-08 DIAGNOSIS — R3129 Other microscopic hematuria: Secondary | ICD-10-CM | POA: Diagnosis present

## 2020-02-08 DIAGNOSIS — R109 Unspecified abdominal pain: Secondary | ICD-10-CM | POA: Diagnosis present

## 2020-02-08 DIAGNOSIS — F419 Anxiety disorder, unspecified: Secondary | ICD-10-CM | POA: Diagnosis present

## 2020-02-08 DIAGNOSIS — K5720 Diverticulitis of large intestine with perforation and abscess without bleeding: Secondary | ICD-10-CM | POA: Diagnosis present

## 2020-02-08 DIAGNOSIS — K5780 Diverticulitis of intestine, part unspecified, with perforation and abscess without bleeding: Secondary | ICD-10-CM | POA: Diagnosis not present

## 2020-02-08 DIAGNOSIS — Z8249 Family history of ischemic heart disease and other diseases of the circulatory system: Secondary | ICD-10-CM | POA: Diagnosis not present

## 2020-02-08 DIAGNOSIS — Z87891 Personal history of nicotine dependence: Secondary | ICD-10-CM | POA: Diagnosis not present

## 2020-02-08 DIAGNOSIS — Z96612 Presence of left artificial shoulder joint: Secondary | ICD-10-CM | POA: Diagnosis present

## 2020-02-08 DIAGNOSIS — Z90710 Acquired absence of both cervix and uterus: Secondary | ICD-10-CM | POA: Diagnosis not present

## 2020-02-08 DIAGNOSIS — K668 Other specified disorders of peritoneum: Secondary | ICD-10-CM

## 2020-02-08 DIAGNOSIS — E785 Hyperlipidemia, unspecified: Secondary | ICD-10-CM

## 2020-02-08 DIAGNOSIS — E7800 Pure hypercholesterolemia, unspecified: Secondary | ICD-10-CM | POA: Diagnosis present

## 2020-02-08 DIAGNOSIS — E876 Hypokalemia: Secondary | ICD-10-CM | POA: Diagnosis present

## 2020-02-08 DIAGNOSIS — Z79899 Other long term (current) drug therapy: Secondary | ICD-10-CM | POA: Diagnosis not present

## 2020-02-08 DIAGNOSIS — G4733 Obstructive sleep apnea (adult) (pediatric): Secondary | ICD-10-CM | POA: Diagnosis present

## 2020-02-08 DIAGNOSIS — I10 Essential (primary) hypertension: Secondary | ICD-10-CM | POA: Diagnosis present

## 2020-02-08 DIAGNOSIS — Z7982 Long term (current) use of aspirin: Secondary | ICD-10-CM | POA: Diagnosis not present

## 2020-02-08 LAB — COMPREHENSIVE METABOLIC PANEL
ALT: 16 U/L (ref 0–44)
AST: 19 U/L (ref 15–41)
Albumin: 3.7 g/dL (ref 3.5–5.0)
Alkaline Phosphatase: 88 U/L (ref 38–126)
Anion gap: 12 (ref 5–15)
BUN: 11 mg/dL (ref 8–23)
CO2: 25 mmol/L (ref 22–32)
Calcium: 9.1 mg/dL (ref 8.9–10.3)
Chloride: 99 mmol/L (ref 98–111)
Creatinine, Ser: 0.83 mg/dL (ref 0.44–1.00)
GFR, Estimated: >60 mL/min (ref >60)
Glucose, Bld: 176 mg/dL — ABNORMAL HIGH (ref 70–99)
Potassium: 2.5 mmol/L — CL (ref 3.5–5.1)
Sodium: 136 mmol/L (ref 135–145)
Total Bilirubin: 1.2 mg/dL (ref 0.3–1.2)
Total Protein: 7 g/dL (ref 6.5–8.1)

## 2020-02-08 LAB — CBC WITH DIFFERENTIAL/PLATELET
Abs Immature Granulocytes: 0.03 10*3/uL (ref 0.00–0.07)
Basophils Absolute: 0 10*3/uL (ref 0.0–0.1)
Basophils Relative: 0 %
Eosinophils Absolute: 0.2 10*3/uL (ref 0.0–0.5)
Eosinophils Relative: 2 %
HCT: 35.6 % — ABNORMAL LOW (ref 36.0–46.0)
Hemoglobin: 12.9 g/dL (ref 12.0–15.0)
Immature Granulocytes: 0 %
Lymphocytes Relative: 28 %
Lymphs Abs: 2.9 10*3/uL (ref 0.7–4.0)
MCH: 30.7 pg (ref 26.0–34.0)
MCHC: 36.2 g/dL — ABNORMAL HIGH (ref 30.0–36.0)
MCV: 84.8 fL (ref 80.0–100.0)
Monocytes Absolute: 0.8 10*3/uL (ref 0.1–1.0)
Monocytes Relative: 7 %
Neutro Abs: 6.7 10*3/uL (ref 1.7–7.7)
Neutrophils Relative %: 63 %
Platelets: 292 10*3/uL (ref 150–400)
RBC: 4.2 MIL/uL (ref 3.87–5.11)
RDW: 13.4 % (ref 11.5–15.5)
WBC: 10.6 10*3/uL — ABNORMAL HIGH (ref 4.0–10.5)
nRBC: 0 % (ref 0.0–0.2)

## 2020-02-08 LAB — LIPASE, BLOOD: Lipase: 31 U/L (ref 11–51)

## 2020-02-08 LAB — SARS CORONAVIRUS 2 BY RT PCR (HOSPITAL ORDER, PERFORMED IN ~~LOC~~ HOSPITAL LAB): SARS Coronavirus 2: NEGATIVE

## 2020-02-08 LAB — GLUCOSE, CAPILLARY: Glucose-Capillary: 105 mg/dL — ABNORMAL HIGH (ref 70–99)

## 2020-02-08 LAB — MAGNESIUM: Magnesium: 1.7 mg/dL (ref 1.7–2.4)

## 2020-02-08 LAB — TROPONIN I (HIGH SENSITIVITY): Troponin I (High Sensitivity): 6 ng/L (ref <18)

## 2020-02-08 LAB — HIV ANTIBODY (ROUTINE TESTING W REFLEX): HIV Screen 4th Generation wRfx: NONREACTIVE

## 2020-02-08 MED ORDER — ONDANSETRON HCL 4 MG/2ML IJ SOLN: 4.0000 mg | Freq: Four times a day (QID) | INTRAMUSCULAR | Status: DC | PRN

## 2020-02-08 MED ORDER — SODIUM CHLORIDE 0.9 % IV SOLN: INTRAVENOUS | Status: AC

## 2020-02-08 MED ORDER — KCL-LACTATED RINGERS-D5W 20 MEQ/L IV SOLN
INTRAVENOUS | Status: DC
  Filled 2020-02-08 (×4): qty 1000

## 2020-02-08 MED ORDER — ACETAMINOPHEN 650 MG RE SUPP: 650.0000 mg | Freq: Four times a day (QID) | RECTAL | Status: DC | PRN

## 2020-02-08 MED ORDER — PIPERACILLIN-TAZOBACTAM 3.375 G IVPB 30 MIN
3.3750 g | Freq: Once | INTRAVENOUS | Status: AC
  Administered 2020-02-08: 3.375 g via INTRAVENOUS
  Filled 2020-02-08: qty 50

## 2020-02-08 MED ORDER — MAGNESIUM SULFATE 2 GM/50ML IV SOLN
2.0000 g | Freq: Once | INTRAVENOUS | Status: AC
  Administered 2020-02-08: 2 g via INTRAVENOUS
  Filled 2020-02-08 (×2): qty 50

## 2020-02-08 MED ORDER — POTASSIUM CHLORIDE 10 MEQ/100ML IV SOLN
10.0000 meq | INTRAVENOUS | Status: AC
  Administered 2020-02-08 (×2): 10 meq via INTRAVENOUS

## 2020-02-08 MED ORDER — MORPHINE SULFATE (PF) 4 MG/ML IV SOLN
4.0000 mg | Freq: Once | INTRAVENOUS | Status: AC
  Administered 2020-02-08: 4 mg via INTRAVENOUS
  Filled 2020-02-08: qty 1

## 2020-02-08 MED ORDER — ACETAMINOPHEN 325 MG PO TABS: 650.0000 mg | ORAL_TABLET | Freq: Four times a day (QID) | ORAL | Status: DC | PRN

## 2020-02-08 MED ORDER — ONDANSETRON HCL 4 MG/2ML IJ SOLN
4.0000 mg | Freq: Once | INTRAMUSCULAR | Status: AC
  Administered 2020-02-08: 4 mg via INTRAVENOUS
  Filled 2020-02-08: qty 2

## 2020-02-08 MED ORDER — PIPERACILLIN-TAZOBACTAM 3.375 G IVPB
3.3750 g | Freq: Three times a day (TID) | INTRAVENOUS | Status: DC
  Administered 2020-02-08 – 2020-02-11 (×9): 3.375 g via INTRAVENOUS
  Filled 2020-02-08 (×9): qty 50

## 2020-02-08 MED ORDER — HYDRALAZINE HCL 20 MG/ML IJ SOLN: 5.0000 mg | INTRAMUSCULAR | Status: DC | PRN

## 2020-02-08 MED ORDER — POTASSIUM CHLORIDE 10 MEQ/100ML IV SOLN
10.0000 meq | Freq: Once | INTRAVENOUS | Status: AC
  Administered 2020-02-08: 10 meq via INTRAVENOUS
  Filled 2020-02-08: qty 100

## 2020-02-08 MED ORDER — POTASSIUM CHLORIDE 10 MEQ/100ML IV SOLN
10.0000 meq | INTRAVENOUS | Status: DC
  Administered 2020-02-08: 10 meq via INTRAVENOUS
  Filled 2020-02-08: qty 100

## 2020-02-08 MED ORDER — IOHEXOL 300 MG/ML  SOLN
100.0000 mL | Freq: Once | INTRAMUSCULAR | Status: AC | PRN
  Administered 2020-02-08: 100 mL via INTRAVENOUS

## 2020-02-08 MED ORDER — POTASSIUM CHLORIDE CRYS ER 20 MEQ PO TBCR: 40.0000 meq | EXTENDED_RELEASE_TABLET | Freq: Once | ORAL | Status: DC

## 2020-02-08 MED ORDER — POTASSIUM CHLORIDE 10 MEQ/100ML IV SOLN
10.0000 meq | INTRAVENOUS | Status: AC
  Administered 2020-02-08 (×4): 10 meq via INTRAVENOUS
  Filled 2020-02-08 (×3): qty 100

## 2020-02-08 MED ORDER — MORPHINE SULFATE (PF) 2 MG/ML IV SOLN
1.0000 mg | INTRAVENOUS | Status: DC | PRN
  Administered 2020-02-08 – 2020-02-09 (×4): 1 mg via INTRAVENOUS
  Filled 2020-02-08 (×4): qty 1

## 2020-02-08 MED ORDER — FENTANYL CITRATE (PF) 100 MCG/2ML IJ SOLN
50.0000 ug | Freq: Once | INTRAMUSCULAR | Status: AC
  Administered 2020-02-08: 50 ug via INTRAVENOUS
  Filled 2020-02-08: qty 2

## 2020-02-08 NOTE — ED Notes (Signed)
Transport request put in for patient.

## 2020-02-08 NOTE — Consult Note (Signed)
Traci Sermon [birth year]  [id number].    Requesting MD: Dr. Marlowe Sax Chief Complaint/Reason for Consult: Perforated Diverticulitis   HPI: JULES VIDOVICH is a 65 y.o. female with a hx of [name] and HTN who presented to the ED on 1/20 for abdominal pain.   Patient reports on the evening of 1/19 she started developing gradual onset of constant, mild left lower quadrant abdominal pain.  She reports while at work the next day, she began running a fever and had worsening left lower quadrant pain without radiation up to a 7/10.  She tried ibuprofen for her symptoms without any relief.  She denies history of similar symptoms in the past.  No associated chills, nausea, vomiting, diarrhea, constipation, melena or hematochezia.  She had a normal bowel movement on Thursday.  She is passing flatus. She presented to the ED for evaluation.  In the ED patient was afebrile (T-max 99.8), without tachycardia or hypotension.  WBC 10.6.  She underwent CT scan that showed perforated distal descending colon diverticulitis with surrounding inflammatory changes and a small amount of pneumoperitoneum under the hemidiaphragms.  She was admitted to the hospitalist service.  We were asked to see.  Patient denies history of diverticulitis in the past.  She had a colonoscopy at age 50 that she reports was normal (she does not know where this was done).  She denies family history of IBD or colon cancer.  She reports since admission her pain has improved from a 7/10 to a 4/10.  Only blood thinners 81 mg aspirin.  Prior abdominal surgeries include laparoscopic appendectomy and a hysterectomy.  ROS: Review of Systems  Constitutional: Positive for fever. Negative for chills.  Respiratory: Negative for cough and shortness of breath.   Cardiovascular: Negative for chest pain.  Gastrointestinal: Positive for abdominal pain. Negative for blood in stool, constipation, diarrhea, melena, nausea and vomiting.  Genitourinary: Positive  for frequency and hematuria. Negative for dysuria.  Musculoskeletal: Negative for back pain.  Psychiatric/Behavioral: Negative for substance abuse.  All other systems reviewed and are negative.   Family History  Problem Relation Age of Onset  . Hypertension Father   . Heart attack Father     Past Medical History:  Diagnosis Date  . Depression   . Hypertension   . [name]     Past Surgical History:  Procedure Laterality Date  . APPENDECTOMY  09/26/2018  . HYSTERECTOMY ABDOMINAL WITH SALPINGO-OOPHORECTOMY  2000   DUB  . JOINT REPLACEMENT    . LAPAROSCOPIC APPENDECTOMY N/A 09/26/2018   Procedure: APPENDECTOMY LAPAROSCOPIC;  Surgeon: Kinsinger, Arta Bruce, MD;  Location: [facility];  Service: General;  Laterality: N/A;  . SHOULDER SURGERY Left    01-02-2018, has had R shoulder rotator cuff previously    Social History:  reports that she quit smoking about 22 years ago. She has a 60.00 pack-year smoking history. She has never used smokeless tobacco. She reports that she does not drink alcohol and does not use drugs. No current tobacco use.  She does have a 60-pack-year history and quit smoking 22 years ago.  No illicit drug use.  Occasional alcohol use.  Works at [employer].  Lives at home with her roommate.  Allergies: No Known Allergies  (Not in a hospital admission)    Physical Exam: Blood pressure 113/77, pulse 69, temperature 98.2 F (36.8 C), temperature source Oral, resp. rate 14, height 5\' 1"  (1.549 m), weight 74.4 kg, SpO2 93 %. General: pleasant,  WD/WN white female who is laying in bed in NAD HEENT: head is normocephalic, atraumatic.  Sclera are noninjected.  PERRL.  Ears and nose without any masses or lesions.  Mouth is pink and moist. Dentition fair Heart: regular, rate, and rhythm.  Normal s1,s2. No obvious murmurs, gallops, or rubs noted.  Palpable radial and pedal pulses bilaterally  Lungs: CTAB, no wheezes, rhonchi, or rales noted.  Respiratory effort  nonlabored Abd: Soft, mild distension, LLQ abdominal pain without rebound, rigidity and guarding. +BS, no masses, hernias, or organomegaly. Prior abdominal scars are well healed.  MS: no BUE/BLE edema, calves soft and nontender Skin: warm and dry with no masses, lesions, or rashes Psych: A&Ox4 with an appropriate affect Neuro: cranial nerves grossly intact, equal strength in BUE/BLE bilaterally, normal speech, thought process intact. Gait not assessed.   Results for orders placed or performed during the hospital encounter of 02/07/20 (from the past 48 hour(s))  Urinalysis, Routine w reflex microscopic Urine, Clean Catch     Status: Abnormal   Collection Time: 02/07/20  4:40 PM  Result Value Ref Range   Color, Urine YELLOW YELLOW   APPearance CLEAR CLEAR   Specific Gravity, Urine 1.018 1.005 - 1.030   pH 6.0 5.0 - 8.0   Glucose, UA NEGATIVE NEGATIVE mg/dL   Hgb urine dipstick MODERATE (A) NEGATIVE   Bilirubin Urine NEGATIVE NEGATIVE   Ketones, ur NEGATIVE NEGATIVE mg/dL   Protein, ur NEGATIVE NEGATIVE mg/dL   Nitrite NEGATIVE NEGATIVE   Leukocytes,Ua NEGATIVE NEGATIVE   RBC / HPF 6-10 0 - 5 RBC/hpf   WBC, UA 0-5 0 - 5 WBC/hpf   Bacteria, UA RARE (A) NONE SEEN   Squamous Epithelial / LPF 0-5 0 - 5   Mucus PRESENT     Comment: Performed at [facility], [address], [place], [place] [address]  Comprehensive metabolic panel     Status: Abnormal   Collection Time: 02/08/20 12:49 AM  Result Value Ref Range   Sodium 136 135 - 145 mmol/L   Potassium 2.5 (LL) 3.5 - 5.1 mmol/L    Comment: CRITICAL RESULT CALLED TO, READ BACK BY AND VERIFIED WITH: Marylen Ponto RN [id number] 0221 M GARRETT    Chloride 99 98 - 111 mmol/L   CO2 25 22 - 32 mmol/L   Glucose, Bld 176 (H) 70 - 99 mg/dL    Comment: Glucose reference range applies only to samples taken after fasting for at least 8 hours.   BUN 11 8 - 23 mg/dL   Creatinine, Ser 0.83 0.44 - 1.00 mg/dL   Calcium 9.1 8.9 - 10.3 mg/dL    Total Protein 7.0 6.5 - 8.1 g/dL   Albumin 3.7 3.5 - 5.0 g/dL   AST 19 15 - 41 U/L   ALT 16 0 - 44 U/L   Alkaline Phosphatase 88 38 - 126 U/L   Total Bilirubin 1.2 0.3 - 1.2 mg/dL   GFR, Estimated >60 >60 mL/min    Comment: (NOTE) Calculated using the CKD-EPI Creatinine Equation (2021)    Anion gap 12 5 - 15    Comment: Performed at [facility] [address], [place], [place] [address]  CBC with Differential     Status: Abnormal   Collection Time: 02/08/20 12:49 AM  Result Value Ref Range   WBC 10.6 (H) 4.0 - 10.5 K/uL   RBC 4.20 3.87 - 5.11 MIL/uL   Hemoglobin 12.9 12.0 - 15.0 g/dL   HCT 35.6 (L) 36.0 -  46.0 %   MCV 84.8 80.0 - 100.0 fL   MCH 30.7 26.0 - 34.0 pg   MCHC 36.2 (H) 30.0 - 36.0 g/dL   RDW 13.4 11.5 - 15.5 %   Platelets 292 150 - 400 K/uL   nRBC 0.0 0.0 - 0.2 %   Neutrophils Relative % 63 %   Neutro Abs 6.7 1.7 - 7.7 K/uL   Lymphocytes Relative 28 %   Lymphs Abs 2.9 0.7 - 4.0 K/uL   Monocytes Relative 7 %   Monocytes Absolute 0.8 0.1 - 1.0 K/uL   Eosinophils Relative 2 %   Eosinophils Absolute 0.2 0.0 - 0.5 K/uL   Basophils Relative 0 %   Basophils Absolute 0.0 0.0 - 0.1 K/uL   Immature Granulocytes 0 %   Abs Immature Granulocytes 0.03 0.00 - 0.07 K/uL    Comment: Performed at [facility] [address] [place], [place] [address]  Lipase, blood     Status: None   Collection Time: 02/08/20 12:49 AM  Result Value Ref Range   Lipase 31 11 - 51 U/L    Comment: Performed at [facility] [address], [place], [place] [address]  Magnesium     Status: None   Collection Time: 02/08/20  1:05 AM  Result Value Ref Range   Magnesium 1.7 1.7 - 2.4 mg/dL    Comment: Performed at [facility] [address] [place], [place] [address]  SARS Coronavirus 2 by RT PCR (hospital order, performed in [facility] hospital lab) Nasopharyngeal Nasopharyngeal Swab     Status: None   Collection Time: 02/08/20  4:25 AM   Specimen: Nasopharyngeal  Swab  Result Value Ref Range   SARS Coronavirus 2 NEGATIVE NEGATIVE    Comment: (NOTE) SARS-CoV-2 target nucleic acids are NOT DETECTED.  The SARS-CoV-2 RNA is generally detectable in upper and lower respiratory specimens during the acute phase of infection. The lowest concentration of SARS-CoV-2 viral copies this assay can detect is 250 copies / mL. A negative result does not preclude SARS-CoV-2 infection and should not be used as the sole basis for treatment or other patient management decisions.  A negative result may occur with improper specimen collection / handling, submission of specimen other than nasopharyngeal swab, presence of viral mutation(s) within the areas targeted by this assay, and inadequate number of viral copies (<250 copies / mL). A negative result must be combined with clinical observations, patient history, and epidemiological information.  Fact Sheet for Patients:   StrictlyIdeas.no  Fact Sheet for Healthcare Providers: BankingDealers.co.za  This test is not yet approved or  cleared by the [place] FDA and has been authorized for detection and/or diagnosis of SARS-CoV-2 by FDA under an Emergency Use Authorization (EUA).  This EUA will remain in effect (meaning this test can be used) for the duration of the COVID-19 declaration under Section 564(b)(1) of the Act, 21 U.S.C. section 360bbb-3(b)(1), unless the authorization is terminated or revoked sooner.  Performed at [facility], [place] [address] [place], [place] [address]   Troponin I (High Sensitivity)     Status: None   Collection Time: 02/08/20  4:36 AM  Result Value Ref Range   Troponin I (High Sensitivity) 6 <18 ng/L    Comment: (NOTE) Elevated high sensitivity troponin I (hsTnI) values and significant  changes across serial measurements may suggest ACS but many other  chronic and acute conditions are known to elevate hsTnI results.   Refer  to the "Links" section for chest pain algorithms and additional  guidance. Performed at [facility], [place] [address] [place], [place] [address]    CT Abdomen Pelvis W Contrast  Result Date: 02/08/2020 CLINICAL DATA:  Left lower quadrant abdominal pain EXAM: CT ABDOMEN AND PELVIS WITH CONTRAST TECHNIQUE: Multidetector CT imaging of the abdomen and pelvis was performed using the standard protocol following bolus administration of intravenous contrast. CONTRAST:  159mL OMNIPAQUE IOHEXOL 300 MG/ML  SOLN COMPARISON:  None. FINDINGS: Lower chest: The visualized heart size within normal limits. No pericardial fluid/thickening. No hiatal hernia. Streaky airspace opacity seen at both lung bases. Hepatobiliary: The liver is normal in density without focal abnormality.The main portal vein is patent. No evidence of calcified gallstones, gallbladder wall thickening or biliary dilatation. Pancreas: Unremarkable. No pancreatic ductal dilatation or surrounding inflammatory changes. Spleen: Normal in size without focal abnormality. Adrenals/Urinary Tract: Both adrenal glands appear normal. The kidneys and collecting system appear normal without evidence of urinary tract calculus or hydronephrosis. Bladder is unremarkable. Stomach/Bowel: The stomach, small bowel are normal in appearance. There is a focal segment of distal descending colon measuring 6.5 cm with diverticula and surrounding fat stranding changes and small foci of free air. No loculated fluid collections are noted. Vascular/Lymphatic: There are no enlarged mesenteric, retroperitoneal, or pelvic lymph nodes. Scattered aortic atherosclerotic calcifications are seen without aneurysmal dilatation. Reproductive: The patient is status post hysterectomy. No adnexal masses or collections seen. Other: Small amount of pneumoperitoneum seen under the hemidiaphragm as well as within the anterior upper abdomen. Musculoskeletal: No acute or significant osseous  findings. IMPRESSION: Perforated distal descending colon diverticulitis with surrounding inflammatory changes. No pericolonic abscess Small amount of pneumoperitoneum under the hemidiaphragms Aortic Atherosclerosis (ICD10-I70.0). Electronically Signed   By: Prudencio Pair M.D.   On: 02/08/2020 03:03   Anti-infectives (From admission, onward)   Start     Dose/Rate Route Frequency Ordered Stop   02/08/20 1200  piperacillin-tazobactam (ZOSYN) IVPB 3.375 g        3.375 g 12.5 mL/hr over 240 Minutes Intravenous Every 8 hours 02/08/20 0447     02/08/20 0400  piperacillin-tazobactam (ZOSYN) IVPB 3.375 g        3.375 g 100 mL/hr over 30 Minutes Intravenous  Once 02/08/20 0357 02/08/20 0510       Assessment/Plan [name]  HTN  Hypokalemia   Perforated Diverticulitis - This is a 65 y.o. female who presents with 2 days of LLQ abdominal pain with reported fever at home. Since presentation she is afebrile (T-max 99.8), without tachycardia or hypotension.  WBC 10.6.  She underwent CT scan that showed perforated distal descending colon diverticulitis with surrounding inflammatory changes and a small amount of pneumoperitoneum under the hemidiaphragms. Her pain has improved since admission from a 7/10 to a 4/10. She has tenderness in the LLQ without peritonitis. Reviewed CT with attending. No indication for emergency surgery - Continue IV abx - Bowel rest - This appears to be the patients first episode of diverticulitis. Hopefully she will improve with conservative tx. We discussed if she was to worsen she may require surgery during admission that would likely result in a colostomy.  - If patient improves with conservative tx, would recommend a colonoscopy in 4-6 weeks. She had a colonoscopy at age 89 that she reports was normal.   - We will follow with you  FEN - Strict NPO, IVF VTE - SCDs, okay for chemical prophyalxis from a general surgery standpoint ID -  Zosyn   Jillyn Ledger, [facility] Surgery 02/08/2020, 8:10 AM Please see Amion for pager number during day hours 7:00am-4:30pm

## 2020-02-08 NOTE — ED Notes (Signed)
Pt placed on 2L [place] for sats dropping to 89% when sleeping.  She stated she has sleep apnea.

## 2020-02-08 NOTE — H&P (Signed)
History and Physical    SHERRILEE STEIGERWALD [id number] DOB: [birth year] DOA: 02/07/2020  PCP: [name], [name], MD Patient coming from: Home  Chief Complaint: Left-sided abdominal pain  HPI: April Stevens is a 65 y.o. female with medical history significant of hypertension, depression, [name], surgical history of appendectomy and hysterectomy with salpingo-oophorectomy presenting with complaint of left-sided abdominal pain.  Patient reports 1 day history of progressively worsening sharp left lower quadrant abdominal pain.  Denies flank pain.  Denies nausea or vomiting.  The pain was 9 out of 10 in intensity before she received pain medications.  She had a regular bowel movement yesterday, no bloody stools.  She was febrile at home when her symptoms started and fever resolved after she took ibuprofen.  Patient has no other complaints.  Denies cough, shortness of breath, or chest pain.  She is not vaccinated against COVID.  ED Course: Blood pressure elevated on arrival, remainder of vital signs stable.  WBC 10.6, hemoglobin 12.9, platelet count 292K.  Sodium 136, potassium 2.5, chloride 99, bicarb 25, BUN 11, creatinine 0.8, glucose 176.  Magnesium level pending.  No gross hematuria, urine microscopic examination showing 6-10 RBCs/hpf.  CT showing perforated distal descending colon diverticulitis with surrounding inflammatory changes.  No pericolonic abscess.  Small amount of pneumoperitoneum under the hemidiaphragms. Patient was given morphine, Zofran, Zosyn, and potassium supplementation.  ED provider discussed the case with Dr. Georgette Dover from general surgery who recommended continuing antibiotic therapy and surgery team will see the patient in the morning.  Review of Systems:  All systems reviewed and apart from history of presenting illness, are negative.  Past Medical History:  Diagnosis Date  . Depression   . Hypertension   . [name]     Past Surgical History:  Procedure Laterality  Date  . APPENDECTOMY  09/26/2018  . HYSTERECTOMY ABDOMINAL WITH SALPINGO-OOPHORECTOMY  2000   DUB  . JOINT REPLACEMENT    . LAPAROSCOPIC APPENDECTOMY N/A 09/26/2018   Procedure: APPENDECTOMY LAPAROSCOPIC;  Surgeon: Kinsinger, Arta Bruce, MD;  Location: [facility];  Service: General;  Laterality: N/A;  . SHOULDER SURGERY Left    01-02-2018, has had R shoulder rotator cuff previously     reports that she quit smoking about 22 years ago. She has a 60.00 pack-year smoking history. She has never used smokeless tobacco. She reports that she does not drink alcohol and does not use drugs.  No Known Allergies  Family History  Problem Relation Age of Onset  . Hypertension Father   . Heart attack Father     Prior to Admission medications   Medication Sig Start Date End Date Taking? Authorizing Provider  aspirin 81 MG tablet You can restart your baby aspirin 1 daily 09/28/2018. Patient taking differently: Take 81 mg by mouth at bedtime. 09/26/18  Yes Earnstine Regal, PA-C  atorvastatin (LIPITOR) 20 MG tablet Take 1 tablet (20 mg total) by mouth daily. Patient taking differently: Take 20 mg by mouth at bedtime. 10/02/19  Yes [name], [name], MD  chlorthalidone (HYGROTON) 25 MG tablet Take 1 tablet (25 mg total) by mouth daily. Patient taking differently: Take 25 mg by mouth at bedtime. 10/02/19  Yes [name], [name], MD  FLUoxetine (PROZAC) 20 MG capsule Take 3 capsules (60 mg total) by mouth daily. Patient taking differently: Take 60 mg by mouth at bedtime. 10/02/19  Yes [name], [name], MD  potassium chloride SA (KLOR-CON M20) 20 MEQ tablet Take 1  tablet (20 mEq total) by mouth daily. Patient taking differently: Take 20 mEq by mouth at bedtime. 10/02/19  Yes [name], [name], MD  vitamin B-12 (CYANOCOBALAMIN) 1000 MCG tablet Take 1,000 mcg by mouth at bedtime.   Yes [provider]    Physical Exam: Vitals:   02/08/20 0215 02/08/20 0230 02/08/20 0245 02/08/20 0400  BP:  115/60 109/63 (!) 124/52 120/69  Pulse: 64 64 70 66  Resp: 16 12 17 12   Temp:      TempSrc:      SpO2: 91% 93% 96% 92%  Weight:      Height:        Physical Exam Constitutional:      General: She is not in acute distress. HENT:     Head: Normocephalic and atraumatic.  Eyes:     Extraocular Movements: Extraocular movements intact.     Conjunctiva/sclera: Conjunctivae normal.  Cardiovascular:     Rate and Rhythm: Normal rate and regular rhythm.     Pulses: Normal pulses.  Pulmonary:     Effort: Pulmonary effort is normal. No respiratory distress.     Breath sounds: Normal breath sounds. No wheezing or rales.  Abdominal:     General: Bowel sounds are normal. There is distension.     Palpations: Abdomen is soft.     Tenderness: There is abdominal tenderness. There is guarding.     Comments: Left lower quadrant tender to palpation with guarding  Musculoskeletal:        General: No swelling or tenderness.     Cervical back: Normal range of motion and neck supple.  Skin:    General: Skin is warm and dry.  Neurological:     General: No focal deficit present.     Mental Status: She is alert and oriented to person, place, and time.     Labs on Admission: I have personally reviewed following labs and imaging studies  CBC: Recent Labs  Lab 02/08/20 0049  WBC 10.6*  NEUTROABS 6.7  HGB 12.9  HCT 35.6*  MCV 84.8  PLT 008   Basic Metabolic Panel: Recent Labs  Lab 02/08/20 0049  NA 136  K 2.5*  CL 99  CO2 25  GLUCOSE 176*  BUN 11  CREATININE 0.83  CALCIUM 9.1   GFR: Estimated Creatinine Clearance: 61.5 mL/min (by C-G formula based on SCr of 0.83 mg/dL). Liver Function Tests: Recent Labs  Lab 02/08/20 0049  AST 19  ALT 16  ALKPHOS 88  BILITOT 1.2  PROT 7.0  ALBUMIN 3.7   Recent Labs  Lab 02/08/20 0049  LIPASE 31   No results for input(s): AMMONIA in the last 168 hours. Coagulation Profile: No results for input(s): INR, PROTIME in the last 168  hours. Cardiac Enzymes: No results for input(s): CKTOTAL, CKMB, CKMBINDEX, TROPONINI in the last 168 hours. BNP (last 3 results) No results for input(s): PROBNP in the last 8760 hours. HbA1C: No results for input(s): HGBA1C in the last 72 hours. CBG: No results for input(s): GLUCAP in the last 168 hours. Lipid Profile: No results for input(s): CHOL, HDL, LDLCALC, TRIG, CHOLHDL, LDLDIRECT in the last 72 hours. Thyroid Function Tests: No results for input(s): TSH, T4TOTAL, FREET4, T3FREE, THYROIDAB in the last 72 hours. Anemia Panel: No results for input(s): VITAMINB12, FOLATE, FERRITIN, TIBC, IRON, RETICCTPCT in the last 72 hours. Urine analysis:    Component Value Date/Time   COLORURINE YELLOW 02/07/2020 1640   APPEARANCEUR CLEAR 02/07/2020 1640   LABSPEC 1.018  02/07/2020 1640   PHURINE 6.0 02/07/2020 1640   GLUCOSEU NEGATIVE 02/07/2020 1640   HGBUR MODERATE (A) 02/07/2020 1640   BILIRUBINUR NEGATIVE 02/07/2020 1640   KETONESUR NEGATIVE 02/07/2020 1640   PROTEINUR NEGATIVE 02/07/2020 1640   UROBILINOGEN 1.0 10/08/2014 1903   NITRITE NEGATIVE 02/07/2020 1640   LEUKOCYTESUR NEGATIVE 02/07/2020 1640    Radiological Exams on Admission: CT Abdomen Pelvis W Contrast  Result Date: 02/08/2020 CLINICAL DATA:  Left lower quadrant abdominal pain EXAM: CT ABDOMEN AND PELVIS WITH CONTRAST TECHNIQUE: Multidetector CT imaging of the abdomen and pelvis was performed using the standard protocol following bolus administration of intravenous contrast. CONTRAST:  118mL OMNIPAQUE IOHEXOL 300 MG/ML  SOLN COMPARISON:  None. FINDINGS: Lower chest: The visualized heart size within normal limits. No pericardial fluid/thickening. No hiatal hernia. Streaky airspace opacity seen at both lung bases. Hepatobiliary: The liver is normal in density without focal abnormality.The main portal vein is patent. No evidence of calcified gallstones, gallbladder wall thickening or biliary dilatation. Pancreas: Unremarkable.  No pancreatic ductal dilatation or surrounding inflammatory changes. Spleen: Normal in size without focal abnormality. Adrenals/Urinary Tract: Both adrenal glands appear normal. The kidneys and collecting system appear normal without evidence of urinary tract calculus or hydronephrosis. Bladder is unremarkable. Stomach/Bowel: The stomach, small bowel are normal in appearance. There is a focal segment of distal descending colon measuring 6.5 cm with diverticula and surrounding fat stranding changes and small foci of free air. No loculated fluid collections are noted. Vascular/Lymphatic: There are no enlarged mesenteric, retroperitoneal, or pelvic lymph nodes. Scattered aortic atherosclerotic calcifications are seen without aneurysmal dilatation. Reproductive: The patient is status post hysterectomy. No adnexal masses or collections seen. Other: Small amount of pneumoperitoneum seen under the hemidiaphragm as well as within the anterior upper abdomen. Musculoskeletal: No acute or significant osseous findings. IMPRESSION: Perforated distal descending colon diverticulitis with surrounding inflammatory changes. No pericolonic abscess Small amount of pneumoperitoneum under the hemidiaphragms Aortic Atherosclerosis (ICD10-I70.0). Electronically Signed   By: Prudencio Pair M.D.   On: 02/08/2020 03:03    EKG: Independently reviewed.  Sinus rhythm, artifact.  Borderline T wave abnormality in lateral leads appears new compared to prior tracing from 2016.  Assessment/Plan Principal Problem:   Perforated diverticulum Active Problems:   HTN (hypertension)   Hypokalemia   Pneumoperitoneum   HLD (hyperlipidemia)   Acute perforated diverticulitis with pneumoperitoneum: Presenting with complaint of left lower quadrant abdominal pain.  No fever or significant leukocytosis.  Hemodynamically stable.  She has left lower quadrant tenderness on exam but comfortable when her abdomen is not palpated.  CT showing perforated distal  descending colon diverticulitis with surrounding inflammatory changes.  No pericolonic abscess.  Small amount of pneumoperitoneum under the hemidiaphragms. -General surgery will consult in a.m. Keep NPO.  Continue Zosyn.  Morphine as needed for pain.  IV fluid hydration.  Will order screening COVID test.  Hold home aspirin.  Hypokalemia: Likely due to home diuretic use. -Hold home diuretic.  Replete potassium.  Check magnesium level and replete if low.  Continue to monitor electrolytes.  Microscopic hematuria: Urine microscopic examination showing 6-10 RBCs/hpf.  UA not suggestive of UTI.  No renal calculi identified on CT. -We will need outpatient urology follow-up for cystoscopy if hematuria is persistent.  Hypertension: Currently normotensive. -Hold chlorthalidone.  Hydralazine as needed for SBP >170.   Hyperlipidemia -Resume statin when patient is no longer n.p.o.  Depression -Resume home medication when patient is no longer n.p.o.  EKG abnormality: EKG showing borderline T wave  abnormality in lateral leads appears new compared to prior tracing from 2016.  Patient is not endorsing chest pain. -Cardiac monitoring, check high-sensitivity troponin level.  DVT prophylaxis: SCDs Code Status: Full code Family Communication: No family available at this time. Disposition Plan: Status is: Inpatient  Remains inpatient appropriate because:IV treatments appropriate due to intensity of illness or inability to take PO, Inpatient level of care appropriate due to severity of illness and Needs surgery for perforated diverticulitis   Dispo: The patient is from: Home              Anticipated d/c is to: Home              Anticipated d/c date is: 3 days              Patient currently is not medically stable to d/c.  The medical decision making on this patient was of high complexity and the patient is at high risk for clinical deterioration, therefore this is a level 3 visit.  Shela Leff  MD [facility]  If 7PM-7AM, please contact night-coverage [email or web address]  02/08/2020, 4:40 AM

## 2020-02-08 NOTE — ED Notes (Signed)
Called bed placement.  They stated pt is pending for 6N once bed is cleaned.

## 2020-02-08 NOTE — Progress Notes (Signed)
April Stevens is a 65 y.o. female with medical history significant of hypertension, depression, [name], surgical history of appendectomy and hysterectomy with salpingo-oophorectomy presenting with complaint of left-sided abdominal pain.  Patient reports 1 day history of progressively worsening sharp left lower quadrant abdominal pain.  Denies flank pain.  Denies nausea or vomiting.  The pain was 9 out of 10 in intensity before she received pain medications.  She had a regular bowel movement yesterday, no bloody stools.  She was febrile at home when her symptoms started and fever resolved after she took ibuprofen.  Patient has no other complaints.  Denies cough, shortness of breath, or chest pain.  She is not vaccinated against COVID.  ED Course: Blood pressure elevated on arrival, remainder of vital signs stable.  WBC 10.6, hemoglobin 12.9, platelet count 292K.  Sodium 136, potassium 2.5, chloride 99, bicarb 25, BUN 11, creatinine 0.8, glucose 176.  Magnesium level pending.  No gross hematuria, urine microscopic examination showing 6-10 RBCs/hpf.  CT showing perforated distal descending colon diverticulitis with surrounding inflammatory changes.  No pericolonic abscess.  Small amount of pneumoperitoneum under the hemidiaphragms. Patient was given morphine, Zofran, Zosyn, and potassium supplementation.  ED provider discussed the case with Dr. Georgette Dover from general surgery who recommended continuing antibiotic therapy and surgery team will see the patient in the morning.  02/08/20: Seen and examined at her bedside in the ED.  LLQ abdominal pain present but improved.  She is inquiring about a diet.  Seen by Gen surgery with recommendation for bowel rest and IV abx.  Electrolytes derangement that are being repleted.  Pain control in place as needed.  CPAP ordered QHS.  Please refer to H&p dictated by my partner Dr. Marlowe Sax on 02/08/20 for further details of the assessment and plan.

## 2020-02-08 NOTE — ED Provider Notes (Signed)
[facility] EMERGENCY DEPARTMENT Provider Note   CSN: [id number] Arrival date & time: 02/07/20  1634     History Chief Complaint  Patient presents with  . Flank Pain    April Stevens is a 65 y.o. female with a history of hypertension, [name], anxiety/depression, and prior appendectomy as well as hysterectomy with salpingo-oophorectomy who presents to the emergency department with complaints of left-sided abdominal pain that began yesterday.  Patient states that she developed left-sided abdominal pain that is located in the left upper and lower quadrants, initially was constant but has now become intermittent and is specific to any type of movement, palpation, or laughing.  No other alleviating or aggravating factors.  She has had some associated urinary frequency, she went to urgent care and had blood in her urine.  She denies fever, chills, nausea, vomiting, diarrhea, melena, hematochezia, chest pain, dyspnea, or syncope.  She states this feels somewhat like when she had her appendicitis  HPI     Past Medical History:  Diagnosis Date  . Depression   . Hypertension   . [name]     Patient Active Problem List   Diagnosis Date Noted  . Pure hypercholesterolemia 10/02/2019  . Prediabetes 10/02/2019  . Hypokalemia 03/29/2019  . Acute appendicitis 09/26/2018  . Obstructive sleep apnea treated with continuous positive airway pressure (CPAP) 08/01/2017  . Inadequate sleep hygiene 02/22/2017  . Excessive daytime sleepiness 02/22/2017  . Sleep deprivation 02/22/2017  . Snoring 02/22/2017  . OSA (obstructive sleep apnea) 02/22/2017  . HTN (hypertension) 02/23/2012  . Anxiety and depression 02/23/2012    Past Surgical History:  Procedure Laterality Date  . APPENDECTOMY  09/26/2018  . HYSTERECTOMY ABDOMINAL WITH SALPINGO-OOPHORECTOMY  2000   DUB  . JOINT REPLACEMENT    . LAPAROSCOPIC APPENDECTOMY N/A 09/26/2018   Procedure: APPENDECTOMY LAPAROSCOPIC;   Surgeon: Kinsinger, Arta Bruce, MD;  Location: [facility];  Service: General;  Laterality: N/A;  . SHOULDER SURGERY Left    01-02-2018, has had R shoulder rotator cuff previously     OB History   No obstetric history on file.     Family History  Problem Relation Age of Onset  . Hypertension Father   . Heart attack Father     Social History   Tobacco Use  . Smoking status: Former Smoker    Packs/day: 3.00    Years: 20.00    Pack years: 60.00    Quit date: 2000    Years since quitting: 22.0  . Smokeless tobacco: Never Used  Vaping Use  . Vaping Use: Never used  Substance Use Topics  . Alcohol use: No    Alcohol/week: 0.0 standard drinks  . Drug use: No    Home Medications Prior to Admission medications   Medication Sig Start Date End Date Taking? Authorizing Provider  aspirin 81 MG tablet You can restart your baby aspirin 1 daily 09/28/2018. 09/26/18   Earnstine Regal, PA-C  atorvastatin (LIPITOR) 20 MG tablet Take 1 tablet (20 mg total) by mouth daily. 10/02/19   [name], [name], MD  chlorthalidone (HYGROTON) 25 MG tablet Take 1 tablet (25 mg total) by mouth daily. 10/02/19   Daleen Squibb, MD  FLUoxetine (PROZAC) 20 MG capsule Take 3 capsules (60 mg total) by mouth daily. 10/02/19   [name], [name], MD  potassium chloride SA (KLOR-CON M20) 20 MEQ tablet Take 1 tablet (20 mEq total) by mouth daily. 10/02/19   Pamella Pert  Claudie Leach, MD    Allergies    Patient has no known allergies.  Review of Systems   Review of Systems  Constitutional: Negative for chills and fever.  Respiratory: Negative for shortness of breath.   Cardiovascular: Negative for chest pain.  Gastrointestinal: Positive for abdominal pain. Negative for blood in stool, constipation, diarrhea, nausea and vomiting.  Genitourinary: Positive for frequency. Negative for dysuria.  Neurological: Negative for syncope.  All other systems reviewed and are negative.   Physical Exam Updated Vital  Signs BP (!) 146/81 (BP Location: Left Arm)   Pulse 75   Temp 98.2 F (36.8 C) (Oral)   Resp 18   Ht 5\' 1"  (1.549 m)   Wt 74.4 kg   SpO2 99%   BMI 30.99 kg/m   Physical Exam Vitals and nursing note reviewed.  Constitutional:      General: She is not in acute distress.    Appearance: She is well-developed. She is not toxic-appearing.  HENT:     Head: Normocephalic and atraumatic.  Eyes:     General:        Right eye: No discharge.        Left eye: No discharge.     Conjunctiva/sclera: Conjunctivae normal.  Cardiovascular:     Rate and Rhythm: Normal rate and regular rhythm.  Pulmonary:     Effort: Pulmonary effort is normal. No respiratory distress.     Breath sounds: Normal breath sounds. No wheezing, rhonchi or rales.  Abdominal:     General: There is no distension.     Palpations: Abdomen is soft.     Tenderness: There is abdominal tenderness in the left upper quadrant and left lower quadrant. There is no right CVA tenderness, left CVA tenderness, guarding or rebound.  Musculoskeletal:     Cervical back: Neck supple.  Skin:    General: Skin is warm and dry.     Findings: No rash.  Neurological:     Mental Status: She is alert.     Comments: Clear speech.   Psychiatric:        Behavior: Behavior normal.     ED Results / Procedures / Treatments   Labs (all labs ordered are listed, but only abnormal results are displayed) Labs Reviewed  URINALYSIS, ROUTINE W REFLEX MICROSCOPIC - Abnormal; Notable for the following components:      Result Value   Hgb urine dipstick MODERATE (*)    Bacteria, UA RARE (*)    All other components within normal limits  COMPREHENSIVE METABOLIC PANEL  CBC WITH DIFFERENTIAL/PLATELET  LIPASE, BLOOD    EKG None  Radiology CT Abdomen Pelvis W Contrast  Result Date: 02/08/2020 CLINICAL DATA:  Left lower quadrant abdominal pain EXAM: CT ABDOMEN AND PELVIS WITH CONTRAST TECHNIQUE: Multidetector CT imaging of the abdomen and pelvis  was performed using the standard protocol following bolus administration of intravenous contrast. CONTRAST:  119mL OMNIPAQUE IOHEXOL 300 MG/ML  SOLN COMPARISON:  None. FINDINGS: Lower chest: The visualized heart size within normal limits. No pericardial fluid/thickening. No hiatal hernia. Streaky airspace opacity seen at both lung bases. Hepatobiliary: The liver is normal in density without focal abnormality.The main portal vein is patent. No evidence of calcified gallstones, gallbladder wall thickening or biliary dilatation. Pancreas: Unremarkable. No pancreatic ductal dilatation or surrounding inflammatory changes. Spleen: Normal in size without focal abnormality. Adrenals/Urinary Tract: Both adrenal glands appear normal. The kidneys and collecting system appear normal without evidence of urinary tract calculus or hydronephrosis. Bladder  is unremarkable. Stomach/Bowel: The stomach, small bowel are normal in appearance. There is a focal segment of distal descending colon measuring 6.5 cm with diverticula and surrounding fat stranding changes and small foci of free air. No loculated fluid collections are noted. Vascular/Lymphatic: There are no enlarged mesenteric, retroperitoneal, or pelvic lymph nodes. Scattered aortic atherosclerotic calcifications are seen without aneurysmal dilatation. Reproductive: The patient is status post hysterectomy. No adnexal masses or collections seen. Other: Small amount of pneumoperitoneum seen under the hemidiaphragm as well as within the anterior upper abdomen. Musculoskeletal: No acute or significant osseous findings. IMPRESSION: Perforated distal descending colon diverticulitis with surrounding inflammatory changes. No pericolonic abscess Small amount of pneumoperitoneum under the hemidiaphragms Aortic Atherosclerosis (ICD10-I70.0). Electronically Signed   By: Prudencio Pair M.D.   On: 02/08/2020 03:03    Procedures .Critical Care Performed by: Amaryllis Dyke,  PA-C Authorized by: Amaryllis Dyke, PA-C    CRITICAL CARE Performed by: Kennith Maes   Total critical care time: 30 minutes  Critical care time was exclusive of separately billable procedures and treating other patients.  Critical care was necessary to treat or prevent imminent or life-threatening deterioration.  Critical care was time spent personally by me on the following activities: development of treatment plan with patient and/or surrogate as well as nursing, discussions with consultants, evaluation of patient's response to treatment, examination of patient, obtaining history from patient or surrogate, ordering and performing treatments and interventions, ordering and review of laboratory studies, ordering and review of radiographic studies, pulse oximetry and re-evaluation of patient's condition.  (including critical care time)  Medications Ordered in ED Medications  morphine 4 MG/ML injection 4 mg (has no administration in time range)  ondansetron (ZOFRAN) injection 4 mg (has no administration in time range)    ED Course  I have reviewed the triage vital signs and the nursing notes.  Pertinent labs & imaging results that were available during my care of the patient were reviewed by me and considered in my medical decision making (see chart for details).    MDM Rules/Calculators/A&P                         Patient presents to the ED with complaints of abdominal pain.  Nontoxic, BP elevated initially now improved.  LUQ/LLQ abdominal tenderness.   DDX: Diverticulitis, perf, obstruction, nephrolithiasis, pyelonephritis, MSK, pancreatitis.   Additional history obtained:  Additional history obtained from chart review & nursing note review.   Lab Tests:  I Ordered, reviewed, and interpreted labs, which included:  CBC: Mild leukocytosis.  CMP: Hypokalemia @ 2.5 Lipase: WNL UA: moderate hgb.   Imaging Studies ordered:  I ordered imaging studies which  included CT A/P, I independently visualized and interpreted imaging which showed Perforated distal descending colon diverticulitis with surrounding inflammatory changes. No pericolonic abscess Small amount of pneumoperitoneum under the hemidiaphragms Aortic Atherosclerosis  ED Course:  Hypokalemia- IV replacement ordered. EKG w/ QTc 427. Will check mag.  Diverticulitis- plan to discuss w/ general surgery, anticipate abx & admit to medicine.  03:54: CONSULT: Discussed with general surgeon Dr. Georgette Dover- recommends zosyn, NPO, general surgery will see in consultation in the morning.  04:17: CONSULT: Discussed case with hospitalist Dr. Marlowe Sax- accepts admission.   Findings and plan of care discussed with supervising physician Dr. Betsey Holiday who is in agreement.   Portions of this note were generated with Lobby[employer]. Dictation errors may occur despite best attempts at proofreading.  Final Clinical Impression(s) /  ED Diagnoses Final diagnoses:  Diverticulitis of colon with perforation  Hypokalemia    Rx / DC Orders ED Discharge Orders    None       Amaryllis Dyke, PA-C 02/08/20 [id number]    Orpah Greek, MD 02/09/20 [phone]

## 2020-02-08 NOTE — Plan of Care (Signed)
Patient admitted to [facility] 08. Alert and oriented x4. No distress noted. Admission assessment completed. Will continue to monitor according to orders.   Problem: Education: Goal: Knowledge of General Education information will improve Description: Including pain rating scale, medication(s)/side effects and non-pharmacologic comfort measures Outcome: Progressing   Problem: Health Behavior/Discharge Planning: Goal: Ability to manage health-related needs will improve Outcome: Progressing   Problem: Clinical Measurements: Goal: Ability to maintain clinical measurements within normal limits will improve Outcome: Progressing Goal: Will remain free from infection Outcome: Progressing Goal: Diagnostic test results will improve Outcome: Progressing Goal: Respiratory complications will improve Outcome: Progressing Goal: Cardiovascular complication will be avoided Outcome: Progressing   Problem: Activity: Goal: Risk for activity intolerance will decrease Outcome: Progressing   Problem: Nutrition: Goal: Adequate nutrition will be maintained Outcome: Progressing   Problem: Coping: Goal: Level of anxiety will decrease Outcome: Progressing   Problem: Elimination: Goal: Will not experience complications related to bowel motility Outcome: Progressing Goal: Will not experience complications related to urinary retention Outcome: Progressing   Problem: Pain Managment: Goal: General experience of comfort will improve Outcome: Progressing   Problem: Safety: Goal: Ability to remain free from injury will improve Outcome: Progressing   Problem: Skin Integrity: Goal: Risk for impaired skin integrity will decrease Outcome: Progressing

## 2020-02-08 NOTE — Progress Notes (Signed)
Pharmacy Antibiotic Note  April Stevens is a 65 y.o. female admitted on 02/07/2020 with intra-abd infection.  Pharmacy has been consulted for Zosyn dosing.  Plan: Zosyn 3.375gm IV q8h EI Will f/u renal function, micro data, and pt's clinical condition  Height: 5\' 1"  (154.9 cm) Weight: 74.4 kg (164 lb) IBW/kg (Calculated) : 47.8  Temp (24hrs), Avg:99 F (37.2 C), Min:98.2 F (36.8 C), Max:99.8 F (37.7 C)  Recent Labs  Lab 02/08/20 0049  WBC 10.6*  CREATININE 0.83    Estimated Creatinine Clearance: 61.5 mL/min (by C-G formula based on SCr of 0.83 mg/dL).    No Known Allergies  Antimicrobials this admission: 1/21 Zosyn >>    Thank you for allowing pharmacy to be a part of this patient's care.  Sherlon Handing, PharmD, BCPS Please see amion for complete clinical pharmacist phone list 02/08/2020 4:44 AM

## 2020-02-09 LAB — CBC WITH DIFFERENTIAL/PLATELET
Abs Immature Granulocytes: 0.02 10*3/uL (ref 0.00–0.07)
Basophils Absolute: 0 10*3/uL (ref 0.0–0.1)
Basophils Relative: 0 %
Eosinophils Absolute: 0.2 10*3/uL (ref 0.0–0.5)
Eosinophils Relative: 2 %
HCT: 32.7 % — ABNORMAL LOW (ref 36.0–46.0)
Hemoglobin: 11.7 g/dL — ABNORMAL LOW (ref 12.0–15.0)
Immature Granulocytes: 0 %
Lymphocytes Relative: 25 %
Lymphs Abs: 2.1 10*3/uL (ref 0.7–4.0)
MCH: 30.8 pg (ref 26.0–34.0)
MCHC: 35.8 g/dL (ref 30.0–36.0)
MCV: 86.1 fL (ref 80.0–100.0)
Monocytes Absolute: 0.6 10*3/uL (ref 0.1–1.0)
Monocytes Relative: 7 %
Neutro Abs: 5.6 10*3/uL (ref 1.7–7.7)
Neutrophils Relative %: 66 %
Platelets: 265 10*3/uL (ref 150–400)
RBC: 3.8 MIL/uL — ABNORMAL LOW (ref 3.87–5.11)
RDW: 13.3 % (ref 11.5–15.5)
WBC: 8.6 10*3/uL (ref 4.0–10.5)
nRBC: 0 % (ref 0.0–0.2)

## 2020-02-09 LAB — GLUCOSE, CAPILLARY
Glucose-Capillary: 100 mg/dL — ABNORMAL HIGH (ref 70–99)
Glucose-Capillary: 101 mg/dL — ABNORMAL HIGH (ref 70–99)
Glucose-Capillary: 114 mg/dL — ABNORMAL HIGH (ref 70–99)

## 2020-02-09 LAB — BASIC METABOLIC PANEL
Anion gap: 11 (ref 5–15)
BUN: 7 mg/dL — ABNORMAL LOW (ref 8–23)
CO2: 24 mmol/L (ref 22–32)
Calcium: 8.2 mg/dL — ABNORMAL LOW (ref 8.9–10.3)
Chloride: 104 mmol/L (ref 98–111)
Creatinine, Ser: 0.85 mg/dL (ref 0.44–1.00)
GFR, Estimated: >60 mL/min (ref >60)
Glucose, Bld: 100 mg/dL — ABNORMAL HIGH (ref 70–99)
Potassium: 3.1 mmol/L — ABNORMAL LOW (ref 3.5–5.1)
Sodium: 139 mmol/L (ref 135–145)

## 2020-02-09 LAB — MAGNESIUM: Magnesium: 2 mg/dL (ref 1.7–2.4)

## 2020-02-09 MED ORDER — POTASSIUM CHLORIDE CRYS ER 20 MEQ PO TBCR
40.0000 meq | EXTENDED_RELEASE_TABLET | Freq: Two times a day (BID) | ORAL | Status: AC
  Administered 2020-02-09 (×2): 40 meq via ORAL
  Filled 2020-02-09 (×2): qty 2

## 2020-02-09 MED ORDER — VITAMIN B-12 1000 MCG PO TABS
1000.0000 ug | ORAL_TABLET | Freq: Every day | ORAL | Status: DC
  Administered 2020-02-09 – 2020-02-10 (×2): 1000 ug via ORAL
  Filled 2020-02-09 (×2): qty 1

## 2020-02-09 MED ORDER — POTASSIUM CHLORIDE CRYS ER 20 MEQ PO TBCR
20.0000 meq | EXTENDED_RELEASE_TABLET | Freq: Every day | ORAL | Status: DC
  Administered 2020-02-10: 20 meq via ORAL
  Filled 2020-02-09: qty 1

## 2020-02-09 MED ORDER — ENOXAPARIN SODIUM 40 MG/0.4ML ~~LOC~~ SOLN
40.0000 mg | SUBCUTANEOUS | Status: DC
  Administered 2020-02-09 – 2020-02-11 (×3): 40 mg via SUBCUTANEOUS
  Filled 2020-02-09 (×3): qty 0.4

## 2020-02-09 MED ORDER — POTASSIUM CHLORIDE 10 MEQ/100ML IV SOLN
10.0000 meq | INTRAVENOUS | Status: DC
  Administered 2020-02-09 (×2): 10 meq via INTRAVENOUS
  Filled 2020-02-09 (×2): qty 100

## 2020-02-09 MED ORDER — FLUOXETINE HCL 20 MG PO CAPS
60.0000 mg | ORAL_CAPSULE | Freq: Every day | ORAL | Status: DC
  Administered 2020-02-09 – 2020-02-11 (×3): 60 mg via ORAL
  Filled 2020-02-09 (×3): qty 3

## 2020-02-09 NOTE — Progress Notes (Signed)
Patient ID: April Stevens, female   DOB: [birth year], 65 y.o.   MRN: [id number] [facility] Surgery Progress Note:   * No surgery found *  Subjective: Mental status is clear.  Complaints she is feeling better and wants to eat!. Objective: Vital signs in last 24 hours: Temp:  [99.7 F (37.6 C)-100.3 F (37.9 C)] 99.7 F (37.6 C) (01/22 0213) Pulse Rate:  [66-88] 75 (01/22 0213) Resp:  [11-23] 15 (01/22 0213) BP: (112-152)/(53-79) 145/72 (01/22 0213) SpO2:  [88 %-98 %] 98 % (01/22 0213)  Intake/Output from previous day: 01/21 0701 - 01/22 0700 In: 315.3 [I.V.:4.8; IV Piggyback:310.5] Out: -  Intake/Output this shift: No intake/output data recorded.  Physical Exam: Work of breathing is normal.  Pain on the left side is better  Lab Results:  Results for orders placed or performed during the hospital encounter of 02/07/20 (from the past 48 hour(s))  Urinalysis, Routine w reflex microscopic Urine, Clean Catch     Status: Abnormal   Collection Time: 02/07/20  4:40 PM  Result Value Ref Range   Color, Urine YELLOW YELLOW   APPearance CLEAR CLEAR   Specific Gravity, Urine 1.018 1.005 - 1.030   pH 6.0 5.0 - 8.0   Glucose, UA NEGATIVE NEGATIVE mg/dL   Hgb urine dipstick MODERATE (A) NEGATIVE   Bilirubin Urine NEGATIVE NEGATIVE   Ketones, ur NEGATIVE NEGATIVE mg/dL   Protein, ur NEGATIVE NEGATIVE mg/dL   Nitrite NEGATIVE NEGATIVE   Leukocytes,Ua NEGATIVE NEGATIVE   RBC / HPF 6-10 0 - 5 RBC/hpf   WBC, UA 0-5 0 - 5 WBC/hpf   Bacteria, UA RARE (A) NONE SEEN   Squamous Epithelial / LPF 0-5 0 - 5   Mucus PRESENT     Comment: Performed at [facility], [address] [place], [place] [address]  Comprehensive metabolic panel     Status: Abnormal   Collection Time: 02/08/20 12:49 AM  Result Value Ref Range   Sodium 136 135 - 145 mmol/L   Potassium 2.5 (LL) 3.5 - 5.1 mmol/L    Comment: CRITICAL RESULT CALLED TO, READ BACK BY AND VERIFIED WITH: Marylen Ponto RN [id number] M  GARRETT    Chloride 99 98 - 111 mmol/L   CO2 25 22 - 32 mmol/L   Glucose, Bld 176 (H) 70 - 99 mg/dL    Comment: Glucose reference range applies only to samples taken after fasting for at least 8 hours.   BUN 11 8 - 23 mg/dL   Creatinine, Ser 0.83 0.44 - 1.00 mg/dL   Calcium 9.1 8.9 - 10.3 mg/dL   Total Protein 7.0 6.5 - 8.1 g/dL   Albumin 3.7 3.5 - 5.0 g/dL   AST 19 15 - 41 U/L   ALT 16 0 - 44 U/L   Alkaline Phosphatase 88 38 - 126 U/L   Total Bilirubin 1.2 0.3 - 1.2 mg/dL   GFR, Estimated >60 >60 mL/min    Comment: (NOTE) Calculated using the CKD-EPI Creatinine Equation (2021)    Anion gap 12 5 - 15    Comment: Performed at [facility] [address], [place],  [address]  CBC with Differential     Status: Abnormal   Collection Time: 02/08/20 12:49 AM  Result Value Ref Range   WBC 10.6 (H) 4.0 - 10.5 K/uL   RBC 4.20 3.87 - 5.11 MIL/uL   Hemoglobin 12.9 12.0 - 15.0 g/dL   HCT 35.6 (L) 36.0 - 46.0 %  MCV 84.8 80.0 - 100.0 fL   MCH 30.7 26.0 - 34.0 pg   MCHC 36.2 (H) 30.0 - 36.0 g/dL   RDW 13.4 11.5 - 15.5 %   Platelets 292 150 - 400 K/uL   nRBC 0.0 0.0 - 0.2 %   Neutrophils Relative % 63 %   Neutro Abs 6.7 1.7 - 7.7 K/uL   Lymphocytes Relative 28 %   Lymphs Abs 2.9 0.7 - 4.0 K/uL   Monocytes Relative 7 %   Monocytes Absolute 0.8 0.1 - 1.0 K/uL   Eosinophils Relative 2 %   Eosinophils Absolute 0.2 0.0 - 0.5 K/uL   Basophils Relative 0 %   Basophils Absolute 0.0 0.0 - 0.1 K/uL   Immature Granulocytes 0 %   Abs Immature Granulocytes 0.03 0.00 - 0.07 K/uL    Comment: Performed at [facility] [address] [place], [place] [address]  Lipase, blood     Status: None   Collection Time: 02/08/20 12:49 AM  Result Value Ref Range   Lipase 31 11 - 51 U/L    Comment: Performed at [facility] [address], [place], [place] [address]  Magnesium     Status: None   Collection Time: 02/08/20  1:05 AM  Result Value Ref Range   Magnesium 1.7 1.7 -  2.4 mg/dL    Comment: Performed at [facility] [address] [place], [place] [address]  SARS Coronavirus 2 by RT PCR (hospital order, performed in [facility] hospital lab) Nasopharyngeal Nasopharyngeal Swab     Status: None   Collection Time: 02/08/20  4:25 AM   Specimen: Nasopharyngeal Swab  Result Value Ref Range   SARS Coronavirus 2 NEGATIVE NEGATIVE    Comment: (NOTE) SARS-CoV-2 target nucleic acids are NOT DETECTED.  The SARS-CoV-2 RNA is generally detectable in upper and lower respiratory specimens during the acute phase of infection. The lowest concentration of SARS-CoV-2 viral copies this assay can detect is 250 copies / mL. A negative result does not preclude SARS-CoV-2 infection and should not be used as the sole basis for treatment or other patient management decisions.  A negative result may occur with improper specimen collection / handling, submission of specimen other than nasopharyngeal swab, presence of viral mutation(s) within the areas targeted by this assay, and inadequate number of viral copies (<250 copies / mL). A negative result must be combined with clinical observations, patient history, and epidemiological information.  Fact Sheet for Patients:   StrictlyIdeas.no  Fact Sheet for Healthcare Providers: BankingDealers.co.za  This test is not yet approved or  cleared by the [place] FDA and has been authorized for detection and/or diagnosis of SARS-CoV-2 by FDA under an Emergency Use Authorization (EUA).  This EUA will remain in effect (meaning this test can be used) for the duration of the COVID-19 declaration under Section 564(b)(1) of the Act, 21 U.S.C. section 360bbb-3(b)(1), unless the authorization is terminated or revoked sooner.  Performed at [facility], [place] [address], [place], [place] [address]   HIV Antibody (routine testing w rflx)     Status: None   Collection Time:  02/08/20  4:36 AM  Result Value Ref Range   HIV Screen 4th Generation wRfx Non Reactive Non Reactive    Comment: Performed at [facility], [place] [address] [place] [address], [place], [place] [address]  Troponin I (High Sensitivity)     Status: None   Collection Time: 02/08/20  4:36 AM  Result  Value Ref Range   Troponin I (High Sensitivity) 6 <18 ng/L    Comment: (NOTE) Elevated high sensitivity troponin I (hsTnI) values and significant  changes across serial measurements may suggest ACS but many other  chronic and acute conditions are known to elevate hsTnI results.  Refer to the "Links" section for chest pain algorithms and additional  guidance. Performed at [facility], [place] [address], [place], [place] [address]   Glucose, capillary     Status: Abnormal   Collection Time: 02/08/20 11:07 PM  Result Value Ref Range   Glucose-Capillary 105 (H) 70 - 99 mg/dL    Comment: Glucose reference range applies only to samples taken after fasting for at least 8 hours.  Basic metabolic panel     Status: Abnormal   Collection Time: 02/09/20 12:49 AM  Result Value Ref Range   Sodium 139 135 - 145 mmol/L   Potassium 3.1 (L) 3.5 - 5.1 mmol/L   Chloride 104 98 - 111 mmol/L   CO2 24 22 - 32 mmol/L   Glucose, Bld 100 (H) 70 - 99 mg/dL    Comment: Glucose reference range applies only to samples taken after fasting for at least 8 hours.   BUN 7 (L) 8 - 23 mg/dL   Creatinine, Ser 0.85 0.44 - 1.00 mg/dL   Calcium 8.2 (L) 8.9 - 10.3 mg/dL   GFR, Estimated >60 >60 mL/min    Comment: (NOTE) Calculated using the CKD-EPI Creatinine Equation (2021)    Anion gap 11 5 - 15    Comment: Performed at [facility] [address], [place], [place] [address]  Magnesium     Status: None   Collection Time: 02/09/20 12:49 AM  Result Value Ref Range   Magnesium 2.0 1.7 - 2.4 mg/dL    Comment: Performed at [facility] [address], [place], [place] [address]  CBC with Differential/Platelet     Status:  Abnormal   Collection Time: 02/09/20 12:49 AM  Result Value Ref Range   WBC 8.6 4.0 - 10.5 K/uL   RBC 3.80 (L) 3.87 - 5.11 MIL/uL   Hemoglobin 11.7 (L) 12.0 - 15.0 g/dL   HCT 32.7 (L) 36.0 - 46.0 %   MCV 86.1 80.0 - 100.0 fL   MCH 30.8 26.0 - 34.0 pg   MCHC 35.8 30.0 - 36.0 g/dL   RDW 13.3 11.5 - 15.5 %   Platelets 265 150 - 400 K/uL   nRBC 0.0 0.0 - 0.2 %   Neutrophils Relative % 66 %   Neutro Abs 5.6 1.7 - 7.7 K/uL   Lymphocytes Relative 25 %   Lymphs Abs 2.1 0.7 - 4.0 K/uL   Monocytes Relative 7 %   Monocytes Absolute 0.6 0.1 - 1.0 K/uL   Eosinophils Relative 2 %   Eosinophils Absolute 0.2 0.0 - 0.5 K/uL   Basophils Relative 0 %   Basophils Absolute 0.0 0.0 - 0.1 K/uL   Immature Granulocytes 0 %   Abs Immature Granulocytes 0.02 0.00 - 0.07 K/uL    Comment: Performed at [facility] [address], [place], [place] [address]    Radiology/Results: CT Abdomen Pelvis W Contrast  Result Date: 02/08/2020 CLINICAL DATA:  Left lower quadrant abdominal pain EXAM: CT ABDOMEN AND PELVIS WITH CONTRAST TECHNIQUE: Multidetector CT imaging of the abdomen and pelvis was performed using the standard protocol following bolus administration of intravenous contrast. CONTRAST:  161mL OMNIPAQUE IOHEXOL 300 MG/ML  SOLN COMPARISON:  None. FINDINGS: Lower chest: The visualized heart size within normal limits. No pericardial fluid/thickening. No hiatal hernia. Streaky airspace opacity seen at both lung bases. Hepatobiliary: The liver is normal in density without focal abnormality.The main portal vein is patent. No evidence of calcified gallstones, gallbladder wall thickening or biliary dilatation. Pancreas: Unremarkable. No pancreatic ductal dilatation or surrounding inflammatory changes. Spleen: Normal in size without focal abnormality. Adrenals/Urinary Tract: Both adrenal glands appear normal. The kidneys and collecting system appear normal without evidence of urinary tract calculus or  hydronephrosis. Bladder is unremarkable. Stomach/Bowel: The stomach, small bowel are normal in appearance. There is a focal segment of distal descending colon measuring 6.5 cm with diverticula and surrounding fat stranding changes and small foci of free air. No loculated fluid collections are noted. Vascular/Lymphatic: There are no enlarged mesenteric, retroperitoneal, or pelvic lymph nodes. Scattered aortic atherosclerotic calcifications are seen without aneurysmal dilatation. Reproductive: The patient is status post hysterectomy. No adnexal masses or collections seen. Other: Small amount of pneumoperitoneum seen under the hemidiaphragm as well as within the anterior upper abdomen. Musculoskeletal: No acute or significant osseous findings. IMPRESSION: Perforated distal descending colon diverticulitis with surrounding inflammatory changes. No pericolonic abscess Small amount of pneumoperitoneum under the hemidiaphragms Aortic Atherosclerosis (ICD10-I70.0). Electronically Signed   By: Prudencio Pair M.D.   On: 02/08/2020 03:03    Anti-infectives: Anti-infectives (From admission, onward)   Start     Dose/Rate Route Frequency Ordered Stop   02/08/20 1200  piperacillin-tazobactam (ZOSYN) IVPB 3.375 g        3.375 g 12.5 mL/hr over 240 Minutes Intravenous Every 8 hours 02/08/20 0447     02/08/20 0400  piperacillin-tazobactam (ZOSYN) IVPB 3.375 g        3.375 g 100 mL/hr over 30 Minutes Intravenous  Once 02/08/20 0357 02/08/20 0510      Assessment/Plan: Problem List: Patient Active Problem List   Diagnosis Date Noted  . Perforated diverticulum 02/08/2020  . Pneumoperitoneum 02/08/2020  . HLD (hyperlipidemia) 02/08/2020  . Pure hypercholesterolemia 10/02/2019  . Prediabetes 10/02/2019  . Hypokalemia 03/29/2019  . Acute appendicitis 09/26/2018  . Obstructive sleep apnea treated with continuous positive airway pressure (CPAP) 08/01/2017  . Inadequate sleep hygiene 02/22/2017  . Excessive daytime  sleepiness 02/22/2017  . Sleep deprivation 02/22/2017  . Snoring 02/22/2017  . OSA (obstructive sleep apnea) 02/22/2017  . HTN (hypertension) 02/23/2012  . Anxiety and depression 02/23/2012    Improved pain and I cautioned her about trying to eat too quickly.  Would keep on liquids for now and let antibiotic treat the infection.   * No surgery found *    LOS: 1 day   Matt B. Hassell Done, MD, [facility] [phone] to reach the surgeon on call.    02/09/2020 9:13 AM

## 2020-02-09 NOTE — Progress Notes (Signed)
PROGRESS NOTE  April Stevens [id number] DOB: 1955/05/14 DOA: 02/07/2020 PCP: [name], [name], MD  HPI/Recap of past 24 hours: April Jordan Odomis a 65 y.o.femalewith medical history significant ofhypertension, depression, [name], surgical history of appendectomy and hysterectomy with salpingo-oophorectomy presentingwith complaint of left-sided abdominal pain.Patient reports 1 day history of progressively worsening sharp left lower quadrant abdominal pain. Denies flank pain. Denies nausea or vomiting. The pain was 9 out of 10 in intensity before she received pain medications. She had a regular bowel movement the day prior to presentation, no bloody stools. She was febrile at home when her symptoms started and fever resolved after she took ibuprofen. Patient has no other complaints. Denies cough, shortness of breath, or chest pain. She is not vaccinated against COVID.  COVID-19 screening test negative on 02/08/2020.  ED Course:Blood pressure elevated on arrival, remainder of vital signs stable. WBC 10.6, hemoglobin 12.9, platelet count 292K.Sodium 136, potassium 2.5, chloride 99, bicarb 25, BUN 11, creatinine 0.8, glucose 176. Magnesium 1.7. No gross hematuria, urine microscopic examination showing 6-10 RBCs/hpf. CT showing perforated distal descending colon diverticulitis with surrounding inflammatory changes. No pericolonic abscess. Small amount of pneumoperitoneum under the hemidiaphragms. Patient was given morphine, Zofran, Zosyn, and potassium supplementation. ED provider discussed the case with Dr. Georgette Dover from general surgery who recommended continuing antibiotic therapy and surgery team will see the patient in consultation.  Marked electrolytes derangement that are being repleted.  Pain control in place as needed.  CPAP QHS.  02/09/20: Patient was seen and examined at bedside.  Reports her left lower quadrant abdominal pain is improved.  She denies any nausea.  She  wants to eat.  She was seen by general surgery.  No plan for surgical intervention.  On broad-spectrum IV antibiotics empirically, Zosyn.  Started on clear liquid diet, will continue to closely monitor.  Assessment/Plan: Principal Problem:   Perforated diverticulum Active Problems:   HTN (hypertension)   Hypokalemia   Pneumoperitoneum   HLD (hyperlipidemia)  Perforated distal descending colon diverticulitis, POA. Presented with left lower quadrant abdominal pain of 2 days duration, associated with fever at home. CT scan evidence of distal descending colon diverticulitis with perforation. Started on IV antibiotics empirically, Zosyn. General surgery consulted and following. Blood cultures negative to date.  Continue to follow cultures. Her pain is improved, continue pain control as needed  Small amount of pneumoperitoneum under the hemidiaphragms in the setting of perforated diverticulitis. Continue to monitor.  Refractory hypokalemia, chronic She was on oral potassium replacement prior to admission Presented with serum potassium of 2.5 Serum potassium 3.1 post repletion Repleted orally, recheck. Serum magnesium 2.0.  Chronic anxiety/depression Stable Resume home Prozac.  Resolved hypomagnesemia Repleted intravenously Serum magnesium 2.0.   Code Status: Full code  Family Communication: None at bedside  Disposition Plan: Likely to home when general surgery consult.   Consultants:  General surgery  Procedures:  None.  Antimicrobials:  Zosyn.  DVT prophylaxis: Subcu Lovenox daily  Status is: Inpatient    Dispo:  Patient From: Home  Planned Disposition: Home  Expected discharge date: 02/11/2020  Medically stable for discharge: No, ongoing management of perforated diverticulitis.          Objective: Vitals:   02/08/20 1830 02/08/20 1845 02/08/20 1934 02/09/20 0213  BP: (!) 149/74 (!) 141/75 (!) 152/71 (!) 145/72  Pulse: 72 75 78 75  Resp: 15  15  15   Temp:   100.3 F (37.9 C) 99.7 F (37.6 C)  TempSrc:  Oral Oral  SpO2: 93% 94% 98% 98%  Weight:      Height:        Intake/Output Summary (Last 24 hours) at 02/09/2020 1017 Last data filed at 02/08/2020 2322 Gross per 24 hour  Intake 315.29 ml  Output -  Net 315.29 ml   Filed Weights   02/07/20 1641  Weight: 74.4 kg    Exam:  . General: 65 y.o. year-old female well developed well nourished in no acute distress.  Alert and oriented x3. . Cardiovascular: Regular rate and rhythm with no rubs or gallops.  No thyromegaly or JVD noted.   [name] Respiratory: Clear to auscultation with no wheezes or rales. Good inspiratory effort. . Abdomen: Soft minimal tenderness left lower quadrant.  Nondistended with normal bowel sounds x4 quadrants. . Musculoskeletal: No lower extremity edema. 2/4 pulses in all 4 extremities. . Skin: No ulcerative lesions noted or rashes . Psychiatry: Mood is appropriate for condition and setting   Data Reviewed: CBC: Recent Labs  Lab 02/08/20 0049 02/09/20 0049  WBC 10.6* 8.6  NEUTROABS 6.7 5.6  HGB 12.9 11.7*  HCT 35.6* 32.7*  MCV 84.8 86.1  PLT 292 073   Basic Metabolic Panel: Recent Labs  Lab 02/08/20 0049 02/08/20 0105 02/09/20 0049  NA 136  --  139  K 2.5*  --  3.1*  CL 99  --  104  CO2 25  --  24  GLUCOSE 176*  --  100*  BUN 11  --  7*  CREATININE 0.83  --  0.85  CALCIUM 9.1  --  8.2*  MG  --  1.7 2.0   GFR: Estimated Creatinine Clearance: 60 mL/min (by C-G formula based on SCr of 0.85 mg/dL). Liver Function Tests: Recent Labs  Lab 02/08/20 0049  AST 19  ALT 16  ALKPHOS 88  BILITOT 1.2  PROT 7.0  ALBUMIN 3.7   Recent Labs  Lab 02/08/20 0049  LIPASE 31   No results for input(s): AMMONIA in the last 168 hours. Coagulation Profile: No results for input(s): INR, PROTIME in the last 168 hours. Cardiac Enzymes: No results for input(s): CKTOTAL, CKMB, CKMBINDEX, TROPONINI in the last 168 hours. BNP (last 3  results) No results for input(s): PROBNP in the last 8760 hours. HbA1C: No results for input(s): HGBA1C in the last 72 hours. CBG: Recent Labs  Lab 02/08/20 2307  GLUCAP 105*   Lipid Profile: No results for input(s): CHOL, HDL, LDLCALC, TRIG, CHOLHDL, LDLDIRECT in the last 72 hours. Thyroid Function Tests: No results for input(s): TSH, T4TOTAL, FREET4, T3FREE, THYROIDAB in the last 72 hours. Anemia Panel: No results for input(s): VITAMINB12, FOLATE, FERRITIN, TIBC, IRON, RETICCTPCT in the last 72 hours. Urine analysis:    Component Value Date/Time   COLORURINE YELLOW 02/07/2020 1640   APPEARANCEUR CLEAR 02/07/2020 1640   LABSPEC 1.018 02/07/2020 1640   PHURINE 6.0 02/07/2020 1640   GLUCOSEU NEGATIVE 02/07/2020 1640   HGBUR MODERATE (A) 02/07/2020 1640   BILIRUBINUR NEGATIVE 02/07/2020 1640   KETONESUR NEGATIVE 02/07/2020 1640   PROTEINUR NEGATIVE 02/07/2020 1640   UROBILINOGEN 1.0 10/08/2014 1903   NITRITE NEGATIVE 02/07/2020 1640   LEUKOCYTESUR NEGATIVE 02/07/2020 1640   Sepsis Labs: @LABRCNTIP (procalcitonin:4,lacticidven:4)  ) Recent Results (from the past 240 hour(s))  SARS Coronavirus 2 by RT PCR (hospital order, performed in [facility] hospital lab) Nasopharyngeal Nasopharyngeal Swab     Status: None   Collection Time: 02/08/20  4:25 AM   Specimen: Nasopharyngeal Swab  Result Value Ref  Range Status   SARS Coronavirus 2 NEGATIVE NEGATIVE Final    Comment: (NOTE) SARS-CoV-2 target nucleic acids are NOT DETECTED.  The SARS-CoV-2 RNA is generally detectable in upper and lower respiratory specimens during the acute phase of infection. The lowest concentration of SARS-CoV-2 viral copies this assay can detect is 250 copies / mL. A negative result does not preclude SARS-CoV-2 infection and should not be used as the sole basis for treatment or other patient management decisions.  A negative result may occur with improper specimen collection / handling, submission of  specimen other than nasopharyngeal swab, presence of viral mutation(s) within the areas targeted by this assay, and inadequate number of viral copies (<250 copies / mL). A negative result must be combined with clinical observations, patient history, and epidemiological information.  Fact Sheet for Patients:   StrictlyIdeas.no  Fact Sheet for Healthcare Providers: BankingDealers.co.za  This test is not yet approved or  cleared by the [place] FDA and has been authorized for detection and/or diagnosis of SARS-CoV-2 by FDA under an Emergency Use Authorization (EUA).  This EUA will remain in effect (meaning this test can be used) for the duration of the COVID-19 declaration under Section 564(b)(1) of the Act, 21 U.S.C. section 360bbb-3(b)(1), unless the authorization is terminated or revoked sooner.  Performed at [facility], [place] [address], [place],  [address]       Studies: No results found.  Scheduled Meds: . potassium chloride  40 mEq Oral BID    Continuous Infusions: . dextrose 5% lactated ringers with KCl 20 mEq/L Stopped (02/08/20 2239)  . piperacillin-tazobactam (ZOSYN)  IV 3.375 g (02/09/20 0515)     LOS: 1 day     Kayleen Memos, MD [facility] Pager [phone]  If 7PM-7AM, please contact night-coverage [email or web address] Password [facility] 02/09/2020, 10:17 AM

## 2020-02-09 NOTE — Progress Notes (Signed)
Pt doesn't want to wear CPAP 

## 2020-02-10 ENCOUNTER — Inpatient Hospital Stay (HOSPITAL_COMMUNITY): Payer: PPO

## 2020-02-10 LAB — BASIC METABOLIC PANEL
Anion gap: 10 (ref 5–15)
BUN: 5 mg/dL — ABNORMAL LOW (ref 8–23)
CO2: 23 mmol/L (ref 22–32)
Calcium: 8.8 mg/dL — ABNORMAL LOW (ref 8.9–10.3)
Chloride: 106 mmol/L (ref 98–111)
Creatinine, Ser: 0.87 mg/dL (ref 0.44–1.00)
GFR, Estimated: >60 mL/min (ref >60)
Glucose, Bld: 133 mg/dL — ABNORMAL HIGH (ref 70–99)
Potassium: 4.1 mmol/L (ref 3.5–5.1)
Sodium: 139 mmol/L (ref 135–145)

## 2020-02-10 LAB — CBC
HCT: 32.4 % — ABNORMAL LOW (ref 36.0–46.0)
Hemoglobin: 11.6 g/dL — ABNORMAL LOW (ref 12.0–15.0)
MCH: 30.8 pg (ref 26.0–34.0)
MCHC: 35.8 g/dL (ref 30.0–36.0)
MCV: 85.9 fL (ref 80.0–100.0)
Platelets: 284 10*3/uL (ref 150–400)
RBC: 3.77 MIL/uL — ABNORMAL LOW (ref 3.87–5.11)
RDW: 13 % (ref 11.5–15.5)
WBC: 7.2 10*3/uL (ref 4.0–10.5)
nRBC: 0 % (ref 0.0–0.2)

## 2020-02-10 LAB — GLUCOSE, CAPILLARY
Glucose-Capillary: 108 mg/dL — ABNORMAL HIGH (ref 70–99)
Glucose-Capillary: 165 mg/dL — ABNORMAL HIGH (ref 70–99)
Glucose-Capillary: 190 mg/dL — ABNORMAL HIGH (ref 70–99)
Glucose-Capillary: 88 mg/dL (ref 70–99)

## 2020-02-10 LAB — MAGNESIUM: Magnesium: 1.8 mg/dL (ref 1.7–2.4)

## 2020-02-10 MED ORDER — IOHEXOL 9 MG/ML PO SOLN
ORAL | Status: AC
  Administered 2020-02-10: 500 mL
  Filled 2020-02-10: qty 1000

## 2020-02-10 MED ORDER — IOHEXOL 9 MG/ML PO SOLN
500.0000 mL | ORAL | Status: AC
  Administered 2020-02-10 (×2): 500 mL via ORAL

## 2020-02-10 MED ORDER — IOHEXOL 300 MG/ML  SOLN
100.0000 mL | Freq: Once | INTRAMUSCULAR | Status: AC | PRN
  Administered 2020-02-10: 100 mL via INTRAVENOUS

## 2020-02-10 MED ORDER — SODIUM CHLORIDE 0.9 % IV SOLN: INTRAVENOUS | Status: DC

## 2020-02-10 NOTE — Progress Notes (Signed)
PROGRESS NOTE  April Stevens:[id number] DOB: 09-26-1955 DOA: 02/07/2020 PCP: April Stevens, April Argue, MD  HPI/Recap of past 24 hours: April Jordan Odomis a 65 y.o.femalewith medical history significant ofhypertension, depression, [name], surgical history of appendectomy and hysterectomy with salpingo-oophorectomy presentingwith complaint of left-sided abdominal pain.Patient reports 1 day history of progressively worsening sharp left lower quadrant abdominal pain. Denies flank pain. Denies nausea or vomiting. The pain was 9 out of 10 in intensity before she received pain medications. She had a regular bowel movement the day prior to presentation, no bloody stools. She was febrile at home when her symptoms started and fever resolved after she took ibuprofen. Patient has no other complaints. Denies cough, shortness of breath, or chest pain. She is not vaccinated against COVID.  COVID-19 screening test negative on 02/08/2020.  ED Course:Blood pressure elevated on arrival, remainder of vital signs stable. WBC 10.6, hemoglobin 12.9, platelet count 292K.Sodium 136, potassium 2.5, chloride 99, bicarb 25, BUN 11, creatinine 0.8, glucose 176. Magnesium 1.7. No gross hematuria, urine microscopic examination showing 6-10 RBCs/hpf. CT showing perforated distal descending colon diverticulitis with surrounding inflammatory changes. No pericolonic abscess. Small amount of pneumoperitoneum under the hemidiaphragms. Patient was given morphine, Zofran, Zosyn, and potassium supplementation. ED provider discussed the case with Dr. Georgette Dover from general surgery who recommended continuing antibiotic therapy and surgery team will see the patient in consultation.  Marked electrolytes derangement that are being repleted.  Pain control in place as needed.  CPAP QHS.  She was seen by general surgery.  No plan for surgical intervention.  On broad-spectrum IV antibiotics empirically, Zosyn.  Started on clear  liquid diet, advancing her diet on 02/10/2020, low residual diet as recommended by surgery.  We will continue IV Zosyn for now, repeat CT abdomen and pelvis with contrast, if all is well with discharge on 02/11/2020 with oral antibiotics.  02/10/20: Seen and examined.  She wants to eat.  Abdominal pain is improved.  Assessment/Plan: Principal Problem:   Perforated diverticulum Active Problems:   HTN (hypertension)   Hypokalemia   Pneumoperitoneum   HLD (hyperlipidemia)  Perforated distal descending colon diverticulitis, POA. Presented with left lower quadrant abdominal pain of 2 days duration, associated with fever at home. CT scan evidence of distal descending colon diverticulitis with perforation. Started on IV antibiotics empirically, Zosyn, day #3. Repeat CT abdomen and pelvis with contrast on 02/10/2020 if all is well we will switch to oral antibiotics for DC planning on 02/11/2020. General surgery consulted and signed off on 02/10/2020. Blood cultures negative to date.  Continue to follow cultures. Her pain is improved, continue pain control as needed  Small amount of pneumoperitoneum under the hemidiaphragms in the setting of perforated diverticulitis. Continue to monitor, follow repeated CT abdomen and pelvis with contrast on 02/10/2020.  Resolved post repletion: Refractory hypokalemia, chronic She was on oral potassium replacement prior to admission Presented with serum potassium of 2.5>> 4.1.  Chronic anxiety/depression Stable Continue home Prozac.  Resolved hypomagnesemia Repleted intravenously Serum magnesium 2.0.   Code Status: Full code  Family Communication: None at bedside  Disposition Plan: Likely to home on 02/11/2020.   Consultants:  General surgery  Procedures:  None.  Antimicrobials:  Zosyn.  DVT prophylaxis: Subcu Lovenox daily  Status is: Inpatient    Dispo:  Patient From: Home  Planned Disposition: Home  Expected discharge date:  02/11/2020  Medically stable for discharge: No, ongoing management of perforated diverticulitis.  Objective: Vitals:   02/09/20 1402 02/09/20 2156 02/09/20 2330 02/10/20 0415  BP: (!) 145/80 (!) 156/72  (!) 154/69  Pulse: 74 76 78 70  Resp: 18 18 16 18   Temp: 98.8 F (37.1 C) 99.1 F (37.3 C)  98.7 F (37.1 C)  TempSrc: Oral Oral  Oral  SpO2: 98% 94% 95% 97%  Weight:      Height:        Intake/Output Summary (Last 24 hours) at 02/10/2020 1716 Last data filed at 02/10/2020 1300 Gross per 24 hour  Intake 1131.76 ml  Output -  Net 1131.76 ml   Filed Weights   02/07/20 1641  Weight: 74.4 kg    Exam:  . General: 65 y.o. year-old female well-developed well-nourished in no acute distress.  Alert and oriented x3.   . Cardiovascular: Regular rate and rhythm no rubs or gallops.  [name] Respiratory: Clear to auscultation no wheeze no rales.   . Abdomen: Soft minimally tender left lower quadrant.  Bowel sounds present.  . Musculoskeletal: No lower extremity edema bilaterally.   . Skin: No ulcerative lesions noted. [name] Psychiatry: Mood is appropriate for condition and setting.   Data Reviewed: CBC: Recent Labs  Lab 02/08/20 0049 02/09/20 0049 02/10/20 0643  WBC 10.6* 8.6 7.2  NEUTROABS 6.7 5.6  --   HGB 12.9 11.7* 11.6*  HCT 35.6* 32.7* 32.4*  MCV 84.8 86.1 85.9  PLT 292 265 741   Basic Metabolic Panel: Recent Labs  Lab 02/08/20 0049 02/08/20 0105 02/09/20 0049 02/10/20 0643  NA 136  --  139 139  K 2.5*  --  3.1* 4.1  CL 99  --  104 106  CO2 25  --  24 23  GLUCOSE 176*  --  100* 133*  BUN 11  --  7* 5*  CREATININE 0.83  --  0.85 0.87  CALCIUM 9.1  --  8.2* 8.8*  MG  --  1.7 2.0 1.8   GFR: Estimated Creatinine Clearance: 58.6 mL/min (by C-G formula based on SCr of 0.87 mg/dL). Liver Function Tests: Recent Labs  Lab 02/08/20 0049  AST 19  ALT 16  ALKPHOS 88  BILITOT 1.2  PROT 7.0  ALBUMIN 3.7   Recent Labs  Lab 02/08/20 0049  LIPASE 31    No results for input(s): AMMONIA in the last 168 hours. Coagulation Profile: No results for input(s): INR, PROTIME in the last 168 hours. Cardiac Enzymes: No results for input(s): CKTOTAL, CKMB, CKMBINDEX, TROPONINI in the last 168 hours. BNP (last 3 results) No results for input(s): PROBNP in the last 8760 hours. HbA1C: No results for input(s): HGBA1C in the last 72 hours. CBG: Recent Labs  Lab 02/09/20 1205 02/09/20 1755 02/09/20 2337 02/10/20 0557 02/10/20 1215  GLUCAP 100* 101* 114* 165* 108*   Lipid Profile: No results for input(s): CHOL, HDL, LDLCALC, TRIG, CHOLHDL, LDLDIRECT in the last 72 hours. Thyroid Function Tests: No results for input(s): TSH, T4TOTAL, FREET4, T3FREE, THYROIDAB in the last 72 hours. Anemia Panel: No results for input(s): VITAMINB12, FOLATE, FERRITIN, TIBC, IRON, RETICCTPCT in the last 72 hours. Urine analysis:    Component Value Date/Time   COLORURINE YELLOW 02/07/2020 1640   APPEARANCEUR CLEAR 02/07/2020 1640   LABSPEC 1.018 02/07/2020 1640   PHURINE 6.0 02/07/2020 1640   GLUCOSEU NEGATIVE 02/07/2020 1640   HGBUR MODERATE (A) 02/07/2020 1640   BILIRUBINUR NEGATIVE 02/07/2020 1640   KETONESUR NEGATIVE 02/07/2020 1640   PROTEINUR NEGATIVE 02/07/2020 1640   UROBILINOGEN 1.0 10/08/2014 1903  NITRITE NEGATIVE 02/07/2020 1640   LEUKOCYTESUR NEGATIVE 02/07/2020 1640   Sepsis Labs: @LABRCNTIP (procalcitonin:4,lacticidven:4)  ) Recent Results (from the past 240 hour(s))  SARS Coronavirus 2 by RT PCR (hospital order, performed in [facility] hospital lab) Nasopharyngeal Nasopharyngeal Swab     Status: None   Collection Time: 02/08/20  4:25 AM   Specimen: Nasopharyngeal Swab  Result Value Ref Range Status   SARS Coronavirus 2 NEGATIVE NEGATIVE Final    Comment: (NOTE) SARS-CoV-2 target nucleic acids are NOT DETECTED.  The SARS-CoV-2 RNA is generally detectable in upper and lower respiratory specimens during the acute phase of infection.  The lowest concentration of SARS-CoV-2 viral copies this assay can detect is 250 copies / mL. A negative result does not preclude SARS-CoV-2 infection and should not be used as the sole basis for treatment or other patient management decisions.  A negative result may occur with improper specimen collection / handling, submission of specimen other than nasopharyngeal swab, presence of viral mutation(s) within the areas targeted by this assay, and inadequate number of viral copies (<250 copies / mL). A negative result must be combined with clinical observations, patient history, and epidemiological information.  Fact Sheet for Patients:   StrictlyIdeas.no  Fact Sheet for Healthcare Providers: BankingDealers.co.za  This test is not yet approved or  cleared by the [place] FDA and has been authorized for detection and/or diagnosis of SARS-CoV-2 by FDA under an Emergency Use Authorization (EUA).  This EUA will remain in effect (meaning this test can be used) for the duration of the COVID-19 declaration under Section 564(b)(1) of the Act, 21 U.S.C. section 360bbb-3(b)(1), unless the authorization is terminated or revoked sooner.  Performed at [facility], [place] [address] [place], [place] [address]       Studies: No results found.  Scheduled Meds: . enoxaparin (LOVENOX) injection  40 mg Subcutaneous Q24H  . FLUoxetine  60 mg Oral Daily  . potassium chloride SA  20 mEq Oral QHS  . vitamin B-12  1,000 mcg Oral QHS    Continuous Infusions: . dextrose 5% lactated ringers with KCl 20 mEq/L 10 mL/hr at 02/10/20 0655  . piperacillin-tazobactam (ZOSYN)  IV 3.375 g (02/10/20 1338)     LOS: 2 days     Kayleen Memos, MD [facility] Pager [phone]  If 7PM-7AM, please contact night-coverage [email or web address] Password [facility] 02/10/2020, 5:16 PM

## 2020-02-10 NOTE — Plan of Care (Signed)

## 2020-02-10 NOTE — Progress Notes (Signed)
Patient ID: April Stevens, female   DOB: April 12, 1955, 65 y.o.   MRN: [id number] [facility] Surgery Progress Note:   * No surgery found *  Subjective: Mental status is clear and feeling much better.  Complaints none. Objective: Vital signs in last 24 hours: Temp:  [98 F (36.7 C)-99.1 F (37.3 C)] 98.7 F (37.1 C) (01/23 0415) Pulse Rate:  [70-78] 70 (01/23 0415) Resp:  [16-18] 18 (01/23 0415) BP: (138-156)/(68-80) 154/69 (01/23 0415) SpO2:  [93 %-98 %] 97 % (01/23 0415)  Intake/Output from previous day: 01/22 0701 - 01/23 0700 In: 891.8 [I.V.:718.6; IV Piggyback:173.2] Out: -  Intake/Output this shift: No intake/output data recorded.  Physical Exam: Work of breathing is normal.  Pain better.    Lab Results:  Results for orders placed or performed during the hospital encounter of 02/07/20 (from the past 48 hour(s))  Glucose, capillary     Status: Abnormal   Collection Time: 02/08/20 11:07 PM  Result Value Ref Range   Glucose-Capillary 105 (H) 70 - 99 mg/dL    Comment: Glucose reference range applies only to samples taken after fasting for at least 8 hours.  Basic metabolic panel     Status: Abnormal   Collection Time: 02/09/20 12:49 AM  Result Value Ref Range   Sodium 139 135 - 145 mmol/L   Potassium 3.1 (L) 3.5 - 5.1 mmol/L   Chloride 104 98 - 111 mmol/L   CO2 24 22 - 32 mmol/L   Glucose, Bld 100 (H) 70 - 99 mg/dL    Comment: Glucose reference range applies only to samples taken after fasting for at least 8 hours.   BUN 7 (L) 8 - 23 mg/dL   Creatinine, Ser 0.85 0.44 - 1.00 mg/dL   Calcium 8.2 (L) 8.9 - 10.3 mg/dL   GFR, Estimated >60 >60 mL/min    Comment: (NOTE) Calculated using the CKD-EPI Creatinine Equation (2021)    Anion gap 11 5 - 15    Comment: Performed at [facility] [address], [place], [place] [address]  Magnesium     Status: None   Collection Time: 02/09/20 12:49 AM  Result Value Ref Range   Magnesium 2.0 1.7 - 2.4 mg/dL    Comment:  Performed at [facility] [address] [place], [place] [address]  CBC with Differential/Platelet     Status: Abnormal   Collection Time: 02/09/20 12:49 AM  Result Value Ref Range   WBC 8.6 4.0 - 10.5 K/uL   RBC 3.80 (L) 3.87 - 5.11 MIL/uL   Hemoglobin 11.7 (L) 12.0 - 15.0 g/dL   HCT 32.7 (L) 36.0 - 46.0 %   MCV 86.1 80.0 - 100.0 fL   MCH 30.8 26.0 - 34.0 pg   MCHC 35.8 30.0 - 36.0 g/dL   RDW 13.3 11.5 - 15.5 %   Platelets 265 150 - 400 K/uL   nRBC 0.0 0.0 - 0.2 %   Neutrophils Relative % 66 %   Neutro Abs 5.6 1.7 - 7.7 K/uL   Lymphocytes Relative 25 %   Lymphs Abs 2.1 0.7 - 4.0 K/uL   Monocytes Relative 7 %   Monocytes Absolute 0.6 0.1 - 1.0 K/uL   Eosinophils Relative 2 %   Eosinophils Absolute 0.2 0.0 - 0.5 K/uL   Basophils Relative 0 %   Basophils Absolute 0.0 0.0 - 0.1 K/uL   Immature Granulocytes 0 %   Abs Immature Granulocytes 0.02 0.00 - 0.07 K/uL  Comment: Performed at [facility], [place] [address] [place], [place] [address]  Glucose, capillary     Status: Abnormal   Collection Time: 02/09/20 12:05 PM  Result Value Ref Range   Glucose-Capillary 100 (H) 70 - 99 mg/dL    Comment: Glucose reference range applies only to samples taken after fasting for at least 8 hours.  Glucose, capillary     Status: Abnormal   Collection Time: 02/09/20  5:55 PM  Result Value Ref Range   Glucose-Capillary 101 (H) 70 - 99 mg/dL    Comment: Glucose reference range applies only to samples taken after fasting for at least 8 hours.  Glucose, capillary     Status: Abnormal   Collection Time: 02/09/20 11:37 PM  Result Value Ref Range   Glucose-Capillary 114 (H) 70 - 99 mg/dL    Comment: Glucose reference range applies only to samples taken after fasting for at least 8 hours.   Comment 1 Notify RN   Glucose, capillary     Status: Abnormal   Collection Time: 02/10/20  5:57 AM  Result Value Ref Range   Glucose-Capillary 165 (H) 70 - 99 mg/dL    Comment: Glucose reference  range applies only to samples taken after fasting for at least 8 hours.  Basic metabolic panel     Status: Abnormal   Collection Time: 02/10/20  6:43 AM  Result Value Ref Range   Sodium 139 135 - 145 mmol/L   Potassium 4.1 3.5 - 5.1 mmol/L   Chloride 106 98 - 111 mmol/L   CO2 23 22 - 32 mmol/L   Glucose, Bld 133 (H) 70 - 99 mg/dL    Comment: Glucose reference range applies only to samples taken after fasting for at least 8 hours.   BUN 5 (L) 8 - 23 mg/dL   Creatinine, Ser 0.87 0.44 - 1.00 mg/dL   Calcium 8.8 (L) 8.9 - 10.3 mg/dL   GFR, Estimated >60 >60 mL/min    Comment: (NOTE) Calculated using the CKD-EPI Creatinine Equation (2021)    Anion gap 10 5 - 15    Comment: Performed at [facility] [address], [place], [place] [address]  Magnesium     Status: None   Collection Time: 02/10/20  6:43 AM  Result Value Ref Range   Magnesium 1.8 1.7 - 2.4 mg/dL    Comment: Performed at [facility] [address], [place], [place] [address]  CBC     Status: Abnormal   Collection Time: 02/10/20  6:43 AM  Result Value Ref Range   WBC 7.2 4.0 - 10.5 K/uL   RBC 3.77 (L) 3.87 - 5.11 MIL/uL   Hemoglobin 11.6 (L) 12.0 - 15.0 g/dL   HCT 32.4 (L) 36.0 - 46.0 %   MCV 85.9 80.0 - 100.0 fL   MCH 30.8 26.0 - 34.0 pg   MCHC 35.8 30.0 - 36.0 g/dL   RDW 13.0 11.5 - 15.5 %   Platelets 284 150 - 400 K/uL   nRBC 0.0 0.0 - 0.2 %    Comment: Performed at [facility], [place] [address], [place], [place] [address]    Radiology/Results: No results found.  Anti-infectives: Anti-infectives (From admission, onward)   Start     Dose/Rate Route Frequency Ordered Stop   02/08/20 1200  piperacillin-tazobactam (ZOSYN) IVPB 3.375 g        3.375 g 12.5 mL/hr over 240 Minutes Intravenous Every 8 hours 02/08/20 0447  02/08/20 0400  piperacillin-tazobactam (ZOSYN) IVPB 3.375 g        3.375 g 100 mL/hr over 30 Minutes Intravenous  Once 02/08/20 0357 02/08/20 0510       Assessment/Plan: Problem List: Patient Active Problem List   Diagnosis Date Noted  . Perforated diverticulum 02/08/2020  . Pneumoperitoneum 02/08/2020  . HLD (hyperlipidemia) 02/08/2020  . Pure hypercholesterolemia 10/02/2019  . Prediabetes 10/02/2019  . Hypokalemia 03/29/2019  . Acute appendicitis 09/26/2018  . Obstructive sleep apnea treated with continuous positive airway pressure (CPAP) 08/01/2017  . Inadequate sleep hygiene 02/22/2017  . Excessive daytime sleepiness 02/22/2017  . Sleep deprivation 02/22/2017  . Snoring 02/22/2017  . OSA (obstructive sleep apnea) 02/22/2017  . HTN (hypertension) 02/23/2012  . Anxiety and depression 02/23/2012    We discussed getting over acute diverticulitis.  Would keep on low residue diet for ~ 4 weeks.  She is OK to go home on oral antibiotics from our standpoint.   * No surgery found *    LOS: 2 days   Matt B. Hassell Done, MD, [facility] [phone] to reach the surgeon on call.    02/10/2020 9:50 AM

## 2020-02-10 NOTE — Progress Notes (Signed)
PT Cancellation Note  Patient Details Name: April Stevens MRN: [id number] DOB: Aug 26, 1955   Cancelled Treatment:    Reason Eval/Treat Not Completed: PT screened, no needs identified, will sign off.  Patient ambulating in room by herself to and from bathroom, no recent falls at home, reports she does not need PT.  Will sign off.  Thank you,   Shanna Cisco 02/10/2020, 12:16 PM

## 2020-02-11 LAB — GLUCOSE, CAPILLARY
Glucose-Capillary: 112 mg/dL — ABNORMAL HIGH (ref 70–99)
Glucose-Capillary: 122 mg/dL — ABNORMAL HIGH (ref 70–99)
Glucose-Capillary: 216 mg/dL — ABNORMAL HIGH (ref 70–99)

## 2020-02-11 MED ORDER — AMOXICILLIN-POT CLAVULANATE ER 1000-62.5 MG PO TB12
2.0000 | ORAL_TABLET | Freq: Two times a day (BID) | ORAL | Status: DC
  Administered 2020-02-11: 2 via ORAL
  Filled 2020-02-11 (×2): qty 2

## 2020-02-11 MED ORDER — AMLODIPINE BESYLATE 5 MG PO TABS
5.0000 mg | ORAL_TABLET | Freq: Every day | ORAL | Status: DC
  Administered 2020-02-11: 5 mg via ORAL
  Filled 2020-02-11: qty 1

## 2020-02-11 MED ORDER — AMOXICILLIN-POT CLAVULANATE ER 1000-62.5 MG PO TB12: 2.0000 | ORAL_TABLET | Freq: Two times a day (BID) | ORAL | 0 refills | Status: AC

## 2020-02-11 MED ORDER — AMLODIPINE BESYLATE 5 MG PO TABS: 5.0000 mg | ORAL_TABLET | Freq: Every day | ORAL | 0 refills | Status: DC

## 2020-02-11 NOTE — Progress Notes (Signed)
Subjective/Chief Complaint: Feeling much better   Objective: Vital signs in last 24 hours: Temp:  [98.2 F (36.8 C)-99 F (37.2 C)] 98.3 F (36.8 C) (01/24 0610) Pulse Rate:  [66-73] 73 (01/24 0610) Resp:  [18] 18 (01/24 0610) BP: (138-177)/(69-83) 169/81 (01/24 0610) SpO2:  [95 %-98 %] 96 % (01/24 0610) Last BM Date: 02/10/20 (per patient)  Intake/Output from previous day: 01/23 0701 - 01/24 0700 In: 1656 [P.O.:420; I.V.:1087.4; IV Piggyback:148.6] Out: -  Intake/Output this shift: No intake/output data recorded.  General appearance: alert and cooperative Resp: Unlabored Cardio: regular rate and rhythm GI: soft, non-tender; bowel sounds normal; no masses,  no organomegaly Extremities: extremities normal, atraumatic, no cyanosis or edema Skin: Skin color, texture, turgor normal. No rashes or lesions Neurologic: Grossly normal  Lab Results:  Recent Labs    02/09/20 0049 02/10/20 0643  WBC 8.6 7.2  HGB 11.7* 11.6*  HCT 32.7* 32.4*  PLT 265 284   BMET Recent Labs    02/09/20 0049 02/10/20 0643  NA 139 139  K 3.1* 4.1  CL 104 106  CO2 24 23  GLUCOSE 100* 133*  BUN 7* 5*  CREATININE 0.85 0.87  CALCIUM 8.2* 8.8*   PT/INR No results for input(s): LABPROT, INR in the last 72 hours. ABG No results for input(s): PHART, HCO3 in the last 72 hours.  Invalid input(s): PCO2, PO2  Studies/Results: CT ABDOMEN PELVIS W CONTRAST  Result Date: 02/10/2020 CLINICAL DATA:  Follow-up on distal colonic diverticulitis. Left-sided abdominal pain. EXAM: CT ABDOMEN AND PELVIS WITH CONTRAST TECHNIQUE: Multidetector CT imaging of the abdomen and pelvis was performed using the standard protocol following bolus administration of intravenous contrast. CONTRAST:  163mL OMNIPAQUE IOHEXOL 300 MG/ML  SOLN COMPARISON:  02/08/2020 FINDINGS: Lower chest: Patchy infiltrates suggested in the lung bases, unchanged. Hepatobiliary: Focal enhancing lesion in the dome of the liver likely  representing a hemangioma. Cholelithiasis with several stones in the gallbladder. No inflammatory changes. No bile duct dilatation. Pancreas: Unremarkable. No pancreatic ductal dilatation or surrounding inflammatory changes. Spleen: Normal in size without focal abnormality. Adrenals/Urinary Tract: Adrenal glands are unremarkable. Kidneys are normal, without renal calculi, focal lesion, or hydronephrosis. Bladder wall is diffusely thickened, possibly cystitis. Correlate with urinalysis. Stomach/Bowel: The stomach, small bowel, and colon are not abnormally distended. Colonic diverticulosis. Pericolonic infiltration and small pericolonic gas collections in the junction of the descending and sigmoid colon consistent with acute diverticulitis. Similar appearance to previous study. No developing abscess or collection. Decreased free intraperitoneal air since the previous study suggesting interval resorption. No free fluid. Vascular/Lymphatic: Aortic atherosclerosis. No enlarged abdominal or pelvic lymph nodes. Reproductive: Status post hysterectomy. No adnexal masses. Other: Small periumbilical hernia containing fat. Musculoskeletal: Spondylolysis with moderate spondylolisthesis at L5-S1. IMPRESSION: 1. Colonic diverticulosis with acute diverticulitis at the junction of the descending and sigmoid colon. No developing abscess or collection. 2. Decreased free intraperitoneal air since the previous study suggesting interval resorption. 3. Patchy infiltrates suggested in the lung bases, unchanged. 4. Cholelithiasis without evidence of cholecystitis. 5. Small periumbilical hernia containing fat. 6. Spondylolysis with moderate spondylolisthesis at L5-S1. 7. Aortic atherosclerosis. Aortic Atherosclerosis (ICD10-I70.0). Electronically Signed   By: Lucienne Capers M.D.   On: 02/10/2020 21:35    Anti-infectives: Anti-infectives (From admission, onward)   Start     Dose/Rate Route Frequency Ordered Stop   02/11/20 1000   amoxicillin-clavulanate (AUGMENTIN XR) 1000-62.5 MG per 12 hr tablet 2 tablet        2 tablet Oral Every 12  hours 02/11/20 0623 02/21/20 0959   02/11/20 0000  amoxicillin-clavulanate (AUGMENTIN XR) 1000-62.5 MG 12 hr tablet        2 tablet Oral Every 12 hours 02/11/20 0737 02/21/20 2359   02/08/20 1200  piperacillin-tazobactam (ZOSYN) IVPB 3.375 g  Status:  Discontinued        3.375 g 12.5 mL/hr over 240 Minutes Intravenous Every 8 hours 02/08/20 0447 02/11/20 0623   02/08/20 0400  piperacillin-tazobactam (ZOSYN) IVPB 3.375 g        3.375 g 100 mL/hr over 30 Minutes Intravenous  Once 02/08/20 0357 02/08/20 0510      Assessment/Plan: [name]  HTN  Hypokalemia   Perforated Diverticulitis -Microperforation.  Repeat CT yesterday demonstrates improvement in inflammation and decrease in free peritoneal air.  No abscess.  She has improved clinically, vitals and labs are now normal and pain is significantly better. -Okay for discharge from surgery standpoint.  Recommend that she have a colonoscopy in 6 weeks or so-she states she does not have care established with a gastroenterologist currently.   [facility] 02/11/2020 8:58 AM  Please see Amion page floor coverage 7:00am-4:30pm   LOS: 3 days    Clovis Riley 02/11/2020

## 2020-02-11 NOTE — Discharge Instructions (Signed)
Diverticulitis  Diverticulitis is when small pouches in your colon (large intestine) get infected or swollen. This causes pain in the belly (abdomen) and watery poop (diarrhea). These pouches are called diverticula. The pouches form in people who have a condition called diverticulosis. What are the causes? This condition may be caused by poop (stool) that gets trapped in the pouches in your colon. The poop lets germs (bacteria) grow in the pouches. This causes the infection. What increases the risk? You are more likely to get this condition if you have small pouches in your colon. The risk is higher if:  You are overweight or very overweight (obese).  You do not exercise enough.  You drink alcohol.  You smoke or use products with tobacco in them.  You eat a diet that has a lot of red meat such as beef, pork, or lamb.  You eat a diet that does not have enough fiber in it.  You are older than 66 years of age. What are the signs or symptoms?  Pain in the belly. Pain is often on the left side, but it may be in other areas.  Fever and feeling cold.  Feeling like you may vomit.  Vomiting.  Having cramps.  Feeling full.  Changes to how often you poop.  Blood in your poop. How is this treated? Most cases are treated at home by:  Taking over-the-counter pain medicines.  Following a clear liquid diet.  Taking antibiotic medicines.  Resting. Very bad cases may need to be treated at a hospital. This may include:  Not eating or drinking.  Taking prescription pain medicine.  Getting antibiotic medicines through an IV tube.  Getting fluid and food through an IV tube.  Having surgery. When you are feeling better, your doctor may tell you to have a test to check your colon (colonoscopy). Follow these instructions at home: Medicines  Take over-the-counter and prescription medicines only as told by your doctor. These include: ? Antibiotics. ? Pain medicines. ? Fiber  pills. ? Probiotics. ? Stool softeners.  If you were prescribed an antibiotic medicine, take it as told by your doctor. Do not stop taking the antibiotic even if you start to feel better.  Ask your doctor if the medicine prescribed to you requires you to avoid driving or using machinery. Eating and drinking  Follow a diet as told by your doctor.  When you feel better, your doctor may tell you to change your diet. You may need to eat a lot of fiber. Fiber makes it easier to poop (have a bowel movement). Foods with fiber include: ? Berries. ? Beans. ? Lentils. ? Green vegetables.  Avoid eating red meat.   General instructions  Do not use any products that contain nicotine or tobacco, such as cigarettes, e-cigarettes, and chewing tobacco. If you need help quitting, ask your doctor.  Exercise 3 or more times a week. Try to get 30 minutes each time. Exercise enough to sweat and make your heart beat faster.  Keep all follow-up visits as told by your doctor. This is important. Contact a doctor if:  Your pain does not get better.  You are not pooping like normal. Get help right away if:  Your pain gets worse.  Your symptoms do not get better.  Your symptoms get worse very fast.  You have a fever.  You vomit more than one time.  You have poop that is: ? Bloody. ? Black. ? Tarry. Summary  This condition happens when   small pouches in your colon get infected or swollen.  Take medicines only as told by your doctor.  Follow a diet as told by your doctor.  Keep all follow-up visits as told by your doctor. This is important. This information is not intended to replace advice given to you by your health care provider. Make sure you discuss any questions you have with your health care provider. Document Revised: 10/16/2018 Document Reviewed: 10/16/2018 Elsevier Patient Education  2021 Elsevier Inc.  

## 2020-02-11 NOTE — Discharge Summary (Signed)
Discharge Summary  April Stevens [id number] DOB: 16-Nov-1955  PCP: [name], [name], MD  Admit date: 02/07/2020 Discharge date: 02/11/2020  Time spent: 35 minutes.  Recommendations for Outpatient Follow-up:  1. Follow-up with general surgery 2. Follow-up with GI in 6 to 8 weeks for possible colonoscopy. 3. Follow-up with your primary care provider 4. Take your medications as prescribed.  Discharge Diagnoses:  Active Hospital Problems   Diagnosis Date Noted  . Perforated diverticulum 02/08/2020  . Pneumoperitoneum 02/08/2020  . HLD (hyperlipidemia) 02/08/2020  . Hypokalemia 03/29/2019  . HTN (hypertension) 02/23/2012    Resolved Hospital Problems  No resolved problems to display.    Discharge Condition: Stable  Diet recommendation: Continue low residual diet.  Vitals:   02/11/20 0102 02/11/20 0610  BP: 138/69 (!) 169/81  Pulse: 66 73  Resp: 18 18  Temp: 99 F (37.2 C) 98.3 F (36.8 C)  SpO2: 95% 96%    History of present illness:  April Stevens a 65 y.o.femalewith medical history significant ofessential hypertension, chronic depression/anxiety, [name], surgical history of appendectomy and hysterectomy with salpingo-oophorectomy presentingwith complaint of left lower quadrant abdominal pain of 1 day duration.  ED Course:Blood pressure elevated on arrival, remainder of vital signs stable. WBC 10.6K, hemoglobin 12.9K, platelet count 292K.Sodium 136, potassium 2.5, chloride 99, bicarb 25, BUN 11, creatinine 0.8, glucose 176. Magnesium 1.7. No gross hematuria, urine microscopic examination showing 6-10 RBCs/hpf. CT showing perforated distal descending colon diverticulitis with surrounding inflammatory changes. No pericolonic abscess. Small amount of pneumoperitoneum under the hemidiaphragms. Patient was given morphine, Zofran, Zosyn, and potassium supplementation. ED provider discussed the case with Dr. Georgette Dover from general surgery who recommended  continuing antibiotic therapy, conservative management.  Hospital course complicated by Marked electrolytes derangement that were repleted.   Was seen by general surgery.  No plan for surgical intervention.    She completed 4 days of IV antibiotics, Zosyn.  Was restarted on normal consistency diet on 02/10/2020, general surgery recommended low residual diet for about 4 weeks.     02/11/20:  Seen in the room.  There were no acute events overnight.  She is eager to go home.  She has no new complaints.  Hospital Course:  Principal Problem:   Perforated diverticulum Active Problems:   HTN (hypertension)   Hypokalemia   Pneumoperitoneum   HLD (hyperlipidemia)  Perforated distal descending colon diverticulitis, POA. Presented with left lower quadrant abdominal pain of 1 day duration, associated with fever at home. CT scan showed evidence of distal descending colon diverticulitis with perforation. Completed 4 days of IV Zosyn.  Switched to Augmentin 2 tablet twice daily x10 days on 02/11/2020. Repeat CT abdomen and pelvis with contrast on 02/10/2020 showed: 1. Colonic diverticulosis with acute diverticulitis at the junction of the descending and sigmoid colon. No developing abscess or collection. 2. Decreased free intraperitoneal air since the previous study suggesting interval resorption. 3. Patchy infiltrates suggested in the lung bases, unchanged. 4. Cholelithiasis without evidence of cholecystitis. 5. Small periumbilical hernia containing fat. 6. Spondylolysis with moderate spondylolisthesis at L5-S1. 7. Aortic atherosclerosis. Follow-up with general surgery in 1 to 2 weeks Follow-up with GI in 6 to 8 weeks for possible colonoscopy.  Improving Small amount of pneumoperitoneum under the hemidiaphragms in the setting of perforated diverticulitis. Repeated CT abdomen and pelvis with contrast on 02/10/2020 with findings as stated above.  Resolved post repletion: Refractory  hypokalemia, chronic She was on oral potassium replacement prior to admission Presented with serum potassium  of 2.5>> 4.1.  Chronic anxiety/depression Stable Continue home Prozac.  Resolved hypomagnesemia Repleted intravenously Serum magnesium 2.0.   Code Status: Full code   Consultants:  General surgery  Procedures:  None.  Antimicrobials:  Zosyn, completed on 02/10/2018  Augmentin start on 02/11/20 x 10 days.   Discharge Exam: BP (!) 169/81 (BP Location: Left Arm)   Pulse 73   Temp 98.3 F (36.8 C) (Oral)   Resp 18   Ht 5\' 1"  (1.549 m)   Wt 74.4 kg   SpO2 96%   BMI 30.99 kg/m  . General: 65 y.o. year-old female well developed well nourished in no acute distress.  Alert and oriented x3. . Cardiovascular: Regular rate and rhythm with no rubs or gallops.  No thyromegaly or JVD noted.   [name] Respiratory: Clear to auscultation with no wheezes or rales. Good inspiratory effort. . Abdomen: Soft nontender nondistended with normal bowel sounds x4 quadrants. . Musculoskeletal: No lower extremity edema bilaterally.  [name] Psychiatry: Mood is appropriate for condition and setting  Discharge Instructions You were cared for by a hospitalist during your hospital stay. If you have any questions about your discharge medications or the care you received while you were in the hospital after you are discharged, you can call the unit and asked to speak with the hospitalist on call if the hospitalist that took care of you is not available. Once you are discharged, your primary care physician will handle any further medical issues. Please note that NO REFILLS for any discharge medications will be authorized once you are discharged, as it is imperative that you return to your primary care physician (or establish a relationship with a primary care physician if you do not have one) for your aftercare needs so that they can reassess your need for medications and monitor your lab  values.   Allergies as of 02/11/2020   No Known Allergies     Medication List    STOP taking these medications   chlorthalidone 25 MG tablet Commonly known as: HYGROTON   potassium chloride SA 20 MEQ tablet Commonly known as: Klor-Con M20     TAKE these medications   amLODipine 5 MG tablet Commonly known as: NORVASC Take 1 tablet (5 mg total) by mouth daily. Start taking on: February 12, 2020   amoxicillin-clavulanate 1000-62.5 MG 12 hr tablet Commonly known as: AUGMENTIN XR Take 2 tablets by mouth every 12 (twelve) hours for 10 days.   aspirin 81 MG tablet You can restart your baby aspirin 1 daily 09/28/2018. What changed:   how much to take  how to take this  when to take this  additional instructions   atorvastatin 20 MG tablet Commonly known as: LIPITOR Take 1 tablet (20 mg total) by mouth daily. What changed: when to take this   FLUoxetine 20 MG capsule Commonly known as: PROzac Take 3 capsules (60 mg total) by mouth daily. What changed: when to take this   vitamin B-12 1000 MCG tablet Commonly known as: CYANOCOBALAMIN Take 1,000 mcg by mouth at bedtime.      No Known Allergies  Follow-up Information    [name], [name], MD. Call in 1 day(s).   Specialty: Family Medicine Why: Please call for a post hospital follow-up appointment. Contact information: [address] [place] [address] [phone]        Johnathan Hausen, MD. Call in 1 day(s).   Specialty: General Surgery Why: Please call for a post hospital follow-up appointment. Contact information:  [address] [place] [address] [phone]        Doran Stabler, MD. Call in 1 day(s).   Specialty: Gastroenterology Why: Please call to schedule a colonoscopy in 6 to 8 weeks. Contact information: [place] [address] [phone]                The results of significant diagnostics from this hospitalization (including  imaging, microbiology, ancillary and laboratory) are listed below for reference.    Significant Diagnostic Studies: CT ABDOMEN PELVIS W CONTRAST  Result Date: 02/10/2020 CLINICAL DATA:  Follow-up on distal colonic diverticulitis. Left-sided abdominal pain. EXAM: CT ABDOMEN AND PELVIS WITH CONTRAST TECHNIQUE: Multidetector CT imaging of the abdomen and pelvis was performed using the standard protocol following bolus administration of intravenous contrast. CONTRAST:  11mL OMNIPAQUE IOHEXOL 300 MG/ML  SOLN COMPARISON:  02/08/2020 FINDINGS: Lower chest: Patchy infiltrates suggested in the lung bases, unchanged. Hepatobiliary: Focal enhancing lesion in the dome of the liver likely representing a hemangioma. Cholelithiasis with several stones in the gallbladder. No inflammatory changes. No bile duct dilatation. Pancreas: Unremarkable. No pancreatic ductal dilatation or surrounding inflammatory changes. Spleen: Normal in size without focal abnormality. Adrenals/Urinary Tract: Adrenal glands are unremarkable. Kidneys are normal, without renal calculi, focal lesion, or hydronephrosis. Bladder wall is diffusely thickened, possibly cystitis. Correlate with urinalysis. Stomach/Bowel: The stomach, small bowel, and colon are not abnormally distended. Colonic diverticulosis. Pericolonic infiltration and small pericolonic gas collections in the junction of the descending and sigmoid colon consistent with acute diverticulitis. Similar appearance to previous study. No developing abscess or collection. Decreased free intraperitoneal air since the previous study suggesting interval resorption. No free fluid. Vascular/Lymphatic: Aortic atherosclerosis. No enlarged abdominal or pelvic lymph nodes. Reproductive: Status post hysterectomy. No adnexal masses. Other: Small periumbilical hernia containing fat. Musculoskeletal: Spondylolysis with moderate spondylolisthesis at L5-S1. IMPRESSION: 1. Colonic diverticulosis with acute  diverticulitis at the junction of the descending and sigmoid colon. No developing abscess or collection. 2. Decreased free intraperitoneal air since the previous study suggesting interval resorption. 3. Patchy infiltrates suggested in the lung bases, unchanged. 4. Cholelithiasis without evidence of cholecystitis. 5. Small periumbilical hernia containing fat. 6. Spondylolysis with moderate spondylolisthesis at L5-S1. 7. Aortic atherosclerosis. Aortic Atherosclerosis (ICD10-I70.0). Electronically Signed   By: Lucienne Capers M.D.   On: 02/10/2020 21:35   CT Abdomen Pelvis W Contrast  Result Date: 02/08/2020 CLINICAL DATA:  Left lower quadrant abdominal pain EXAM: CT ABDOMEN AND PELVIS WITH CONTRAST TECHNIQUE: Multidetector CT imaging of the abdomen and pelvis was performed using the standard protocol following bolus administration of intravenous contrast. CONTRAST:  172mL OMNIPAQUE IOHEXOL 300 MG/ML  SOLN COMPARISON:  None. FINDINGS: Lower chest: The visualized heart size within normal limits. No pericardial fluid/thickening. No hiatal hernia. Streaky airspace opacity seen at both lung bases. Hepatobiliary: The liver is normal in density without focal abnormality.The main portal vein is patent. No evidence of calcified gallstones, gallbladder wall thickening or biliary dilatation. Pancreas: Unremarkable. No pancreatic ductal dilatation or surrounding inflammatory changes. Spleen: Normal in size without focal abnormality. Adrenals/Urinary Tract: Both adrenal glands appear normal. The kidneys and collecting system appear normal without evidence of urinary tract calculus or hydronephrosis. Bladder is unremarkable. Stomach/Bowel: The stomach, small bowel are normal in appearance. There is a focal segment of distal descending colon measuring 6.5 cm with diverticula and surrounding fat stranding changes and small foci of free air. No loculated fluid collections are noted. Vascular/Lymphatic: There are  no enlarged  mesenteric, retroperitoneal, or pelvic lymph nodes. Scattered aortic atherosclerotic calcifications are seen without aneurysmal dilatation. Reproductive: The patient is status post hysterectomy. No adnexal masses or collections seen. Other: Small amount of pneumoperitoneum seen under the hemidiaphragm as well as within the anterior upper abdomen. Musculoskeletal: No acute or significant osseous findings. IMPRESSION: Perforated distal descending colon diverticulitis with surrounding inflammatory changes. No pericolonic abscess Small amount of pneumoperitoneum under the hemidiaphragms Aortic Atherosclerosis (ICD10-I70.0). Electronically Signed   By: Prudencio Pair M.D.   On: 02/08/2020 03:03    Microbiology: Recent Results (from the past 240 hour(s))  SARS Coronavirus 2 by RT PCR (hospital order, performed in [facility] hospital lab) Nasopharyngeal Nasopharyngeal Swab     Status: None   Collection Time: 02/08/20  4:25 AM   Specimen: Nasopharyngeal Swab  Result Value Ref Range Status   SARS Coronavirus 2 NEGATIVE NEGATIVE Final    Comment: (NOTE) SARS-CoV-2 target nucleic acids are NOT DETECTED.  The SARS-CoV-2 RNA is generally detectable in upper and lower respiratory specimens during the acute phase of infection. The lowest concentration of SARS-CoV-2 viral copies this assay can detect is 250 copies / mL. A negative result does not preclude SARS-CoV-2 infection and should not be used as the sole basis for treatment or other patient management decisions.  A negative result may occur with improper specimen collection / handling, submission of specimen other than nasopharyngeal swab, presence of viral mutation(s) within the areas targeted by this assay, and inadequate number of viral copies (<250 copies / mL). A negative result must be combined with clinical observations, patient history, and epidemiological information.  Fact Sheet for Patients:    StrictlyIdeas.no  Fact Sheet for Healthcare Providers: BankingDealers.co.za  This test is not yet approved or  cleared by the [place] FDA and has been authorized for detection and/or diagnosis of SARS-CoV-2 by FDA under an Emergency Use Authorization (EUA).  This EUA will remain in effect (meaning this test can be used) for the duration of the COVID-19 declaration under Section 564(b)(1) of the Act, 21 U.S.C. section 360bbb-3(b)(1), unless the authorization is terminated or revoked sooner.  Performed at [facility], [place] [address], [place], [place] [address]      Labs: Basic Metabolic Panel: Recent Labs  Lab 02/08/20 0049 02/08/20 0105 02/09/20 0049 02/10/20 0643  NA 136  --  139 139  K 2.5*  --  3.1* 4.1  CL 99  --  104 106  CO2 25  --  24 23  GLUCOSE 176*  --  100* 133*  BUN 11  --  7* 5*  CREATININE 0.83  --  0.85 0.87  CALCIUM 9.1  --  8.2* 8.8*  MG  --  1.7 2.0 1.8   Liver Function Tests: Recent Labs  Lab 02/08/20 0049  AST 19  ALT 16  ALKPHOS 88  BILITOT 1.2  PROT 7.0  ALBUMIN 3.7   Recent Labs  Lab 02/08/20 0049  LIPASE 31   No results for input(s): AMMONIA in the last 168 hours. CBC: Recent Labs  Lab 02/08/20 0049 02/09/20 0049 02/10/20 0643  WBC 10.6* 8.6 7.2  NEUTROABS 6.7 5.6  --   HGB 12.9 11.7* 11.6*  HCT 35.6* 32.7* 32.4*  MCV 84.8 86.1 85.9  PLT 292 265 284   Cardiac Enzymes: No results for input(s): CKTOTAL, CKMB, CKMBINDEX, TROPONINI in the last 168 hours. BNP: BNP (last 3 results) No results for input(s): BNP in the last 8760 hours.  ProBNP (last 3 results) No results for input(s): PROBNP in the last 8760 hours.  CBG: Recent Labs  Lab 02/10/20 1215 02/10/20 1756 02/10/20 2333 02/11/20 0546 02/11/20 0736  GLUCAP 108* 88 190* 112* 122*       Signed:  Kayleen Memos, MD [facility] 02/11/2020, 11:38 AM

## 2020-02-11 NOTE — Progress Notes (Signed)
AVS given and reviewed with pt. Medications discussed. All questions answered to satisfaction. Pt verbalized understanding of information given. Pt escorted off the unit with all belongings via wheelchair by volunteer services.  

## 2020-02-12 ENCOUNTER — Telehealth: Payer: Self-pay | Admitting: *Deleted

## 2020-02-12 NOTE — Telephone Encounter (Signed)
Schedule Hospital follow up.  Patient last saw Dr. Pamella Pert

## 2020-03-14 DIAGNOSIS — M13811 Other specified arthritis, right shoulder: Secondary | ICD-10-CM | POA: Diagnosis not present

## 2020-05-06 ENCOUNTER — Ambulatory Visit: Payer: Medicare Other | Admitting: Adult Health

## 2020-05-06 VITALS — BP 175/80 | HR 76 | Ht 61.0 in | Wt 193.0 lb

## 2020-05-06 DIAGNOSIS — Z9989 Dependence on other enabling machines and devices: Secondary | ICD-10-CM

## 2020-05-06 DIAGNOSIS — G4733 Obstructive sleep apnea (adult) (pediatric): Secondary | ICD-10-CM | POA: Diagnosis not present

## 2020-05-06 NOTE — Patient Instructions (Signed)
Continue using CPAP nightly and greater than 4 hours each night Order sent for new supplies If your symptoms worsen or you develop new symptoms please let us know.   

## 2020-05-06 NOTE — Progress Notes (Signed)
PATIENT: April Stevens DOB: 17-Mar-1955  REASON FOR VISIT: follow up HISTORY FROM: patient  HISTORY OF PRESENT ILLNESS: Today 05/06/20:  April Stevens is a 65 year old female with a history of obstructive sleep apnea on CPAP.  She reports that the CPAP works well for her.  She has had a hard time getting new supplies from her DME company.  She has not changed her mask out in quite some time.  She returns today for an evaluation.   05/07/19: April Stevens is a 65 year old female with a history of obstructive sleep apnea on CPAP.  Her download indicates that she use her machine nightly for compliance of 100%.  She used her machine greater than 4 hours 26 days for compliance of 87%.  On average she uses her machine 5 hours and 56 minutes.  Her residual AHI is 4.1 on 5-15 cm of water with EPR 3.  Leak in the 95th percentile is 13.8 L/min.  She continues to work 2 jobs.  Reports that the CPAP continues to work well for her.  HISTORY 2/12/2020CM April Stevens 65 year old female returns for follow-up with history of obstructive sleep apnea here for CPAP compliance she has done well since last seen.  Compliance data dated 01/29/2018-02/27/2018 shows compliance greater than 4 hours at 93%.  Average usage 6 hours 46 minutes.  Set pressure 5 to 15 cm.  EPR level 3 leaks 95th percentile 49.9 AHI 4.3.  Patient is currently on leave of absence from  work since shoulder surgery she returns for reevaluation  REVIEW OF SYSTEMS: Out of a complete 14 system review of symptoms, the patient complains only of the following symptoms, and all other reviewed systems are negative.  FSS 37 ESS11  ALLERGIES: No Known Allergies  HOME MEDICATIONS: Outpatient Medications Prior to Visit  Medication Sig Dispense Refill  . aspirin 81 MG tablet You can restart your baby aspirin 1 daily 09/28/2018. (Patient taking differently: Take 81 mg by mouth at bedtime.) 365 tablet 0  . atorvastatin (LIPITOR) 20 MG tablet Take 1 tablet (20 mg total)  by mouth daily. (Patient taking differently: Take 20 mg by mouth at bedtime.) 90 tablet 1  . chlorthalidone (HYGROTON) 25 MG tablet Take 25 mg by mouth daily.    [name] FLUoxetine (PROZAC) 20 MG capsule Take 3 capsules (60 mg total) by mouth daily. (Patient taking differently: Take 60 mg by mouth at bedtime.) 270 capsule 1  . vitamin B-12 (CYANOCOBALAMIN) 1000 MCG tablet Take 1,000 mcg by mouth at bedtime.    [name] amLODipine (NORVASC) 5 MG tablet Take 1 tablet (5 mg total) by mouth daily. 30 tablet 0   No facility-administered medications prior to visit.    PAST MEDICAL HISTORY: Past Medical History:  Diagnosis Date  . Depression   . Hypertension   . [name]     PAST SURGICAL HISTORY: Past Surgical History:  Procedure Laterality Date  . APPENDECTOMY  09/26/2018  . HYSTERECTOMY ABDOMINAL WITH SALPINGO-OOPHORECTOMY  2000   DUB  . JOINT REPLACEMENT    . LAPAROSCOPIC APPENDECTOMY N/A 09/26/2018   Procedure: APPENDECTOMY LAPAROSCOPIC;  Surgeon: Kinsinger, Arta Bruce, MD;  Location: [facility];  Service: General;  Laterality: N/A;  . SHOULDER SURGERY Left    01-02-2018, has had R shoulder rotator cuff previously    FAMILY HISTORY: Family History  Problem Relation Age of Onset  . Hypertension Father   . Heart attack Father     SOCIAL HISTORY: Social History   Socioeconomic History  .  Marital status: Single    Spouse name: Not on file  . Number of children: Not on file  . Years of education: Not on file  . Highest education level: Not on file  Occupational History  . Not on file  Tobacco Use  . Smoking status: Former Smoker    Packs/day: 3.00    Years: 20.00    Pack years: 60.00    Quit date: 2000    Years since quitting: 22.3  . Smokeless tobacco: Never Used  Vaping Use  . Vaping Use: Never used  Substance and Sexual Activity  . Alcohol use: No    Alcohol/week: 0.0 standard drinks  . Drug use: No  . Sexual activity: Yes  Other Topics Concern  . Not on file  Social  History Narrative  . Not on file   Social Determinants of Health   Financial Resource Strain: Not on file  Food Insecurity: Not on file  Transportation Needs: Not on file  Physical Activity: Not on file  Stress: Not on file  Social Connections: Not on file  Intimate Partner Violence: Not on file      PHYSICAL EXAM  Vitals:   05/06/20 0900  BP: (!) 175/80  Pulse: 76  Weight: 193 lb (87.5 kg)  Height: 5\' 1"  (1.549 m)   Body mass index is 36.47 kg/m.  Generalized: Well developed, in no acute distress  Chest: Lungs clear to auscultation bilaterally  Neurological examination  Mentation: Alert oriented to time, place, history taking. Follows all commands speech and language fluent Cranial nerve II-XII: Extraocular movements were full, visual field were full on confrontational test Head turning and shoulder shrug  were normal and symmetric. Motor: The motor testing reveals 5 over 5 strength of all 4 extremities. Good symmetric motor tone is noted throughout.  Sensory: Sensory testing is intact to soft touch on all 4 extremities. No evidence of extinction is noted.  Gait and station: Gait is normal.    DIAGNOSTIC DATA (LABS, IMAGING, TESTING) - I reviewed patient records, labs, notes, testing and imaging myself where available.  Lab Results  Component Value Date   WBC 7.2 02/10/2020   HGB 11.6 (L) 02/10/2020   HCT 32.4 (L) 02/10/2020   MCV 85.9 02/10/2020   PLT 284 02/10/2020      Component Value Date/Time   NA 139 02/10/2020 0643   NA 139 06/29/2019 1157   K 4.1 02/10/2020 0643   CL 106 02/10/2020 0643   CO2 23 02/10/2020 0643   GLUCOSE 133 (H) 02/10/2020 0643   BUN 5 (L) 02/10/2020 0643   BUN 12 06/29/2019 1157   CREATININE 0.87 02/10/2020 0643   CREATININE 0.89 03/14/2014 1146   CALCIUM 8.8 (L) 02/10/2020 0643   PROT 7.0 02/08/2020 0049   PROT 7.2 06/29/2019 1157   ALBUMIN 3.7 02/08/2020 0049   ALBUMIN 4.3 06/29/2019 1157   AST 19 02/08/2020 0049   ALT  16 02/08/2020 0049   ALKPHOS 88 02/08/2020 0049   BILITOT 1.2 02/08/2020 0049   BILITOT 0.7 06/29/2019 1157   GFRNONAA >60 02/10/2020 0643   GFRNONAA 71 03/14/2014 1146   GFRAA 78 06/29/2019 1157   GFRAA 81 03/14/2014 1146   Lab Results  Component Value Date   CHOL 205 (H) 06/29/2019   HDL 45 06/29/2019   LDLCALC 110 (H) 06/29/2019   TRIG 292 (H) 06/29/2019   CHOLHDL 4.6 (H) 06/29/2019   Lab Results  Component Value Date   HGBA1C 6.1 (H) 03/27/2019  No results found for: VITAMINB12 Lab Results  Component Value Date   TSH 2.170 03/27/2019      ASSESSMENT AND PLAN 65 y.o. year old female  has a past medical history of Depression, Hypertension, and [name]. here with:  1. [name]  - CPAP compliance excellent - Good treatment of AHI  - Encourage patient to use CPAP nightly and > 4 hours each night - F/U in 1 year or sooner if needed   I spent 20 minutes of face-to-face and non-face-to-face time with patient.  This included previsit chart review, lab review, study review, order entry, electronic health record documentation, patient education.  [name], MSN, NP-C 05/06/2020, 9:20 AM [facility] [address], [place], [place] [address] [phone]

## 2020-05-13 ENCOUNTER — Other Ambulatory Visit: Payer: Self-pay | Admitting: Neurology

## 2020-05-13 DIAGNOSIS — G4733 Obstructive sleep apnea (adult) (pediatric): Secondary | ICD-10-CM

## 2020-07-02 DIAGNOSIS — L819 Disorder of pigmentation, unspecified: Secondary | ICD-10-CM | POA: Diagnosis not present

## 2020-07-02 DIAGNOSIS — G4733 Obstructive sleep apnea (adult) (pediatric): Secondary | ICD-10-CM | POA: Diagnosis not present

## 2020-07-02 DIAGNOSIS — E876 Hypokalemia: Secondary | ICD-10-CM | POA: Diagnosis not present

## 2020-07-02 DIAGNOSIS — I10 Essential (primary) hypertension: Secondary | ICD-10-CM | POA: Diagnosis not present

## 2020-07-02 DIAGNOSIS — E785 Hyperlipidemia, unspecified: Secondary | ICD-10-CM | POA: Diagnosis not present

## 2020-07-02 DIAGNOSIS — Z0000 Encounter for general adult medical examination without abnormal findings: Secondary | ICD-10-CM | POA: Diagnosis not present

## 2020-07-02 DIAGNOSIS — R7303 Prediabetes: Secondary | ICD-10-CM | POA: Diagnosis not present

## 2020-07-15 DIAGNOSIS — I10 Essential (primary) hypertension: Secondary | ICD-10-CM | POA: Diagnosis not present

## 2020-07-15 DIAGNOSIS — K5792 Diverticulitis of intestine, part unspecified, without perforation or abscess without bleeding: Secondary | ICD-10-CM | POA: Diagnosis not present

## 2020-07-15 DIAGNOSIS — M1611 Unilateral primary osteoarthritis, right hip: Secondary | ICD-10-CM | POA: Diagnosis not present

## 2020-07-15 DIAGNOSIS — M25551 Pain in right hip: Secondary | ICD-10-CM | POA: Diagnosis not present

## 2020-07-15 DIAGNOSIS — E785 Hyperlipidemia, unspecified: Secondary | ICD-10-CM | POA: Diagnosis not present

## 2020-07-15 DIAGNOSIS — Z9889 Other specified postprocedural states: Secondary | ICD-10-CM | POA: Diagnosis not present

## 2020-07-15 DIAGNOSIS — Z79899 Other long term (current) drug therapy: Secondary | ICD-10-CM | POA: Diagnosis not present

## 2020-07-24 DIAGNOSIS — Z780 Asymptomatic menopausal state: Secondary | ICD-10-CM | POA: Diagnosis not present

## 2020-07-24 DIAGNOSIS — Z1212 Encounter for screening for malignant neoplasm of rectum: Secondary | ICD-10-CM | POA: Diagnosis not present

## 2020-07-24 DIAGNOSIS — E876 Hypokalemia: Secondary | ICD-10-CM | POA: Diagnosis not present

## 2020-07-24 DIAGNOSIS — Z1211 Encounter for screening for malignant neoplasm of colon: Secondary | ICD-10-CM | POA: Diagnosis not present

## 2020-07-24 DIAGNOSIS — M25559 Pain in unspecified hip: Secondary | ICD-10-CM | POA: Diagnosis not present

## 2020-07-24 DIAGNOSIS — I10 Essential (primary) hypertension: Secondary | ICD-10-CM | POA: Diagnosis not present

## 2020-08-08 DIAGNOSIS — Z1231 Encounter for screening mammogram for malignant neoplasm of breast: Secondary | ICD-10-CM | POA: Diagnosis not present

## 2020-11-23 DIAGNOSIS — Z1211 Encounter for screening for malignant neoplasm of colon: Secondary | ICD-10-CM | POA: Diagnosis not present

## 2020-11-23 DIAGNOSIS — Z1212 Encounter for screening for malignant neoplasm of rectum: Secondary | ICD-10-CM | POA: Diagnosis not present

## 2020-11-29 LAB — COLOGUARD: COLOGUARD: NEGATIVE

## 2021-01-08 ENCOUNTER — Emergency Department (HOSPITAL_COMMUNITY): Payer: Medicare Other

## 2021-01-08 ENCOUNTER — Encounter (HOSPITAL_COMMUNITY): Payer: Self-pay

## 2021-01-08 ENCOUNTER — Emergency Department (HOSPITAL_COMMUNITY)
Admission: EM | Admit: 2021-01-08 | Discharge: 2021-01-08 | Disposition: A | Discharge status: Home / Self Care | Payer: Medicare Other | Attending: Emergency Medicine | Admitting: Emergency Medicine

## 2021-01-08 ENCOUNTER — Other Ambulatory Visit: Payer: Self-pay

## 2021-01-08 DIAGNOSIS — I10 Essential (primary) hypertension: Secondary | ICD-10-CM | POA: Diagnosis not present

## 2021-01-08 DIAGNOSIS — Z87891 Personal history of nicotine dependence: Secondary | ICD-10-CM | POA: Insufficient documentation

## 2021-01-08 DIAGNOSIS — R509 Fever, unspecified: Secondary | ICD-10-CM | POA: Diagnosis not present

## 2021-01-08 DIAGNOSIS — Z7982 Long term (current) use of aspirin: Secondary | ICD-10-CM | POA: Insufficient documentation

## 2021-01-08 DIAGNOSIS — R059 Cough, unspecified: Secondary | ICD-10-CM | POA: Diagnosis not present

## 2021-01-08 DIAGNOSIS — U071 COVID-19: Secondary | ICD-10-CM | POA: Insufficient documentation

## 2021-01-08 LAB — CBC WITH DIFFERENTIAL/PLATELET
Abs Immature Granulocytes: 0.02 10*3/uL (ref 0.00–0.07)
Basophils Absolute: 0 10*3/uL (ref 0.0–0.1)
Basophils Relative: 0 %
Eosinophils Absolute: 0 10*3/uL (ref 0.0–0.5)
Eosinophils Relative: 1 %
HCT: 35.1 % — ABNORMAL LOW (ref 36.0–46.0)
Hemoglobin: 12.3 g/dL (ref 12.0–15.0)
Immature Granulocytes: 0 %
Lymphocytes Relative: 10 %
Lymphs Abs: 0.6 10*3/uL — ABNORMAL LOW (ref 0.7–4.0)
MCH: 30.5 pg (ref 26.0–34.0)
MCHC: 35 g/dL (ref 30.0–36.0)
MCV: 87.1 fL (ref 80.0–100.0)
Monocytes Absolute: 0.7 10*3/uL (ref 0.1–1.0)
Monocytes Relative: 14 %
Neutro Abs: 4 10*3/uL (ref 1.7–7.7)
Neutrophils Relative %: 75 %
Platelets: 233 10*3/uL (ref 150–400)
RBC: 4.03 MIL/uL (ref 3.87–5.11)
RDW: 13.7 % (ref 11.5–15.5)
WBC: 5.4 10*3/uL (ref 4.0–10.5)
nRBC: 0 % (ref 0.0–0.2)

## 2021-01-08 LAB — BASIC METABOLIC PANEL
Anion gap: 10 (ref 5–15)
BUN: 12 mg/dL (ref 8–23)
CO2: 23 mmol/L (ref 22–32)
Calcium: 8.6 mg/dL — ABNORMAL LOW (ref 8.9–10.3)
Chloride: 102 mmol/L (ref 98–111)
Creatinine, Ser: 0.88 mg/dL (ref 0.44–1.00)
GFR, Estimated: >60 mL/min (ref >60)
Glucose, Bld: 149 mg/dL — ABNORMAL HIGH (ref 70–99)
Potassium: 3.2 mmol/L — ABNORMAL LOW (ref 3.5–5.1)
Sodium: 135 mmol/L (ref 135–145)

## 2021-01-08 LAB — RESP PANEL BY RT-PCR (FLU A&B, COVID) ARPGX2
Influenza A by PCR: NEGATIVE
Influenza B by PCR: NEGATIVE
SARS Coronavirus 2 by RT PCR: POSITIVE — AB

## 2021-01-08 MED ORDER — ACETAMINOPHEN 325 MG PO TABS
650.0000 mg | ORAL_TABLET | Freq: Once | ORAL | Status: AC
  Administered 2021-01-08: 20:00:00 650 mg via ORAL
  Filled 2021-01-08: qty 2

## 2021-01-08 MED ORDER — ONDANSETRON 4 MG PO TBDP
4.0000 mg | ORAL_TABLET | Freq: Once | ORAL | Status: AC
  Administered 2021-01-08: 21:00:00 4 mg via ORAL
  Filled 2021-01-08: qty 1

## 2021-01-08 MED ORDER — NIRMATRELVIR/RITONAVIR (PAXLOVID)TABLET: 3.0000 | ORAL_TABLET | Freq: Two times a day (BID) | ORAL | 0 refills | Status: AC

## 2021-01-08 NOTE — ED Provider Notes (Signed)
[facility] EMERGENCY DEPARTMENT Provider Note   CSN: [id number] Arrival date & time: 01/08/21  1912     History Chief Complaint  Patient presents with   Headache    nausea   Fever    TWANISHA FOULK is a 65 y.o. female who presents to the emergency department with flulike symptoms that began yesterday.  Patient states that some of her coworkers were sick with similar symptoms.  Patient reports associated cough characterizes nonproductive, general malaise, myalgias, headache, and nausea.  Patient states that her headache is improved with ibuprofen.  She also has been having fevers.  No chest pain or shortness of breath.  No abdominal pain or diarrhea.  No sore throat.  She rates her symptoms moderate in severity.   Headache Associated symptoms: fever   Fever Associated symptoms: headaches       Past Medical History:  Diagnosis Date   Depression    Hypertension    [name]     Patient Active Problem List   Diagnosis Date Noted   Perforated diverticulum 02/08/2020   Pneumoperitoneum 02/08/2020   HLD (hyperlipidemia) 02/08/2020   Pure hypercholesterolemia 10/02/2019   Prediabetes 10/02/2019   Hypokalemia 03/29/2019   Acute appendicitis 09/26/2018   Obstructive sleep apnea treated with continuous positive airway pressure (CPAP) 08/01/2017   Inadequate sleep hygiene 02/22/2017   Excessive daytime sleepiness 02/22/2017   Sleep deprivation 02/22/2017   Snoring 02/22/2017   OSA (obstructive sleep apnea) 02/22/2017   HTN (hypertension) 02/23/2012   Anxiety and depression 02/23/2012    Past Surgical History:  Procedure Laterality Date   APPENDECTOMY  09/26/2018   HYSTERECTOMY ABDOMINAL WITH SALPINGO-OOPHORECTOMY  2000   DUB   JOINT REPLACEMENT     LAPAROSCOPIC APPENDECTOMY N/A 09/26/2018   Procedure: APPENDECTOMY LAPAROSCOPIC;  Surgeon: Mickeal Skinner, MD;  Location: [facility];  Service: General;  Laterality: N/A;   SHOULDER SURGERY Left    01-02-2018, has had R  shoulder rotator cuff previously     OB History   No obstetric history on file.     Family History  Problem Relation Age of Onset   Hypertension Father    Heart attack Father     Social History   Tobacco Use   Smoking status: Former    Packs/day: 3.00    Years: 20.00    Pack years: 60.00    Types: Cigarettes    Quit date: 2000    Years since quitting: 22.9   Smokeless tobacco: Never  Vaping Use   Vaping Use: Never used  Substance Use Topics   Alcohol use: No    Alcohol/week: 0.0 standard drinks   Drug use: No    Home Medications Prior to Admission medications   Medication Sig Start Date End Date Taking? Authorizing Provider  nirmatrelvir/ritonavir EUA (PAXLOVID) 20 x 150 MG & 10 x 100MG  TABS Take 3 tablets by mouth 2 (two) times daily for 5 days. Patient GFR is >60. Take nirmatrelvir (150 mg) two tablets twice daily for 5 days and ritonavir (100 mg) one tablet twice daily for 5 days. 01/08/21 01/13/21 Yes Myna Bright M, PA-C  aspirin 81 MG tablet You can restart your baby aspirin 1 daily 09/28/2018. Patient taking differently: Take 81 mg by mouth at bedtime. 09/26/18   Earnstine Regal, PA-C  atorvastatin (LIPITOR) 20 MG tablet Take 1 tablet (20 mg total) by mouth daily. Patient taking differently: Take 20 mg by mouth at bedtime. 10/02/19   Daleen Squibb, MD  chlorthalidone (HYGROTON) 25 MG tablet Take 25 mg by mouth daily. 03/22/20   [provider]  FLUoxetine (PROZAC) 20 MG capsule Take 3 capsules (60 mg total) by mouth daily. Patient taking differently: Take 60 mg by mouth at bedtime. 10/02/19   Daleen Squibb, MD  vitamin B-12 (CYANOCOBALAMIN) 1000 MCG tablet Take 1,000 mcg by mouth at bedtime.    [provider]    Allergies    Patient has no known allergies.  Review of Systems   Review of Systems  Constitutional:  Positive for fever.  Neurological:  Positive for headaches.  All other systems reviewed and are  negative.  Physical Exam Updated Vital Signs BP 138/76 (BP Location: Right Arm)    Pulse 86    Temp 99 F (37.2 C)    Resp 17    Ht 5\' 1"  (1.549 m)    Wt 74.8 kg    SpO2 91%    BMI 31.18 kg/m   Physical Exam Vitals and nursing note reviewed.  Constitutional:      General: She is not in acute distress.    Appearance: Normal appearance.  HENT:     Head: Normocephalic and atraumatic.  Eyes:     General:        Right eye: No discharge.        Left eye: No discharge.  Cardiovascular:     Comments: Regular rate and rhythm.  S1/S2 are distinct without any evidence of murmur, rubs, or gallops.  Radial pulses are 2+ bilaterally.  Dorsalis pedis pulses are 2+ bilaterally.  No evidence of pedal edema. Pulmonary:     Comments: Clear to auscultation bilaterally.  Normal effort.  No respiratory distress.  No evidence of wheezes, rales, or rhonchi heard throughout. Abdominal:     General: Abdomen is flat. Bowel sounds are normal. There is no distension.     Tenderness: There is no abdominal tenderness. There is no guarding or rebound.  Musculoskeletal:        General: Normal range of motion.     Cervical back: Neck supple.  Skin:    General: Skin is warm and dry.     Findings: No rash.  Neurological:     General: No focal deficit present.     Mental Status: She is alert.  Psychiatric:        Mood and Affect: Mood normal.        Behavior: Behavior normal.    ED Results / Procedures / Treatments   Labs (all labs ordered are listed, but only abnormal results are displayed) Labs Reviewed  RESP PANEL BY RT-PCR (FLU A&B, COVID) ARPGX2 - Abnormal; Notable for the following components:      Result Value   SARS Coronavirus 2 by RT PCR POSITIVE (*)    All other components within normal limits  BASIC METABOLIC PANEL - Abnormal; Notable for the following components:   Potassium 3.2 (*)    Glucose, Bld 149 (*)    Calcium 8.6 (*)    All other components within normal limits  CBC WITH  DIFFERENTIAL/PLATELET - Abnormal; Notable for the following components:   HCT 35.1 (*)    Lymphs Abs 0.6 (*)    All other components within normal limits    EKG None  Radiology DG Chest Portable 1 View  Result Date: 01/08/2021 CLINICAL DATA:  Cough with fever and body aches. EXAM: PORTABLE CHEST 1 VIEW COMPARISON:  03/08/2016 and 11/11/2009 radiographs. FINDINGS: 1953 hours. The heart  size and mediastinal contours are stable with aortic atherosclerosis. There is diffuse central airway thickening with increased streaky opacities at both lung bases, similar to those seen on abdominal CT 02/10/2020. No confluent airspace opacity, pleural effusion or pneumothorax. IMPRESSION: Chronic central airway thickening with linear bibasilar atelectasis or scarring. No evidence of pneumonia. Electronically Signed   By: Richardean Sale M.D.   On: 01/08/2021 20:00    Procedures Procedures   Medications Ordered in ED Medications  acetaminophen (TYLENOL) tablet 650 mg (650 mg Oral Given 01/08/21 1949)  ondansetron (ZOFRAN-ODT) disintegrating tablet 4 mg (4 mg Oral Given 01/08/21 2034)    ED Course  I have reviewed the triage vital signs and the nursing notes.  Pertinent labs & imaging results that were available during my care of the patient were reviewed by me and considered in my medical decision making (see chart for details).    MDM Rules/Calculators/A&P                           JAMANI ELEY is a 65 y.o. female who presents the emergency department for for evaluation of flulike symptoms.  Patient is in no acute distress but is febrile.  We will give her Tylenol.  I am suspicious that this is a viral illness.  She did have positive for COVID.  Chest x-ray was negative for any overlying pneumonia.  Patient does qualify for Paxlovid. I discussed this medication with the patient at length.  I will have her stop her Lipitor and restarted on the fourth day after finishing Paxil of it.  All question and  concerns addressed.  Strict return precautions given.  She is safe for discharge.   Final Clinical Impression(s) / ED Diagnoses Final diagnoses:  COVID    Rx / DC Orders ED Discharge Orders          Ordered    nirmatrelvir/ritonavir EUA (PAXLOVID) 20 x 150 MG & 10 x 100MG  TABS  2 times daily        01/08/21 2232             Cherrie Gauze 01/08/21 2249    Luna Fuse, MD 01/15/21 [phone]

## 2021-01-08 NOTE — ED Notes (Signed)
PCXR done

## 2021-01-08 NOTE — Discharge Instructions (Addendum)
You tested positive for COVID today.  You qualify for paxlovid which is a treatment option for COVID.  I have prescribed this for you today.  Please stop taking Lipitor your cholesterol medication.  You can restart taking it 4 days after you complete paxlovid.   Please drink plenty of fluids and get plenty of rest.  This will resolve on its own.  Please return to the emergency department if you experience worsening symptoms, severe and worsening cough, intractable vomiting/diarrhea, or any other concerns you might have.

## 2021-01-08 NOTE — ED Triage Notes (Signed)
Pt reports headache, cough, and fever that started yesterday. Pt reports nausea with no vomiting, pt reports taking ibuprofen about 3 or 4 hours ago, says it helped headache a little bit.

## 2021-01-09 DIAGNOSIS — G4733 Obstructive sleep apnea (adult) (pediatric): Secondary | ICD-10-CM | POA: Diagnosis not present

## 2021-05-06 ENCOUNTER — Encounter: Payer: Self-pay | Admitting: Adult Health

## 2021-05-06 ENCOUNTER — Ambulatory Visit: Payer: Medicare HMO | Admitting: Adult Health

## 2021-05-06 VITALS — BP 153/79 | HR 60 | Ht 61.0 in | Wt 189.0 lb

## 2021-05-06 DIAGNOSIS — G4733 Obstructive sleep apnea (adult) (pediatric): Secondary | ICD-10-CM

## 2021-05-06 DIAGNOSIS — Z9989 Dependence on other enabling machines and devices: Secondary | ICD-10-CM

## 2021-05-06 NOTE — Patient Instructions (Signed)
Continue using CPAP nightly and greater than 4 hours each night °If your symptoms worsen or you develop new symptoms please let us know.  ° °

## 2021-05-06 NOTE — Progress Notes (Signed)
? ? ?PATIENT: April Stevens ?DOB: [birth year] ? ?REASON FOR VISIT: follow up ?HISTORY FROM: patient ?PRIMARY NEUROLOGIST: Dr. Brett Fairy ? ?HISTORY OF PRESENT ILLNESS: ?Today 05/06/21 ? ?April Stevens is a 66 year old female with a history of obstructive sleep apnea on CPAP.  She returns today for follow-up.  She reports that the CPAP is working well.  Her only issue is that she is having to pay for CPAP supplies. ? ? ?REVIEW OF SYSTEMS: Out of a complete 14 system review of symptoms, the patient complains only of the following symptoms, and all other reviewed systems are negative. ? ?FSS 18 ?ESS 6 ? ?ALLERGIES: ?No Known Allergies ? ?HOME MEDICATIONS: ?Outpatient Medications Prior to Visit  ?Medication Sig Dispense Refill  ? aspirin 81 MG tablet You can restart your baby aspirin 1 daily 09/28/2018. (Patient taking differently: Take 81 mg by mouth at bedtime.) 365 tablet 0  ? atorvastatin (LIPITOR) 20 MG tablet Take 1 tablet (20 mg total) by mouth daily. (Patient taking differently: Take 20 mg by mouth at bedtime.) 90 tablet 1  ? chlorthalidone (HYGROTON) 25 MG tablet Take 25 mg by mouth daily.    ? Cyanocobalamin (B-12 PO) Take by mouth daily at 2 PM.    ? FLUoxetine (PROZAC) 20 MG capsule Take 3 capsules (60 mg total) by mouth daily. (Patient taking differently: Take 60 mg by mouth at bedtime.) 270 capsule 1  ? vitamin B-12 (CYANOCOBALAMIN) 1000 MCG tablet Take 1,000 mcg by mouth at bedtime.    ? ?No facility-administered medications prior to visit.  ? ? ?PAST MEDICAL HISTORY: ?Past Medical History:  ?Diagnosis Date  ? Depression   ? Hypertension   ? [name]   ? ? ?PAST SURGICAL HISTORY: ?Past Surgical History:  ?Procedure Laterality Date  ? APPENDECTOMY  09/26/2018  ? HYSTERECTOMY ABDOMINAL WITH SALPINGO-OOPHORECTOMY  2000  ? DUB  ? JOINT REPLACEMENT    ? LAPAROSCOPIC APPENDECTOMY N/A 09/26/2018  ? Procedure: APPENDECTOMY LAPAROSCOPIC;  Surgeon: Kinsinger, Arta Bruce, MD;  Location: [facility];  Service: General;  Laterality:  N/A;  ? SHOULDER SURGERY Left   ? 01-02-2018, has had R shoulder rotator cuff previously  ? ? ?FAMILY HISTORY: ?Family History  ?Problem Relation Age of Onset  ? Hypertension Father   ? Heart attack Father   ? Sleep apnea Neg Hx   ? ? ?SOCIAL HISTORY: ?Social History  ? ?Socioeconomic History  ? Marital status: Single  ?  Spouse name: Not on file  ? Number of children: Not on file  ? Years of education: Not on file  ? Highest education level: Not on file  ?Occupational History  ? Not on file  ?Tobacco Use  ? Smoking status: Former  ?  Packs/day: 3.00  ?  Years: 20.00  ?  Pack years: 60.00  ?  Types: Cigarettes  ?  Quit date: 2000  ?  Years since quitting: 23.3  ? Smokeless tobacco: Never  ?Vaping Use  ? Vaping Use: Never used  ?Substance and Sexual Activity  ? Alcohol use: Yes  ?  Comment: socially  ? Drug use: No  ? Sexual activity: Yes  ?Other Topics Concern  ? Not on file  ?Social History Narrative  ? Not on file  ? ?Social Determinants of Health  ? ?Financial Resource Strain: Not on file  ?Food Insecurity: Not on file  ?Transportation Needs: Not on file  ?Physical Activity: Not on file  ?Stress: Not on file  ?Social Connections: Not on file  ?  Intimate Partner Violence: Not on file  ? ? ? ? ?PHYSICAL EXAM ? ?Vitals:  ? 05/06/21 0833  ?BP: (!) 153/79  ?Pulse: 60  ?Weight: 189 lb (85.7 kg)  ?Height: '5\' 1"'$  (1.549 m)  ? ?Body mass index is 35.71 kg/m?. ? ?Generalized: Well developed, in no acute distress  ?Chest: Lungs clear to auscultation bilaterally ? ?Neurological examination  ?Mentation: Alert oriented to time, place, history taking. Follows all commands speech and language fluent ?Cranial nerve II-XII: Extraocular movements were full, visual field were full on confrontational test. Head turning and shoulder shrug  were normal and symmetric. ?Motor: The motor testing reveals 5 over 5 strength of all 4 extremities. Good symmetric motor tone is noted throughout. ?Gait and station: Gait is normal.  ? ? ?DIAGNOSTIC  DATA (LABS, IMAGING, TESTING) ?- I reviewed patient records, labs, notes, testing and imaging myself where available. ? ?Lab Results  ?Component Value Date  ? WBC 5.4 01/08/2021  ? HGB 12.3 01/08/2021  ? HCT 35.1 (L) 01/08/2021  ? MCV 87.1 01/08/2021  ? PLT 233 01/08/2021  ? ?   ?Component Value Date/Time  ? NA 135 01/08/2021 2012  ? NA 139 06/29/2019 1157  ? K 3.2 (L) 01/08/2021 2012  ? CL 102 01/08/2021 2012  ? CO2 23 01/08/2021 2012  ? GLUCOSE 149 (H) 01/08/2021 2012  ? BUN 12 01/08/2021 2012  ? BUN 12 06/29/2019 1157  ? CREATININE 0.88 01/08/2021 2012  ? CREATININE 0.89 03/14/2014 1146  ? CALCIUM 8.6 (L) 01/08/2021 2012  ? PROT 7.0 02/08/2020 0049  ? PROT 7.2 06/29/2019 1157  ? ALBUMIN 3.7 02/08/2020 0049  ? ALBUMIN 4.3 06/29/2019 1157  ? AST 19 02/08/2020 0049  ? ALT 16 02/08/2020 0049  ? ALKPHOS 88 02/08/2020 0049  ? BILITOT 1.2 02/08/2020 0049  ? BILITOT 0.7 06/29/2019 1157  ? GFRNONAA >60 01/08/2021 2012  ? GFRNONAA 71 03/14/2014 1146  ? GFRAA 78 06/29/2019 1157  ? GFRAA 81 03/14/2014 1146  ? ?Lab Results  ?Component Value Date  ? CHOL 205 (H) 06/29/2019  ? HDL 45 06/29/2019  ? LDLCALC 110 (H) 06/29/2019  ? TRIG 292 (H) 06/29/2019  ? CHOLHDL 4.6 (H) 06/29/2019  ? ?Lab Results  ?Component Value Date  ? HGBA1C 6.1 (H) 03/27/2019  ? ?No results found for: VITAMINB12 ?Lab Results  ?Component Value Date  ? TSH 2.170 03/27/2019  ? ? ? ? ?ASSESSMENT AND PLAN ?66 y.o. year old female  has a past medical history of Depression, Hypertension, and [name]. here with: ? ?[name] ? ?- CPAP compliance excellent ?- Good treatment of AHI  ?- Encourage patient to use CPAP nightly and > 4 hours each night ?-Advised to call DME company about billing issues ?- F/U in 1 year or sooner if needed ? ? ? ? ?[name], MSN, NP-C 05/06/2021, 9:49 AM ?[facility] ?[address], [address] ?[place], [place] [address] ?[phone] ? ?

## 2021-08-10 ENCOUNTER — Encounter (HOSPITAL_COMMUNITY): Payer: Self-pay

## 2021-08-10 ENCOUNTER — Other Ambulatory Visit: Payer: Self-pay

## 2021-08-10 ENCOUNTER — Emergency Department (HOSPITAL_COMMUNITY)
Admission: EM | Admit: 2021-08-10 | Discharge: 2021-08-10 | Disposition: A | Discharge status: Home / Self Care | Payer: Worker's Compensation | Attending: Emergency Medicine | Admitting: Emergency Medicine

## 2021-08-10 ENCOUNTER — Emergency Department (HOSPITAL_COMMUNITY): Payer: Medicare HMO

## 2021-08-10 ENCOUNTER — Emergency Department (HOSPITAL_COMMUNITY): Payer: Worker's Compensation

## 2021-08-10 DIAGNOSIS — Z79899 Other long term (current) drug therapy: Secondary | ICD-10-CM | POA: Diagnosis not present

## 2021-08-10 DIAGNOSIS — W19XXXA Unspecified fall, initial encounter: Secondary | ICD-10-CM | POA: Diagnosis not present

## 2021-08-10 DIAGNOSIS — Z7982 Long term (current) use of aspirin: Secondary | ICD-10-CM | POA: Insufficient documentation

## 2021-08-10 DIAGNOSIS — S52502A Unspecified fracture of the lower end of left radius, initial encounter for closed fracture: Secondary | ICD-10-CM | POA: Diagnosis not present

## 2021-08-10 DIAGNOSIS — I10 Essential (primary) hypertension: Secondary | ICD-10-CM | POA: Insufficient documentation

## 2021-08-10 DIAGNOSIS — S59912A Unspecified injury of left forearm, initial encounter: Secondary | ICD-10-CM | POA: Diagnosis present

## 2021-08-10 LAB — I-STAT CHEM 8, ED
BUN: 17 mg/dL (ref 8–23)
Calcium, Ion: 1.12 mmol/L — ABNORMAL LOW (ref 1.15–1.40)
Chloride: 103 mmol/L (ref 98–111)
Creatinine, Ser: 0.8 mg/dL (ref 0.44–1.00)
Glucose, Bld: 121 mg/dL — ABNORMAL HIGH (ref 70–99)
HCT: 42 % (ref 36.0–46.0)
Hemoglobin: 14.3 g/dL (ref 12.0–15.0)
Potassium: 2.8 mmol/L — ABNORMAL LOW (ref 3.5–5.1)
Sodium: 140 mmol/L (ref 135–145)
TCO2: 24 mmol/L (ref 22–32)

## 2021-08-10 MED ORDER — FENTANYL CITRATE PF 50 MCG/ML IJ SOSY
50.0000 ug | PREFILLED_SYRINGE | INTRAMUSCULAR | Status: DC | PRN
  Administered 2021-08-10 (×3): 50 ug via INTRAVENOUS
  Filled 2021-08-10 (×3): qty 1

## 2021-08-10 MED ORDER — HYDROCODONE-ACETAMINOPHEN 5-325 MG PO TABS
1.0000 | ORAL_TABLET | Freq: Once | ORAL | Status: AC
  Administered 2021-08-10: 1 via ORAL
  Filled 2021-08-10: qty 1

## 2021-08-10 MED ORDER — HYDROCODONE-ACETAMINOPHEN 5-325 MG PO TABS: 1.0000 | ORAL_TABLET | Freq: Four times a day (QID) | ORAL | 0 refills | Status: DC | PRN

## 2021-08-10 MED ORDER — ETOMIDATE 2 MG/ML IV SOLN
10.0000 mg | Freq: Once | INTRAVENOUS | Status: AC
  Administered 2021-08-10: 10 mg via INTRAVENOUS
  Filled 2021-08-10: qty 10

## 2021-08-10 MED ORDER — POTASSIUM CHLORIDE CRYS ER 20 MEQ PO TBCR: 40.0000 meq | EXTENDED_RELEASE_TABLET | Freq: Once | ORAL | 0 refills | Status: DC

## 2021-08-10 NOTE — ED Notes (Signed)
Spoke with Dr. Tempie Donning. He states that pt can go home without the CT read.

## 2021-08-10 NOTE — ED Provider Notes (Signed)
April Stevens Provider Note   CSN: [id number] Arrival date & time: 08/10/21  1253     History  Chief Complaint  Patient presents with   Lytle Michaels    April Stevens is a 66 y.o. female.  HPI Patient presents after a fall.  Medical history includes HTN, anxiety, depression, OSA, prediabetes, HLD.  Fall occurred shortly prior to arrival.  At the time, patient was at work.  She was standing on a pallet and lost her balance.  When she fell, she fell onto an outstretched left arm.  She experienced severe pain in the area of her left wrist and hand.  She arrives via EMS.  She reports that she did not receive pain medication prior to arrival.  She denies any other areas of discomfort or suspected injury.  She is not on any blood thinning medications.    Home Medications Prior to Admission medications   Medication Sig Start Date End Date Taking? Authorizing Provider  aspirin 81 MG tablet You can restart your baby aspirin 1 daily 09/28/2018. Patient taking differently: Take 81 mg by mouth at bedtime. 09/26/18   Earnstine Regal, PA-C  atorvastatin (LIPITOR) 20 MG tablet Take 1 tablet (20 mg total) by mouth daily. Patient taking differently: Take 20 mg by mouth at bedtime. 10/02/19   Daleen Squibb, MD  chlorthalidone (HYGROTON) 25 MG tablet Take 25 mg by mouth daily. 03/22/20   [provider]  Cyanocobalamin (B-12 PO) Take by mouth daily at 2 PM.    [provider]  FLUoxetine (PROZAC) 20 MG capsule Take 3 capsules (60 mg total) by mouth daily. Patient taking differently: Take 60 mg by mouth at bedtime. 10/02/19   Daleen Squibb, MD  vitamin B-12 (CYANOCOBALAMIN) 1000 MCG tablet Take 1,000 mcg by mouth at bedtime.    [provider]      Allergies    Patient has no known allergies.    Review of Systems   Review of Systems  Musculoskeletal:  Positive for arthralgias.  All other systems reviewed and are  negative.   Physical Exam Updated Vital Signs BP (!) 158/69   Pulse 71   Temp 98.6 F (37 C) (Oral)   Resp 11   SpO2 98%  Physical Exam Vitals and nursing note reviewed.  Constitutional:      General: She is not in acute distress.    Appearance: Normal appearance. She is well-developed. She is not ill-appearing, toxic-appearing or diaphoretic.  HENT:     Head: Normocephalic and atraumatic.     Right Ear: External ear normal.     Left Ear: External ear normal.     Nose: Nose normal.     Mouth/Throat:     Mouth: Mucous membranes are moist.     Pharynx: Oropharynx is clear.  Eyes:     Extraocular Movements: Extraocular movements intact.     Conjunctiva/sclera: Conjunctivae normal.  Cardiovascular:     Rate and Rhythm: Normal rate and regular rhythm.     Heart sounds: No murmur heard. Pulmonary:     Effort: Pulmonary effort is normal. No respiratory distress.  Abdominal:     General: There is no distension.     Palpations: Abdomen is soft.     Tenderness: There is no abdominal tenderness.  Musculoskeletal:        General: Swelling, tenderness, deformity and signs of injury present.     Cervical back: Normal range of motion and neck  supple. No rigidity.     Right lower leg: No edema.     Left lower leg: No edema.  Skin:    General: Skin is warm and dry.     Capillary Refill: Capillary refill takes less than 2 seconds.     Coloration: Skin is not jaundiced or pale.  Neurological:     General: No focal deficit present.     Mental Status: She is alert and oriented to person, place, and time.     Cranial Nerves: No cranial nerve deficit.     Sensory: No sensory deficit.     Motor: No weakness.     Coordination: Coordination normal.  Psychiatric:        Mood and Affect: Mood normal.        Behavior: Behavior normal.        Thought Content: Thought content normal.        Judgment: Judgment normal.     ED Results / Procedures / Treatments   Labs (all labs ordered are  listed, but only abnormal results are displayed) Labs Reviewed - No data to display  EKG None  Radiology DG Wrist Complete Left  Result Date: 08/10/2021 CLINICAL DATA:  Fall with pain and deformity to left wrist EXAM: LEFT WRIST - COMPLETE 3+ VIEW; LEFT HAND - COMPLETE 3+ VIEW COMPARISON:  None Available. FINDINGS: Wrist: There is comminuted and impacted fracture of the distal radius with intra-articular extension. There is approximately 6 mm distal distraction of the distal fragment on the lateral projection. Radiocarpal alignment is maintained. There is marked overlying soft tissue swelling. The pisiform appears abnormally located distal to the ulna and more laterally than expected raising suspicion for dislocation. The distal radioulnar joint also appears widened. Hand: There is no other acute fracture. Alignment of the bones of the hands is normal. IMPRESSION: 1. Impacted and comminuted distal radial fracture with intra-articular extension. 2. Suspected pisiform dislocation. 3. Distal radioulnar joint widening/subluxation. Electronically Signed   By: Valetta Mole M.D.   On: 08/10/2021 13:48   DG Hand Complete Left  Result Date: 08/10/2021 CLINICAL DATA:  Fall with pain and deformity to left wrist EXAM: LEFT WRIST - COMPLETE 3+ VIEW; LEFT HAND - COMPLETE 3+ VIEW COMPARISON:  None Available. FINDINGS: Wrist: There is comminuted and impacted fracture of the distal radius with intra-articular extension. There is approximately 6 mm distal distraction of the distal fragment on the lateral projection. Radiocarpal alignment is maintained. There is marked overlying soft tissue swelling. The pisiform appears abnormally located distal to the ulna and more laterally than expected raising suspicion for dislocation. The distal radioulnar joint also appears widened. Hand: There is no other acute fracture. Alignment of the bones of the hands is normal. IMPRESSION: 1. Impacted and comminuted distal radial fracture  with intra-articular extension. 2. Suspected pisiform dislocation. 3. Distal radioulnar joint widening/subluxation. Electronically Signed   By: Valetta Mole M.D.   On: 08/10/2021 13:48    Procedures Procedures    Medications Ordered in ED Medications  fentaNYL (SUBLIMAZE) injection 50 mcg (50 mcg Intravenous Given 08/10/21 1348)  etomidate (AMIDATE) injection 10 mg (has no administration in time range)    ED Course/ Medical Decision Making/ A&P                           Medical Decision Making Amount and/or Complexity of Data Reviewed Radiology: ordered.  Risk Prescription drug management.   This patient presents to the  ED for concern of fall, this involves an extensive number of treatment options, and is a complaint that carries with it a high risk of complications and morbidity.  The differential diagnosis includes left wrist injury, left hand injury   Co morbidities that complicate the patient evaluation  HTN, anxiety, depression, OSA, prediabetes, HLD   Additional history obtained:  Additional history obtained from N/A External records from outside source obtained and reviewed including EMR   Imaging Studies ordered:  I ordered imaging studies including x-ray of left wrist and hand I independently visualized and interpreted imaging which showed impacted and comminuted distal radial fracture with suspected pisiform dislocation I agree with the radiologist interpretation   Cardiac Monitoring: / EKG:  The patient was maintained on a cardiac monitor.  I personally viewed and interpreted the cardiac monitored which showed an underlying rhythm of: Sinus rhythm   Consultations Obtained:  I requested consultation with the hand surgeon,  and discussed lab and imaging findings as well as pertinent plan - they recommend: Dr. Tempie Donning will plan on bedside reduction   Problem List / ED Course / Critical interventions / Medication management  Patient is a pleasant  65 year old female who presents after a [place] injury.  She describes the fall as mechanical and this occurred shortly prior to arrival.  On arrival, she is alert and oriented.  She has pain only in the area of her left wrist.  There is some deformity noted on exam.  Distal hand is neurovascularly intact.  As needed fentanyl was ordered for analgesia.  Patient underwent x-ray imaging of left hand and wrist.  Results showed impacted and comminuted distal radial fracture with suspected pisiform dislocation.  Hand surgery was consulted.  Hand surgery will plan on bedside reduction under procedural sedation.  Care of patient was signed out to oncoming ED provider. I ordered medication including fentanyl for analgesia Reevaluation of the patient after these medicines showed that the patient improved I have reviewed the patients home medicines and have made adjustments as needed   Social Determinants of Health:  Has access to outpatient care        Final Clinical Impression(s) / ED Diagnoses Final diagnoses:  Closed fracture of distal end of left radius, unspecified fracture morphology, initial encounter    Rx / DC Orders ED Discharge Orders     None         Godfrey Pick, MD 08/10/21 1611

## 2021-08-10 NOTE — Consult Note (Signed)
Reason for Consult:Left wrist fx Referring Physician: Godfrey Pick Time called: [id number] Time at bedside: April Stevens is an 66 y.o. female.  HPI: April Stevens was at work unloading a pallet and lost her balance and fell. She put her left arm behind her to break her fall and had immediate pain. She was brought to the ED where workup showed a wrist fx and hand surgery was consulted. She is RHD.  Past Medical History:  Diagnosis Date   Depression    Hypertension    [name]     Past Surgical History:  Procedure Laterality Date   APPENDECTOMY  09/26/2018   HYSTERECTOMY ABDOMINAL WITH SALPINGO-OOPHORECTOMY  2000   DUB   JOINT REPLACEMENT     LAPAROSCOPIC APPENDECTOMY N/A 09/26/2018   Procedure: APPENDECTOMY LAPAROSCOPIC;  Surgeon: Mickeal Skinner, MD;  Location: [facility];  Service: General;  Laterality: N/A;   SHOULDER SURGERY Left    01-02-2018, has had R shoulder rotator cuff previously    Family History  Problem Relation Age of Onset   Hypertension Father    Heart attack Father    Sleep apnea Neg Hx     Social History:  reports that she quit smoking about 23 years ago. Her smoking use included cigarettes. She has a 60.00 pack-year smoking history. She has never used smokeless tobacco. She reports current alcohol use. She reports that she does not use drugs.  Allergies: No Known Allergies  Medications: I have reviewed the patient's current medications.  No results found for this or any previous visit (from the past 48 hour(s)).  DG Wrist Complete Left  Result Date: 08/10/2021 CLINICAL DATA:  Fall with pain and deformity to left wrist EXAM: LEFT WRIST - COMPLETE 3+ VIEW; LEFT HAND - COMPLETE 3+ VIEW COMPARISON:  None Available. FINDINGS: Wrist: There is comminuted and impacted fracture of the distal radius with intra-articular extension. There is approximately 6 mm distal distraction of the distal fragment on the lateral projection. Radiocarpal alignment is maintained. There  is marked overlying soft tissue swelling. The pisiform appears abnormally located distal to the ulna and more laterally than expected raising suspicion for dislocation. The distal radioulnar joint also appears widened. Hand: There is no other acute fracture. Alignment of the bones of the hands is normal. IMPRESSION: 1. Impacted and comminuted distal radial fracture with intra-articular extension. 2. Suspected pisiform dislocation. 3. Distal radioulnar joint widening/subluxation. Electronically Signed   By: Valetta Mole M.D.   On: 08/10/2021 13:48   DG Hand Complete Left  Result Date: 08/10/2021 CLINICAL DATA:  Fall with pain and deformity to left wrist EXAM: LEFT WRIST - COMPLETE 3+ VIEW; LEFT HAND - COMPLETE 3+ VIEW COMPARISON:  None Available. FINDINGS: Wrist: There is comminuted and impacted fracture of the distal radius with intra-articular extension. There is approximately 6 mm distal distraction of the distal fragment on the lateral projection. Radiocarpal alignment is maintained. There is marked overlying soft tissue swelling. The pisiform appears abnormally located distal to the ulna and more laterally than expected raising suspicion for dislocation. The distal radioulnar joint also appears widened. Hand: There is no other acute fracture. Alignment of the bones of the hands is normal. IMPRESSION: 1. Impacted and comminuted distal radial fracture with intra-articular extension. 2. Suspected pisiform dislocation. 3. Distal radioulnar joint widening/subluxation. Electronically Signed   By: Valetta Mole M.D.   On: 08/10/2021 13:48    Review of Systems  HENT:  Negative for ear discharge, ear pain, hearing  loss and tinnitus.   Eyes:  Negative for photophobia and pain.  Respiratory:  Negative for cough and shortness of breath.   Cardiovascular:  Negative for chest pain.  Gastrointestinal:  Negative for abdominal pain, nausea and vomiting.  Genitourinary:  Negative for dysuria, flank pain, frequency and  urgency.  Musculoskeletal:  Positive for arthralgias (Left wrist). Negative for back pain, myalgias and neck pain.  Neurological:  Negative for dizziness and headaches.  Hematological:  Does not bruise/bleed easily.  Psychiatric/Behavioral:  The patient is not nervous/anxious.    Blood pressure 127/60, pulse 72, temperature 98.6 F (37 C), temperature source Oral, resp. rate 16, SpO2 93 %. Physical Exam Constitutional:      General: She is not in acute distress.    Appearance: She is well-developed. She is not diaphoretic.  HENT:     Head: Normocephalic and atraumatic.  Eyes:     General: No scleral icterus.       Right eye: No discharge.        Left eye: No discharge.     Conjunctiva/sclera: Conjunctivae normal.  Cardiovascular:     Rate and Rhythm: Normal rate and regular rhythm.  Pulmonary:     Effort: Pulmonary effort is normal. No respiratory distress.  Musculoskeletal:     Cervical back: Normal range of motion.     Comments: Left shoulder, elbow, wrist, digits- no skin wounds, severe TTP and swelling, no instability, no blocks to motion  Sens  Ax/R/M/U intact  Mot   Ax/ R/ PIN/ M/ AIN/ U intact  Rad 2+  Skin:    General: Skin is warm and dry.  Neurological:     Mental Status: She is alert.  Psychiatric:        Mood and Affect: Mood normal.        Behavior: Behavior normal.     Assessment/Plan: Left wrist fx -- Plan CR by Dr. Tempie Donning with CS. Anticipate discharge after procedure.    Lisette Abu, PA-C Orthopedic Surgery [phone] 08/10/2021, 3:43 PM

## 2021-08-10 NOTE — ED Provider Notes (Addendum)
Patient will be seen by hand surgery.  They are planning a reduction of the [place] form dislocation.  I-STAT lab done before.  Patient has some hypokalemia with potassium of 2.8.  Patient states she is on potassium supplements.  She has normal renal function.  We will give 40 mg of potassium here.  I will do conscious sedation plan is etomidate.  Ortho is planning to use the C arm and then probably follow-up CT afterwards.  After sugar-tong splint.  Reduction and splinting will be done by hand surgery.  I will be doing the conscious sedation.  .Sedation  Date/Time: 08/10/2021 4:34 PM  Performed by: Vanetta Mulders, MD Authorized by: Vanetta Mulders, MD   Consent:    Consent obtained:  Verbal   Consent given by:  Patient   Risks discussed:  Allergic reaction, dysrhythmia, prolonged sedation necessitating reversal, prolonged hypoxia resulting in organ damage, inadequate sedation and respiratory compromise necessitating ventilatory assistance and intubation   Alternatives discussed:  Analgesia without sedation Universal protocol:    Procedure explained and questions answered to patient or proxy's satisfaction: yes     Relevant documents present and verified: yes     Test results available: yes     Imaging studies available: yes     Required blood products, implants, devices, and special equipment available: yes     Site/side marked: yes     Immediately prior to procedure, a time out was called: yes     Patient identity confirmed:  Verbally with patient Indications:    Procedure performed:  Dislocation reduction   Procedure necessitating sedation performed by:  Different physician Pre-sedation assessment:    Time since last food or drink:  12 noon   NPO status caution: urgency dictates proceeding with non-ideal NPO status     ASA classification: class 2 - patient with mild systemic disease     Mouth opening:  3 or more finger widths   Mallampati score:  I - soft palate, uvula, fauces,  pillars visible   Neck mobility: normal     Pre-sedation assessments completed and reviewed: airway patency, cardiovascular function, hydration status, mental status, nausea/vomiting, pain level, respiratory function and temperature     Pre-sedation assessment completed:  08/10/2021 4:36 PM Immediate pre-procedure details:    Reviewed: vital signs     Verified: bag valve mask available, emergency equipment available, intubation equipment available, IV patency confirmed, oxygen available and suction available   Procedure details (see MAR for exact dosages):    Preoxygenation:  Nasal cannula   Sedation:  Etomidate   Intended level of sedation: deep   Analgesia:  Fentanyl   Intra-procedure monitoring:  Blood pressure monitoring, continuous capnometry, frequent LOC assessments, cardiac monitor, continuous pulse oximetry and frequent vital sign checks   Intra-procedure events: none     Total Provider sedation time (minutes):  20 Post-procedure details:    Attendance: Constant attendance by certified staff until patient recovered     Recovery: Patient returned to pre-procedure baseline     Post-sedation assessments completed and reviewed: airway patency, cardiovascular function, mental status, nausea/vomiting, pain level and respiratory function     Patient is stable for discharge or admission: yes     Procedure completion:  Tolerated well, no immediate complications Comments:     Patient tolerated the conscious sedation without any difficulty.  Patient tolerated procedure well.  Now has sugar-tong splint in place.   Vanetta Mulders, MD 08/10/21 1639    Vanetta Mulders, MD 08/11/21 [phone]  Vanetta Mulders, MD 08/11/21 1610    Vanetta Mulders, MD 08/11/21 [phone]

## 2021-08-10 NOTE — ED Triage Notes (Signed)
Pt bib [facility] from work where she fell and tried to catch herself on her left hand. Pt arrives with left wrist deformity in a sling and splint. Pt is AOx4 and denies LOC EMS VSS. PMS distal to injury

## 2021-08-10 NOTE — Consult Note (Signed)
HAND SURGERY CONSULTATION  REQUESTING PHYSICIAN: Fredia Sorrow, MD  Chief Complaint: Left wrist pain  HPI: April Stevens is a 66 y.o. female who presents with a closed left distal radius fracture after a ground level fall.  Patient was unloading some boxes from a pallet and lost her balance.  She landed on her left wrist.  She describes pain in the left wrist that is 7/10 in severity and worse w/ attempted ROM.  She denies pain in the elbow or remainder of the extremity.  She denies numbness or paresthesias in the fingers.  Hand dominance: Right Occupation: Advertising copywriter  Past Medical History:  Diagnosis Date   Depression    Hypertension    [name]    Past Surgical History:  Procedure Laterality Date   APPENDECTOMY  09/26/2018   HYSTERECTOMY ABDOMINAL WITH SALPINGO-OOPHORECTOMY  2000   DUB   JOINT REPLACEMENT     LAPAROSCOPIC APPENDECTOMY N/A 09/26/2018   Procedure: APPENDECTOMY LAPAROSCOPIC;  Surgeon: Kinsinger, Arta Bruce, MD;  Location: [facility];  Service: General;  Laterality: N/A;   SHOULDER SURGERY Left    01-02-2018, has had R shoulder rotator cuff previously   Social History   Socioeconomic History   Marital status: Single    Spouse name: Not on file   Number of children: Not on file   Years of education: Not on file   Highest education level: Not on file  Occupational History   Not on file  Tobacco Use   Smoking status: Former    Packs/day: 3.00    Years: 20.00    Total pack years: 60.00    Types: Cigarettes    Quit date: 2000    Years since quitting: 23.5   Smokeless tobacco: Never  Vaping Use   Vaping Use: Never used  Substance and Sexual Activity   Alcohol use: Yes    Comment: socially   Drug use: No   Sexual activity: Yes  Other Topics Concern   Not on file  Social History Narrative   Not on file   Social Determinants of Health   Financial Resource Strain: Not on file  Food Insecurity: Not on file  Transportation Needs: Not on file   Physical Activity: Not on file  Stress: Not on file  Social Connections: Not on file   Family History  Problem Relation Age of Onset   Hypertension Father    Heart attack Father    Sleep apnea Neg Hx    - negative except otherwise stated in the family history section No Known Allergies Prior to Admission medications   Medication Sig Start Date End Date Taking? Authorizing Provider  aspirin 81 MG tablet You can restart your baby aspirin 1 daily 09/28/2018. Patient taking differently: Take 81 mg by mouth at bedtime. 09/26/18   Earnstine Regal, PA-C  atorvastatin (LIPITOR) 20 MG tablet Take 1 tablet (20 mg total) by mouth daily. Patient taking differently: Take 20 mg by mouth at bedtime. 10/02/19   Daleen Squibb, MD  chlorthalidone (HYGROTON) 25 MG tablet Take 25 mg by mouth daily. 03/22/20   [provider]  Cyanocobalamin (B-12 PO) Take by mouth daily at 2 PM.    [provider]  FLUoxetine (PROZAC) 20 MG capsule Take 3 capsules (60 mg total) by mouth daily. Patient taking differently: Take 60 mg by mouth at bedtime. 10/02/19   Daleen Squibb, MD  vitamin B-12 (CYANOCOBALAMIN) 1000 MCG tablet Take 1,000 mcg by mouth at  bedtime.    [provider]   DG Wrist Complete Left  Result Date: 08/10/2021 CLINICAL DATA:  Fall with pain and deformity to left wrist EXAM: LEFT WRIST - COMPLETE 3+ VIEW; LEFT HAND - COMPLETE 3+ VIEW COMPARISON:  None Available. FINDINGS: Wrist: There is comminuted and impacted fracture of the distal radius with intra-articular extension. There is approximately 6 mm distal distraction of the distal fragment on the lateral projection. Radiocarpal alignment is maintained. There is marked overlying soft tissue swelling. The pisiform appears abnormally located distal to the ulna and more laterally than expected raising suspicion for dislocation. The distal radioulnar joint also appears widened. Hand: There is no other acute fracture.  Alignment of the bones of the hands is normal. IMPRESSION: 1. Impacted and comminuted distal radial fracture with intra-articular extension. 2. Suspected pisiform dislocation. 3. Distal radioulnar joint widening/subluxation. Electronically Signed   By: Valetta Mole M.D.   On: 08/10/2021 13:48   DG Hand Complete Left  Result Date: 08/10/2021 CLINICAL DATA:  Fall with pain and deformity to left wrist EXAM: LEFT WRIST - COMPLETE 3+ VIEW; LEFT HAND - COMPLETE 3+ VIEW COMPARISON:  None Available. FINDINGS: Wrist: There is comminuted and impacted fracture of the distal radius with intra-articular extension. There is approximately 6 mm distal distraction of the distal fragment on the lateral projection. Radiocarpal alignment is maintained. There is marked overlying soft tissue swelling. The pisiform appears abnormally located distal to the ulna and more laterally than expected raising suspicion for dislocation. The distal radioulnar joint also appears widened. Hand: There is no other acute fracture. Alignment of the bones of the hands is normal. IMPRESSION: 1. Impacted and comminuted distal radial fracture with intra-articular extension. 2. Suspected pisiform dislocation. 3. Distal radioulnar joint widening/subluxation. Electronically Signed   By: Valetta Mole M.D.   On: 08/10/2021 13:48     Positive ROS: All other systems have been reviewed and were otherwise negative with the exception of those mentioned in the HPI and as above.  Physical Exam: General: No acute distress, resting comfortably Cardiovascular: BUE warm and well perfused Respiratory: No cyanosis, no use of accessory musculature Skin: No lesions in the area of chief complaint Neurologic: Sensation intact distally Psychiatric: Patient is at baseline mood and affect  Left Upper Extremity  Vascular: All fingertips are pin with good capillary refill.  No edema or atrophy.  No areas of ischemia or ulceration.  Range of Motion: Elbows -  Normal carrying angle with full arc of motion in flexion and extension, full supination and pronation Wrists - Limited by pain Fingers - Limited by wrist pain  Palpation: There was focal swelling and tenderness of the wrist.  No additional areas of pain or discomfort identified.  Sensation: Light tough was intact in both forearms and hands.   Motor: AIN/PIN/U motor function assessment limited by wrist pain     Assessment: 66 yo RHD F w/ closed distal radius fracture with associated pisiform dislocation after a ground level fall.   Plan: Pisiform dislocation reduced under conscious sedation with wrist flexion and manipulation.  Distal radius fracture reduced.  Patient placed in sugar tong splint.  Discussed importance of elevation to help with pain and swelling.  Recommend CT of left wrist for better evaluation of the fracture for surgical planning.   Pt will need follow up this week to discuss operative fixation of her fracture.    Thank you for the consult and the opportunity to see Ms. Doyce Loose, M.D.  [facility] 4:37 PM

## 2021-08-10 NOTE — Discharge Instructions (Addendum)
Keep the splint in place and dry follow-up with Dr. Tempie Donning from orthopedics.  Elevate the arm is much as possible.  Take the hydrocodone as needed for pain.

## 2021-08-14 ENCOUNTER — Encounter (HOSPITAL_BASED_OUTPATIENT_CLINIC_OR_DEPARTMENT_OTHER): Payer: Self-pay | Admitting: Orthopedic Surgery

## 2021-08-14 ENCOUNTER — Other Ambulatory Visit: Payer: Self-pay

## 2021-08-14 ENCOUNTER — Ambulatory Visit (INDEPENDENT_AMBULATORY_CARE_PROVIDER_SITE_OTHER): Payer: Worker's Compensation

## 2021-08-14 ENCOUNTER — Ambulatory Visit (INDEPENDENT_AMBULATORY_CARE_PROVIDER_SITE_OTHER): Payer: Worker's Compensation | Admitting: Orthopedic Surgery

## 2021-08-14 DIAGNOSIS — M25532 Pain in left wrist: Secondary | ICD-10-CM | POA: Diagnosis not present

## 2021-08-14 DIAGNOSIS — S52572A Other intraarticular fracture of lower end of left radius, initial encounter for closed fracture: Secondary | ICD-10-CM

## 2021-08-14 DIAGNOSIS — S52502A Unspecified fracture of the lower end of left radius, initial encounter for closed fracture: Secondary | ICD-10-CM | POA: Insufficient documentation

## 2021-08-14 MED ORDER — TRAMADOL HCL 50 MG PO TABS: 50.0000 mg | ORAL_TABLET | Freq: Four times a day (QID) | ORAL | 0 refills | Status: DC | PRN

## 2021-08-14 NOTE — H&P (View-Only) (Signed)
Office Visit Note   Patient: April Stevens           Date of Birth: 1955-01-28           MRN: [id number] Visit Date: 08/14/2021              Requested by: [name], [name], MD [place],  [place] [address] PCP: [name], [name], MD   Assessment & Plan: Visit Diagnoses:  1. Pain in left wrist   2. Other closed intra-articular fracture of distal end of left radius, initial encounter     Plan: We reviewed patient's x-rays from today which demonstrate a intra-articular comminuted fracture of the distal radius.  The x-rays today do not seem to demonstrate previously seen dislocation of the [place] form.  It seems to be in an anatomic location on these 2 views.  We discussed the nature of distal radius fractures as well as their prognosis and both conservative and surgical treatment options.  Given the degree of comminution, I recommended open reduction internal fixation.  We discussed the use of a volar locking plate with or without bridge plate supplementation.  Discussed that if a bridge plate is used the plate will typically remain in for around 3 months or so and then comes out.  We discussed the role of hand therapy and the expected postoperative course.  We reviewed the risks of surgery including bleeding, infection, damage to neurovascular structures, incomplete symptom relief, stiffness, nonunion, malunion, need for additional procedures.  After discussion patient like to proceed.  We will plan to do this Monday afternoon.  Follow-Up Instructions: No follow-ups on file.   Orders:  Orders Placed This Encounter  Procedures   XR Wrist 2 Views Left   No orders of the defined types were placed in this encounter.     Procedures: No procedures performed   Clinical Data: No additional findings.   Subjective: Chief Complaint  Patient presents with   Left Wrist - Follow-up, Fracture    This is a 66 y.o. RHD female who works at [employer] and  presents with a closed left distal radius fracture after a ground level fall.  Patient was unloading some boxes from a pallet and lost her balance on 08/10/21.  She landed on her left wrist.  There was some concern for a pisiform dislocation based on the x-ray views obtained.  An attempted closed reduction of the pisiform was performed in the ER.  She describes continued pain in the left wrist that is severely affecting her ability to sleep.  She denies any numbness or paresthesias in the hand.     Review of Systems   Objective: Vital Signs: There were no vitals taken for this visit.  Physical Exam Constitutional:      Appearance: Normal appearance.  Cardiovascular:     Rate and Rhythm: Normal rate.     Pulses: Normal pulses.  Pulmonary:     Effort: Pulmonary effort is normal.  Skin:    General: Skin is warm and dry.     Capillary Refill: Capillary refill takes less than 2 seconds.  Neurological:     Mental Status: She is alert.     Left Hand Exam   Other  Erythema: absent Sensation: normal Pulse: present  Comments:  Splint clean and dry.  Mild to moderate swelling of her fingers.       Specialty Comments:  No specialty comments available.  Imaging: No  results found.   PMFS History: Patient Active Problem List   Diagnosis Date Noted   Closed fracture of left distal radius 08/14/2021   Perforated diverticulum 02/08/2020   Pneumoperitoneum 02/08/2020   HLD (hyperlipidemia) 02/08/2020   Pure hypercholesterolemia 10/02/2019   Prediabetes 10/02/2019   Hypokalemia 03/29/2019   Acute appendicitis 09/26/2018   Obstructive sleep apnea treated with continuous positive airway pressure (CPAP) 08/01/2017   Inadequate sleep hygiene 02/22/2017   Excessive daytime sleepiness 02/22/2017   Sleep deprivation 02/22/2017   Snoring 02/22/2017   OSA (obstructive sleep apnea) 02/22/2017   HTN (hypertension) 02/23/2012   Anxiety and depression 02/23/2012   Past Medical  History:  Diagnosis Date   Depression    Hypertension    [name]     Family History  Problem Relation Age of Onset   Hypertension Father    Heart attack Father    Sleep apnea Neg Hx     Past Surgical History:  Procedure Laterality Date   APPENDECTOMY  09/26/2018   HYSTERECTOMY ABDOMINAL WITH SALPINGO-OOPHORECTOMY  2000   DUB   JOINT REPLACEMENT     LAPAROSCOPIC APPENDECTOMY N/A 09/26/2018   Procedure: APPENDECTOMY LAPAROSCOPIC;  Surgeon: Kinsinger, Arta Bruce, MD;  Location: [facility];  Service: General;  Laterality: N/A;   SHOULDER SURGERY Left    01-02-2018, has had R shoulder rotator cuff previously   Social History   Occupational History   Not on file  Tobacco Use   Smoking status: Former    Packs/day: 3.00    Years: 20.00    Total pack years: 60.00    Types: Cigarettes    Quit date: 2000    Years since quitting: 23.5   Smokeless tobacco: Never  Vaping Use   Vaping Use: Never used  Substance and Sexual Activity   Alcohol use: Yes    Comment: socially   Drug use: No   Sexual activity: Yes

## 2021-08-14 NOTE — Progress Notes (Signed)
Office Visit Note   Patient: April Stevens           Date of Birth: Jun 05, 1955           MRN: [id number] Visit Date: 08/14/2021              Requested by: [name], [name], MD [place],  [place] [address] PCP: [name], [name], MD   Assessment & Plan: Visit Diagnoses:  1. Pain in left wrist   2. Other closed intra-articular fracture of distal end of left radius, initial encounter     Plan: We reviewed patient's x-rays from today which demonstrate a intra-articular comminuted fracture of the distal radius.  The x-rays today do not seem to demonstrate previously seen dislocation of the [place] form.  It seems to be in an anatomic location on these 2 views.  We discussed the nature of distal radius fractures as well as their prognosis and both conservative and surgical treatment options.  Given the degree of comminution, I recommended open reduction internal fixation.  We discussed the use of a volar locking plate with or without bridge plate supplementation.  Discussed that if a bridge plate is used the plate will typically remain in for around 3 months or so and then comes out.  We discussed the role of hand therapy and the expected postoperative course.  We reviewed the risks of surgery including bleeding, infection, damage to neurovascular structures, incomplete symptom relief, stiffness, nonunion, malunion, need for additional procedures.  After discussion patient like to proceed.  We will plan to do this Monday afternoon.  Follow-Up Instructions: No follow-ups on file.   Orders:  Orders Placed This Encounter  Procedures   XR Wrist 2 Views Left   No orders of the defined types were placed in this encounter.     Procedures: No procedures performed   Clinical Data: No additional findings.   Subjective: Chief Complaint  Patient presents with   Left Wrist - Follow-up, Fracture    This is a 66 y.o. RHD female who works at [employer] and  presents with a closed left distal radius fracture after a ground level fall.  Patient was unloading some boxes from a pallet and lost her balance on 08/10/21.  She landed on her left wrist.  There was some concern for a pisiform dislocation based on the x-ray views obtained.  An attempted closed reduction of the pisiform was performed in the ER.  She describes continued pain in the left wrist that is severely affecting her ability to sleep.  She denies any numbness or paresthesias in the hand.     Review of Systems   Objective: Vital Signs: There were no vitals taken for this visit.  Physical Exam Constitutional:      Appearance: Normal appearance.  Cardiovascular:     Rate and Rhythm: Normal rate.     Pulses: Normal pulses.  Pulmonary:     Effort: Pulmonary effort is normal.  Skin:    General: Skin is warm and dry.     Capillary Refill: Capillary refill takes less than 2 seconds.  Neurological:     Mental Status: She is alert.     Left Hand Exam   Other  Erythema: absent Sensation: normal Pulse: present  Comments:  Splint clean and dry.  Mild to moderate swelling of her fingers.       Specialty Comments:  No specialty comments available.  Imaging: No  results found.   PMFS History: Patient Active Problem List   Diagnosis Date Noted   Closed fracture of left distal radius 08/14/2021   Perforated diverticulum 02/08/2020   Pneumoperitoneum 02/08/2020   HLD (hyperlipidemia) 02/08/2020   Pure hypercholesterolemia 10/02/2019   Prediabetes 10/02/2019   Hypokalemia 03/29/2019   Acute appendicitis 09/26/2018   Obstructive sleep apnea treated with continuous positive airway pressure (CPAP) 08/01/2017   Inadequate sleep hygiene 02/22/2017   Excessive daytime sleepiness 02/22/2017   Sleep deprivation 02/22/2017   Snoring 02/22/2017   OSA (obstructive sleep apnea) 02/22/2017   HTN (hypertension) 02/23/2012   Anxiety and depression 02/23/2012   Past Medical  History:  Diagnosis Date   Depression    Hypertension    [name]     Family History  Problem Relation Age of Onset   Hypertension Father    Heart attack Father    Sleep apnea Neg Hx     Past Surgical History:  Procedure Laterality Date   APPENDECTOMY  09/26/2018   HYSTERECTOMY ABDOMINAL WITH SALPINGO-OOPHORECTOMY  2000   DUB   JOINT REPLACEMENT     LAPAROSCOPIC APPENDECTOMY N/A 09/26/2018   Procedure: APPENDECTOMY LAPAROSCOPIC;  Surgeon: Kinsinger, Arta Bruce, MD;  Location: [facility];  Service: General;  Laterality: N/A;   SHOULDER SURGERY Left    01-02-2018, has had R shoulder rotator cuff previously   Social History   Occupational History   Not on file  Tobacco Use   Smoking status: Former    Packs/day: 3.00    Years: 20.00    Total pack years: 60.00    Types: Cigarettes    Quit date: 2000    Years since quitting: 23.5   Smokeless tobacco: Never  Vaping Use   Vaping Use: Never used  Substance and Sexual Activity   Alcohol use: Yes    Comment: socially   Drug use: No   Sexual activity: Yes

## 2021-08-17 ENCOUNTER — Ambulatory Visit (HOSPITAL_BASED_OUTPATIENT_CLINIC_OR_DEPARTMENT_OTHER)
Admission: RE | Admit: 2021-08-17 | Discharge: 2021-08-17 | Disposition: A | Discharge status: Home / Self Care | Payer: Worker's Compensation | Source: Ambulatory Visit | Attending: Orthopedic Surgery | Admitting: Orthopedic Surgery

## 2021-08-17 ENCOUNTER — Encounter (HOSPITAL_BASED_OUTPATIENT_CLINIC_OR_DEPARTMENT_OTHER): Payer: Self-pay | Admitting: Orthopedic Surgery

## 2021-08-17 ENCOUNTER — Encounter (HOSPITAL_BASED_OUTPATIENT_CLINIC_OR_DEPARTMENT_OTHER)
Admission: RE | Disposition: A | Discharge status: Home / Self Care | Payer: Self-pay | Source: Ambulatory Visit | Attending: Orthopedic Surgery

## 2021-08-17 ENCOUNTER — Ambulatory Visit (HOSPITAL_BASED_OUTPATIENT_CLINIC_OR_DEPARTMENT_OTHER): Payer: Worker's Compensation | Admitting: Anesthesiology

## 2021-08-17 ENCOUNTER — Other Ambulatory Visit: Payer: Self-pay

## 2021-08-17 ENCOUNTER — Ambulatory Visit (HOSPITAL_BASED_OUTPATIENT_CLINIC_OR_DEPARTMENT_OTHER): Payer: Worker's Compensation

## 2021-08-17 DIAGNOSIS — S52501A Unspecified fracture of the lower end of right radius, initial encounter for closed fracture: Secondary | ICD-10-CM | POA: Diagnosis not present

## 2021-08-17 DIAGNOSIS — X58XXXA Exposure to other specified factors, initial encounter: Secondary | ICD-10-CM | POA: Diagnosis not present

## 2021-08-17 DIAGNOSIS — F418 Other specified anxiety disorders: Secondary | ICD-10-CM

## 2021-08-17 DIAGNOSIS — I10 Essential (primary) hypertension: Secondary | ICD-10-CM | POA: Diagnosis not present

## 2021-08-17 DIAGNOSIS — E669 Obesity, unspecified: Secondary | ICD-10-CM | POA: Diagnosis not present

## 2021-08-17 DIAGNOSIS — S52572A Other intraarticular fracture of lower end of left radius, initial encounter for closed fracture: Secondary | ICD-10-CM | POA: Diagnosis not present

## 2021-08-17 DIAGNOSIS — Z87891 Personal history of nicotine dependence: Secondary | ICD-10-CM | POA: Insufficient documentation

## 2021-08-17 HISTORY — PX: OPEN REDUCTION INTERNAL FIXATION (ORIF) DISTAL RADIAL FRACTURE: SHX5989

## 2021-08-17 LAB — BASIC METABOLIC PANEL
Anion gap: 10 (ref 5–15)
BUN: 13 mg/dL (ref 8–23)
CO2: 27 mmol/L (ref 22–32)
Calcium: 9.3 mg/dL (ref 8.9–10.3)
Chloride: 102 mmol/L (ref 98–111)
Creatinine, Ser: 0.83 mg/dL (ref 0.44–1.00)
GFR, Estimated: >60 mL/min (ref >60)
Glucose, Bld: 128 mg/dL — ABNORMAL HIGH (ref 70–99)
Potassium: 2.8 mmol/L — ABNORMAL LOW (ref 3.5–5.1)
Sodium: 139 mmol/L (ref 135–145)

## 2021-08-17 SURGERY — OPEN REDUCTION INTERNAL FIXATION (ORIF) DISTAL RADIUS FRACTURE
Anesthesia: Monitor Anesthesia Care | Site: Wrist | Laterality: Left

## 2021-08-17 MED ORDER — CEFAZOLIN SODIUM-DEXTROSE 2-4 GM/100ML-% IV SOLN
2.0000 g | INTRAVENOUS | Status: AC
  Administered 2021-08-17: 2 g via INTRAVENOUS

## 2021-08-17 MED ORDER — LIDOCAINE 2% (20 MG/ML) 5 ML SYRINGE
INTRAMUSCULAR | Status: AC
  Filled 2021-08-17: qty 5

## 2021-08-17 MED ORDER — EPHEDRINE SULFATE (PRESSORS) 50 MG/ML IJ SOLN
INTRAMUSCULAR | Status: DC | PRN
  Administered 2021-08-17 (×3): 10 mg via INTRAVENOUS

## 2021-08-17 MED ORDER — PROPOFOL 500 MG/50ML IV EMUL
INTRAVENOUS | Status: DC | PRN
  Administered 2021-08-17: 75 ug/kg/min via INTRAVENOUS

## 2021-08-17 MED ORDER — CELECOXIB 200 MG PO CAPS
ORAL_CAPSULE | ORAL | Status: AC
  Filled 2021-08-17: qty 1

## 2021-08-17 MED ORDER — LACTATED RINGERS IV SOLN: INTRAVENOUS | Status: DC

## 2021-08-17 MED ORDER — MIDAZOLAM HCL 2 MG/2ML IJ SOLN
INTRAMUSCULAR | Status: AC
  Filled 2021-08-17: qty 2

## 2021-08-17 MED ORDER — MIDAZOLAM HCL 5 MG/5ML IJ SOLN
INTRAMUSCULAR | Status: DC | PRN
  Administered 2021-08-17 (×2): 1 mg via INTRAVENOUS

## 2021-08-17 MED ORDER — ONDANSETRON HCL 4 MG/2ML IJ SOLN
INTRAMUSCULAR | Status: AC
  Filled 2021-08-17: qty 2

## 2021-08-17 MED ORDER — MIDAZOLAM HCL 2 MG/2ML IJ SOLN
2.0000 mg | Freq: Once | INTRAMUSCULAR | Status: AC
  Administered 2021-08-17: 1 mg via INTRAVENOUS

## 2021-08-17 MED ORDER — FENTANYL CITRATE (PF) 100 MCG/2ML IJ SOLN: 25.0000 ug | INTRAMUSCULAR | Status: DC | PRN

## 2021-08-17 MED ORDER — DEXMEDETOMIDINE HCL IN NACL 80 MCG/20ML IV SOLN
INTRAVENOUS | Status: AC
  Filled 2021-08-17: qty 20

## 2021-08-17 MED ORDER — ACETAMINOPHEN 500 MG PO TABS
1000.0000 mg | ORAL_TABLET | Freq: Once | ORAL | Status: AC
Start: 2021-08-17 — End: 2021-08-17
  Administered 2021-08-17: 1000 mg via ORAL

## 2021-08-17 MED ORDER — PROPOFOL 10 MG/ML IV BOLUS
INTRAVENOUS | Status: AC
  Filled 2021-08-17: qty 20

## 2021-08-17 MED ORDER — CEFAZOLIN SODIUM-DEXTROSE 2-4 GM/100ML-% IV SOLN
INTRAVENOUS | Status: AC
  Filled 2021-08-17: qty 100

## 2021-08-17 MED ORDER — DEXAMETHASONE SODIUM PHOSPHATE 4 MG/ML IJ SOLN
INTRAMUSCULAR | Status: DC | PRN
  Administered 2021-08-17: 4 mg via PERINEURAL

## 2021-08-17 MED ORDER — ONDANSETRON HCL 4 MG/2ML IJ SOLN
INTRAMUSCULAR | Status: DC | PRN
  Administered 2021-08-17: 4 mg via INTRAVENOUS

## 2021-08-17 MED ORDER — OXYCODONE HCL 5 MG PO TABS: 5.0000 mg | ORAL_TABLET | ORAL | 0 refills | Status: AC | PRN

## 2021-08-17 MED ORDER — FENTANYL CITRATE (PF) 100 MCG/2ML IJ SOLN: 100.0000 ug | Freq: Once | INTRAMUSCULAR | Status: DC

## 2021-08-17 MED ORDER — ACETAMINOPHEN 500 MG PO TABS
ORAL_TABLET | ORAL | Status: AC
  Filled 2021-08-17: qty 2

## 2021-08-17 MED ORDER — PROMETHAZINE HCL 25 MG/ML IJ SOLN: 6.2500 mg | INTRAMUSCULAR | Status: DC | PRN

## 2021-08-17 MED ORDER — OXYCODONE HCL 5 MG PO TABS: 5.0000 mg | ORAL_TABLET | Freq: Once | ORAL | Status: DC | PRN

## 2021-08-17 MED ORDER — ROPIVACAINE HCL 5 MG/ML IJ SOLN
INTRAMUSCULAR | Status: DC | PRN
  Administered 2021-08-17: 30 mL via PERINEURAL

## 2021-08-17 MED ORDER — MIDAZOLAM HCL 2 MG/2ML IJ SOLN: 2.0000 mg | Freq: Once | INTRAMUSCULAR | Status: DC

## 2021-08-17 MED ORDER — FENTANYL CITRATE (PF) 100 MCG/2ML IJ SOLN
INTRAMUSCULAR | Status: DC | PRN
  Administered 2021-08-17: 25 ug via INTRAVENOUS

## 2021-08-17 MED ORDER — FENTANYL CITRATE (PF) 100 MCG/2ML IJ SOLN
INTRAMUSCULAR | Status: AC
  Filled 2021-08-17: qty 2

## 2021-08-17 MED ORDER — CLONIDINE HCL (ANALGESIA) 100 MCG/ML EP SOLN
EPIDURAL | Status: DC | PRN
  Administered 2021-08-17: 80 ug

## 2021-08-17 MED ORDER — FENTANYL CITRATE (PF) 100 MCG/2ML IJ SOLN
100.0000 ug | Freq: Once | INTRAMUSCULAR | Status: AC
  Administered 2021-08-17: 100 ug via INTRAVENOUS

## 2021-08-17 MED ORDER — OXYCODONE HCL 5 MG/5ML PO SOLN: 5.0000 mg | Freq: Once | ORAL | Status: DC | PRN

## 2021-08-17 MED ORDER — CELECOXIB 200 MG PO CAPS
200.0000 mg | ORAL_CAPSULE | Freq: Once | ORAL | Status: AC
  Administered 2021-08-17: 200 mg via ORAL

## 2021-08-17 SURGICAL SUPPLY — 75 items
APL PRP STRL LF DISP 70% ISPRP (MISCELLANEOUS) ×1
BIT DRILL DIS RAD GEMNS 2.3X40 (DRILL) IMPLANT
BIT DRILL SOLID 2.0X40MM (BIT) IMPLANT
BIT DRILL SOLID 2.5X40MM (BIT) IMPLANT
BLADE SURG 15 STRL LF DISP TIS (BLADE) ×2 IMPLANT
BLADE SURG 15 STRL SS (BLADE) ×2
BNDG CMPR 9X4 STRL LF SNTH (GAUZE/BANDAGES/DRESSINGS) ×1
BNDG ELASTIC 3X5.8 VLCR STR LF (GAUZE/BANDAGES/DRESSINGS) ×3 IMPLANT
BNDG ESMARK 4X9 LF (GAUZE/BANDAGES/DRESSINGS) ×3 IMPLANT
BNDG GAUZE DERMACEA FLUFF (GAUZE/BANDAGES/DRESSINGS) ×1
BNDG GAUZE DERMACEA FLUFF 4 (GAUZE/BANDAGES/DRESSINGS) ×2 IMPLANT
BNDG GZE DERMACEA 4 6PLY (GAUZE/BANDAGES/DRESSINGS) ×1
BNDG PLASTER X FAST 3X3 WHT LF (CAST SUPPLIES) ×30 IMPLANT
BNDG PLSTR 9X3 FST ST WHT (CAST SUPPLIES) ×10
CHLORAPREP W/TINT 26 (MISCELLANEOUS) ×3 IMPLANT
CORD BIPOLAR FORCEPS 12FT (ELECTRODE) ×3 IMPLANT
COVER BACK TABLE 60X90IN (DRAPES) ×3 IMPLANT
COVER MAYO STAND STRL (DRAPES) ×3 IMPLANT
COVER SURGICAL LIGHT HANDLE (MISCELLANEOUS) ×1 IMPLANT
CUFF TOURN SGL QUICK 18X4 (TOURNIQUET CUFF) ×3 IMPLANT
CUFF TOURN SGL QUICK 24 (TOURNIQUET CUFF)
CUFF TRNQT CYL 24X4X16.5-23 (TOURNIQUET CUFF) IMPLANT
DRAPE EXTREMITY T 121X128X90 (DISPOSABLE) ×3 IMPLANT
DRAPE OEC MINIVIEW 54X84 (DRAPES) ×3 IMPLANT
DRAPE SURG 17X23 STRL (DRAPES) ×3 IMPLANT
DRILL DIS RAD GEMINUS 2.3X40 (DRILL) ×2
DRILL SOLID 2.0X40MM (BIT) ×2
DRILL SOLID 2.5X40MM (BIT) ×2
GAUZE 4X4 16PLY ~~LOC~~+RFID DBL (SPONGE) ×1 IMPLANT
GAUZE SPONGE 4X4 12PLY STRL (GAUZE/BANDAGES/DRESSINGS) ×3 IMPLANT
GAUZE XEROFORM 1X8 LF (GAUZE/BANDAGES/DRESSINGS) IMPLANT
GLOVE BIO SURGEON STRL SZ7 (GLOVE) ×3 IMPLANT
GLOVE BIOGEL PI IND STRL 7.0 (GLOVE) IMPLANT
GLOVE BIOGEL PI IND STRL 8 (GLOVE) IMPLANT
GLOVE BIOGEL PI INDICATOR 7.0 (GLOVE) ×1
GLOVE BIOGEL PI INDICATOR 8 (GLOVE) ×1
GLOVE SURG SS PI 6.5 STRL IVOR (GLOVE) ×1 IMPLANT
GOWN STRL REUS W/ TWL LRG LVL3 (GOWN DISPOSABLE) ×2 IMPLANT
GOWN STRL REUS W/ TWL XL LVL3 (GOWN DISPOSABLE) ×2 IMPLANT
GOWN STRL REUS W/TWL LRG LVL3 (GOWN DISPOSABLE) ×2
GOWN STRL REUS W/TWL XL LVL3 (GOWN DISPOSABLE) ×2
GUIDE AIMING 1.5MM (WIRE) ×2 IMPLANT
NDL HYPO 25X1 1.5 SAFETY (NEEDLE) IMPLANT
NEEDLE HYPO 25X1 1.5 SAFETY (NEEDLE) IMPLANT
NS IRRIG 1000ML POUR BTL (IV SOLUTION) ×3 IMPLANT
PACK BASIN DAY SURGERY FS (CUSTOM PROCEDURE TRAY) ×3 IMPLANT
PAD CAST 3X4 CTTN HI CHSV (CAST SUPPLIES) ×2 IMPLANT
PADDING CAST COTTON 3X4 STRL (CAST SUPPLIES) ×2
PEG GEMINUS SMOOTH LOCK 2.0X19 (Peg) ×3 IMPLANT
PEG SMOOTH LOCK 2.0X18 (Screw) ×2 IMPLANT
PEG SMOOTH LOCKING 20MM (Peg) ×1 IMPLANT
PLATE DORSAL SHT SPAN GEM 210 (Plate) ×1 IMPLANT
PLATE GEMINUS HOOK (Plate) ×1 IMPLANT
PLATE LEFT NARROW 3H (Plate) ×1 IMPLANT
SCREW COMP MTHRD VOLAR 3X8 (Screw) ×1 IMPLANT
SCREW COMP MULTI THRD 3.0X10 (Screw) ×3 IMPLANT
SCREW GEMINUS CORT LOCK 3.5X11 (Screw) ×2 IMPLANT
SCREW GEMINUS HOOK PLATE (Screw) ×1 IMPLANT
SCREW LOCK MULTI THRD 3.0X12 (Screw) ×1 IMPLANT
SCREW LOCK MULTI THRD 3X8 (Screw) ×1 IMPLANT
SCREW NONLOCK POLYAXIAL 3.5X11 (Screw) ×2 IMPLANT
SCREWDRIVER SURG ST 2 (INSTRUMENTS) ×2 IMPLANT
SLEEVE SCD COMPRESS KNEE MED (STOCKING) IMPLANT
SPLINT FIBERGLASS 4X30 (CAST SUPPLIES) ×1 IMPLANT
SPONGE T-LAP 4X18 ~~LOC~~+RFID (SPONGE) ×3 IMPLANT
SUT ETHILON 2 0 FS 18 (SUTURE) ×2 IMPLANT
SUT ETHILON 4 0 PS 2 18 (SUTURE) ×6 IMPLANT
SUT MNCRL AB 3-0 PS2 18 (SUTURE) ×4 IMPLANT
SUT VICRYL 4-0 PS2 18IN ABS (SUTURE) IMPLANT
SYR BULB EAR ULCER 3OZ GRN STR (SYRINGE) ×3 IMPLANT
SYR CONTROL 10ML LL (SYRINGE) IMPLANT
TOWEL GREEN STERILE FF (TOWEL DISPOSABLE) ×6 IMPLANT
UNDERPAD 30X36 HEAVY ABSORB (UNDERPADS AND DIAPERS) ×3 IMPLANT
WIRE FIX 1.5 STANDARD TIP (WIRE) ×4
WIRE FIX 1.5 STD TIP (WIRE) IMPLANT

## 2021-08-17 NOTE — Brief Op Note (Signed)
08/17/2021  5:25 PM  PATIENT:  April Stevens  66 y.o. female  PRE-OPERATIVE DIAGNOSIS:  left distal radius fracture  POST-OPERATIVE DIAGNOSIS:  left distal radius fracture  PROCEDURE:  Procedure(s): OPEN REDUCTION INTERNAL FIXATION (ORIF) LEFT DISTAL RADIAL FRACTURE (Left)  SURGEON:  Surgeon(s) and Role:    * Sherilyn Cooter, MD - Primary  PHYSICIAN ASSISTANT:   ASSISTANTS: none   ANESTHESIA:   regional and MAC  EBL:  20   BLOOD ADMINISTERED:none  DRAINS: none   LOCAL MEDICATIONS USED:  NONE  SPECIMEN:  No Specimen  DISPOSITION OF SPECIMEN:  N/A  COUNTS:  YES  TOURNIQUET:   Total Tourniquet Time Documented: Upper Arm (Left) - 156 minutes Total: Upper Arm (Left) - 156 minutes   DICTATION: .Viviann Spare Dictation  PLAN OF CARE: Discharge to home after PACU  PATIENT DISPOSITION:  PACU - hemodynamically stable.   Delay start of Pharmacological VTE agent (>24hrs) due to surgical blood loss or risk of bleeding: not applicable

## 2021-08-17 NOTE — Progress Notes (Signed)
Assisted Dr. Germeroth with left, axillary, ultrasound guided block. Side rails up, monitors on throughout procedure. See vital signs in flow sheet. Tolerated Procedure well. 

## 2021-08-17 NOTE — Transfer of Care (Signed)
Immediate Anesthesia Transfer of Care Note  Patient: SURIAH PERAGINE  Procedure(s) Performed: OPEN REDUCTION INTERNAL FIXATION (ORIF) LEFT DISTAL RADIAL FRACTURE (Left: Wrist)  Patient Location: PACU  Anesthesia Type:MAC combined with regional for post-op pain  Level of Consciousness: awake, alert , oriented and patient cooperative  Airway & Oxygen Therapy: Patient Spontanous Breathing and Patient connected to face mask oxygen  Post-op Assessment: Report given to RN and Post -op Vital signs reviewed and stable  Post vital signs: Reviewed and stable  Last Vitals:  Vitals Value Taken Time  BP    Temp    Pulse 87 08/17/21 1716  Resp    SpO2 92 % 08/17/21 1716  Vitals shown include unvalidated device data.  Last Pain:  Vitals:   08/17/21 1123  TempSrc: Oral  PainSc: 0-No pain         Complications: No notable events documented.

## 2021-08-17 NOTE — Anesthesia Procedure Notes (Signed)
Procedure Name: MAC Date/Time: 08/17/2021 1:40 PM  Performed by: Bufford Spikes, CRNAPre-anesthesia Checklist: Patient identified, Emergency Drugs available, Suction available, Patient being monitored and Timeout performed Patient Re-evaluated:Patient Re-evaluated prior to induction Oxygen Delivery Method: Simple face mask

## 2021-08-17 NOTE — Discharge Instructions (Addendum)
April Stevens, M.D. Hand Surgery  POST-OPERATIVE DISCHARGE INSTRUCTIONS   PRESCRIPTIONS: You may have been given a prescription to be taken as directed for post-operative pain control.  You may also take over the counter ibuprofen/aleve and tylenol for pain. Take this as directed on the packaging. Do not exceed 3000 mg tylenol/acetaminophen in 24 hours.  Ibuprofen 600-800 mg (3-4) tablets by mouth every 6 hours as needed for pain.  OR Aleve 2 tablets by mouth every 12 hours (twice daily) as needed for pain.  AND/OR Tylenol 1000 mg (2 tablets) every 8 hours as needed for pain.  Please use your pain medication carefully, as refills are limited and you may not be provided with one.  As stated above, please use over the counter pain medicine - it will also be helpful with decreasing your swelling.    ANESTHESIA: After your surgery, post-surgical discomfort or pain is likely. This discomfort can last several days to a few weeks. At certain times of the day your discomfort may be more intense.   Did you receive a nerve block?  A nerve block can provide pain relief for one hour to two days after your surgery. As long as the nerve block is working, you will experience little or no sensation in the area the surgeon operated on.  As the nerve block wears off, you will begin to experience pain or discomfort. It is very important that you begin taking your prescribed pain medication before the nerve block fully wears off. Treating your pain at the first sign of the block wearing off will ensure your pain is better controlled and more tolerable when full-sensation returns. Do not wait until the pain is intolerable, as the medicine will be less effective. It is better to treat pain in advance than to try and catch up.   General Anesthesia:  If you did not receive a nerve block during your surgery, you will need to start taking your pain medication shortly after your surgery and should continue  to do so as prescribed by your surgeon.     ICE AND ELEVATION: You may use ice for the first 48-72 hours, but it is not critical.   Motion of your fingers is very important to decrease the swelling.  Elevation, as much as possible for the next 48 hours, is critical for decreasing swelling as well as for pain relief. Elevation means when you are seated or lying down, you hand should be at or above your heart. When walking, the hand needs to be at or above the level of your elbow.  If the bandage gets too tight, it may need to be loosened. Please contact our office and we will instruct you in how to do this.    SURGICAL BANDAGES:  Keep your dressing and/or splint clean and dry at all times.  Do not remove until you are seen again in the office.  If careful, you may place a plastic bag over your bandage and tape the end to shower, but be careful, do not get your bandages wet.     HAND THERAPY:  You may not need any. If you do, we will begin this at your follow up visit in the clinic.    ACTIVITY AND WORK: You are encouraged to move any fingers which are not in the bandage.  Light use of the fingers is allowed to assist the other hand with daily hygiene and eating, but strong gripping or lifting is often uncomfortable and  should be avoided.  You might miss a variable period of time from work and hopefully this issue has been discussed prior to surgery. You may not do any heavy work with your affected hand for about 2 weeks.    [facility] [address] [place],  [place]  [address] [phone]    No Tylenol before 5:30pm if needed. No ibuprofen before 7:30pm if needed.  Post Anesthesia Home Care Instructions  Activity: Get plenty of rest for the remainder of the day. A responsible individual must stay with you for 24 hours following the procedure.  For the next 24 hours, DO NOT: -Drive a car -Paediatric nurse -Drink alcoholic beverages -Take any medication  unless instructed by your physician -Make any legal decisions or sign important papers.  Meals: Start with liquid foods such as gelatin or soup. Progress to regular foods as tolerated. Avoid greasy, spicy, heavy foods. If nausea and/or vomiting occur, drink only clear liquids until the nausea and/or vomiting subsides. Call your physician if vomiting continues.  Special Instructions/Symptoms: Your throat may feel dry or sore from the anesthesia or the breathing tube placed in your throat during surgery. If this causes discomfort, gargle with warm salt water. The discomfort should disappear within 24 hours.  If you had a scopolamine patch placed behind your ear for the management of post- operative nausea and/or vomiting:  1. The medication in the patch is effective for 72 hours, after which it should be removed.  Wrap patch in a tissue and discard in the trash. Wash hands thoroughly with soap and water. 2. You may remove the patch earlier than 72 hours if you experience unpleasant side effects which may include dry mouth, dizziness or visual disturbances. 3. Avoid touching the patch. Wash your hands with soap and water after contact with the patch.      Regional Anesthesia Blocks  1. Numbness or the inability to move the "blocked" extremity may last from 3-48 hours after placement. The length of time depends on the medication injected and your individual response to the medication. If the numbness is not going away after 48 hours, call your surgeon.  2. The extremity that is blocked will need to be protected until the numbness is gone and the  Strength has returned. Because you cannot feel it, you will need to take extra care to avoid injury. Because it may be weak, you may have difficulty moving it or using it. You may not know what position it is in without looking at it while the block is in effect.  3. For blocks in the legs and feet, returning to weight bearing and walking needs to be done  carefully. You will need to wait until the numbness is entirely gone and the strength has returned. You should be able to move your leg and foot normally before you try and bear weight or walk. You will need someone to be with you when you first try to ensure you do not fall and possibly risk injury.  4. Bruising and tenderness at the needle site are common side effects and will resolve in a few days.  5. Persistent numbness or new problems with movement should be communicated to the surgeon or the [facility] [phone] [place] [phone]).

## 2021-08-17 NOTE — Interval H&P Note (Signed)
History and Physical Interval Note:  08/17/2021 12:30 PM  April Stevens  has presented today for surgery, with the diagnosis of left distal radius fracture.  The various methods of treatment have been discussed with the patient and family. After consideration of risks, benefits and other options for treatment, the patient has consented to  Procedure(s): OPEN REDUCTION INTERNAL FIXATION (ORIF) LEFT DISTAL RADIAL FRACTURE (Left) as a surgical intervention.  The patient's history has been reviewed, patient examined, no change in status, stable for surgery.  I have reviewed the patient's chart and labs.  Questions were answered to the patient's satisfaction.     Temeka Pore Thamara Leger

## 2021-08-17 NOTE — Anesthesia Preprocedure Evaluation (Addendum)
Anesthesia Evaluation  Patient identified by MRN, date of birth, ID band Patient awake    Reviewed: Allergy & Precautions, NPO status , Patient's Chart, lab work & pertinent test results  Airway Mallampati: III  TM Distance: >3 FB Neck ROM: Full    Dental  (+) Dental Advisory Given, Teeth Intact   Pulmonary sleep apnea , former smoker,    Pulmonary exam normal breath sounds clear to auscultation       Cardiovascular hypertension, Pt. on medications Normal cardiovascular exam Rhythm:Regular Rate:Normal     Neuro/Psych PSYCHIATRIC DISORDERS Anxiety Depression negative neurological ROS     GI/Hepatic negative GI ROS, Neg liver ROS,   Endo/Other  negative endocrine ROS  Renal/GU negative Renal ROS     Musculoskeletal negative musculoskeletal ROS (+)   Abdominal (+) + obese,   Peds  Hematology negative hematology ROS (+)   Anesthesia Other Findings Appendicitis  Reproductive/Obstetrics negative OB ROS                            Anesthesia Physical  Anesthesia Plan  ASA: 3  Anesthesia Plan: MAC   Post-op Pain Management: Tylenol PO (pre-op)*, Celebrex PO (pre-op)* and Regional block*   Induction: Intravenous  PONV Risk Score and Plan: 2 and Ondansetron, Dexamethasone, Midazolam, Treatment may vary due to age or medical condition, TIVA and Propofol infusion  Airway Management Planned: Natural Airway  Additional Equipment:   Intra-op Plan:   Post-operative Plan:   Informed Consent: I have reviewed the patients History and Physical, chart, labs and discussed the procedure including the risks, benefits and alternatives for the proposed anesthesia with the patient or authorized representative who has indicated his/her understanding and acceptance.     Dental advisory given  Plan Discussed with: CRNA  Anesthesia Plan Comments:        Anesthesia Quick Evaluation

## 2021-08-17 NOTE — Anesthesia Procedure Notes (Signed)
Anesthesia Regional Block: Axillary brachial plexus block   Pre-Anesthetic Checklist: , timeout performed,  Correct Patient, Correct Site, Correct Laterality,  Correct Procedure, Correct Position, site marked,  Risks and benefits discussed,  Surgical consent,  Pre-op evaluation,  At surgeon's request and post-op pain management  Laterality: Upper and Left  Prep: chloraprep       Needles:  Injection technique: Single-shot  Needle Type: Stimiplex          Additional Needles:   Procedures:,,,, ultrasound used (permanent image in chart),,    Narrative:  Start time: 08/17/2021 12:22 PM End time: 08/17/2021 12:42 PM Injection made incrementally with aspirations every 5 mL.  Performed by: Personally  Anesthesiologist: Nolon Nations, MD  Additional Notes: BP cuff, SpO2 and EKG monitors applied. Sedation begun. Nerve location verified with ultrasound. Anesthetic injected incrementally, slowly, and after neg aspirations under direct u/s guidance. Good perineural spread. Tolerated well.

## 2021-08-18 ENCOUNTER — Encounter (HOSPITAL_BASED_OUTPATIENT_CLINIC_OR_DEPARTMENT_OTHER): Payer: Self-pay | Admitting: Orthopedic Surgery

## 2021-08-18 NOTE — Anesthesia Postprocedure Evaluation (Signed)
Anesthesia Post Note  Patient: April Stevens  Procedure(s) Performed: OPEN REDUCTION INTERNAL FIXATION (ORIF) LEFT DISTAL RADIAL FRACTURE (Left: Wrist)     Patient location during evaluation: PACU Anesthesia Type: MAC and Regional Level of consciousness: awake and alert Pain management: pain level controlled Vital Signs Assessment: post-procedure vital signs reviewed and stable Respiratory status: spontaneous breathing, nonlabored ventilation, respiratory function stable and patient connected to nasal cannula oxygen Cardiovascular status: stable and blood pressure returned to baseline Postop Assessment: no apparent nausea or vomiting Anesthetic complications: no   No notable events documented.  Last Vitals:  Vitals:   08/17/21 1800 08/17/21 1827  BP: 103/72 124/73  Pulse: 91 95  Resp: 18 16  Temp:  36.8 C  SpO2: 93% 95%    Last Pain:  Vitals:   08/17/21 1827  TempSrc:   PainSc: 0-No pain                 Nathanyal Ashmead L Dayquan Buys

## 2021-08-20 NOTE — Op Note (Signed)
Date of Surgery: 08/20/2021  INDICATIONS: Patient is a 66 y.o.-year-old female with a severely comminuted, intra-articular left distal radius fracture after a ground level fall one week ago.  Risks, benefits, and alternatives to surgery were again discussed with the patient in the preoperative area. The patient wishes to proceed with surgery.  Informed consent was signed after our discussion.   PREOPERATIVE DIAGNOSIS:  Left distal radius fracture, closed, intra-articular, 3+ fragments  POSTOPERATIVE DIAGNOSIS: Same.  PROCEDURE:  Open reduction internal fixation of left distal radius fracture, 3+ fragments   SURGEON: Audria Nine, M.D.  ASSIST:   ANESTHESIA:  Regional, MAC  IV FLUIDS AND URINE: See anesthesia.  ESTIMATED BLOOD LOSS: 10 mL.  IMPLANTS:  Implant Name Type Inv. Item Serial No. Manufacturer Lot No. LRB No. Used Action  PLATE LEFT NARROW 3H - SOPENED ON STERILE FIELD Plate PLATE LEFT NARROW 3H OPENED ON STERILE FIELD SKELETAL [employer]YNAM[employer]  Left 1 Implanted  SCREW NONLOCK POLYAXIAL 3.5X11 - SOPEND ON STERIL FIELD Screw SCREW NONLOCK POLYAXIAL 3.5X11 OPEND ON STERIL FIELD SKELETAL DYNAMICS  Left 2 Implanted  PEG SMOOTH LOCK 2.0X18 - SOPEND ON STERILE FIELD Screw PEG SMOOTH LOCK 2.0X18 OPEND ON STERILE FIELD SKELETAL DYNAMICS  Left 2 Implanted  PEG GEMINUS SMOOTH LOCK 2.0X19 - SOPEND ON STERILE FIELD Peg PEG GEMINUS SMOOTH LOCK 2.0X19 OPEND ON STERILE FIELD SKELETAL DYNAMICS  Left 1 Implanted  PEG GEMINUS SMOOTH LOCK 2.0X19 - SOPENED ON STERILE TRAY Peg PEG GEMINUS SMOOTH LOCK 2.0X19 OPENED ON STERILE TRAY SKELETAL DYNAMICS  Left 2 Implanted  PEG SMOOTH LOCKING 20MM - SOPENED ON STERILE TRAY Peg PEG SMOOTH LOCKING 20MM OPENED ON STERILE TRAY SKELETAL DYNAMICS  Left 1 Implanted  SCREW GEMINUS CORT LOCK 3.5X11 - SOPENED ON STERILE TRAY Screw SCREW GEMINUS CORT LOCK 3.5X11 OPENED ON STERILE TRAY SKELETAL DYNAMICS  Left 2 Implanted  PLATE GEMINUS HOOK - SOPENED ON STERILE TRAY  Plate PLATE GEMINUS HOOK OPENED ON STERILE TRAY SKELETAL DYNAMICS  Left 1 Implanted  SCREW GEMINUS HOOK PLATE - SOPENED ON STERILE FIELD Screw SCREW GEMINUS HOOK PLATE OPENED ON STERILE FIELD SKELETAL DYNAMICS  Left 1 Implanted  SCREW LOCK MULTI THRD 3X8 - SOPENED ON STERILE FIELD Screw SCREW LOCK MULTI THRD 3X8 OPENED ON STERILE FIELD SKELETAL DYNAMICS  Left 1 Implanted  SCREW LOCK MULTI THRD 3.0X12 - SOPENED ON STERILE FIELD Screw SCREW LOCK MULTI THRD 3.0X12 OPENED ON STERILE FIELD SKELETAL DYNAMICS  Left 1 Implanted  SCREW COMP MTHRD VOLAR 3X8 - SOPENED ON STREILE TRAY Screw SCREW COMP MTHRD VOLAR 3X8 OPENED ON STREILE TRAY SKELETAL DYNAMICS  Left 1 Implanted  SCREW COMP MULTI THRD 3.0X10 - SOPENED ON STERILE TRAY Screw SCREW COMP MULTI THRD 3.0X10 OPENED ON STERILE TRAY SKELETAL DYNAMICS  Left 3 Implanted  PLATE DORSAL SHT SPAN GEM 210 - SOPENED ON STERILE TRAY Plate PLATE DORSAL SHT SPAN GEM 210 OPENED ON STERILE TRAY SKELETAL DYNAMICS  Left 1 Implanted     DRAINS: None  COMPLICATIONS: None  DESCRIPTION OF PROCEDURE: The patient was met in the preoperative holding area where the surgical site was marked and the consent form was verified.  The patient was then taken to the operating room and transferred to the operating table.  All bony prominences were well padded.  A tourniquet was applied to the left upper arm.  Monitored sedation was induced.  The operative extremity was prepped and draped in the usual and sterile fashion.  A formal time-out was performed to confirm that this  was the correct patient, surgery, side, and site.   Following timeout, the limb was gently exsanguinated with an Esmarch bandage and the tourniquet inflated to 250 mmHg.  An extended FCR approach was made to the volar aspect of the wrist.  The skin was incised.  The FCR tendon sheath was incised.  Blunt dissection was used distally to identify the superficial branch of the radial artery which was protected.  The FCR  tendon was retracted ulnarly.  The floor of the FCR tendon sheath was incised.  The FPL tendon and muscle belly was identified and blunt dissection was used to identify Parona space.  The pronator quadratus was incised at its distal and radial margins.  An elevator was used to subperiosteally dissect the pronator quadratus off of the volar aspect of the radius.  Blunt dissection was used at the radial septum to identify the brachioradialis tendon.  The first dorsal compartment tendons were identified.  The brachial radialis was cut to allow for better reduction of the radial column.  There was a severely comminuted fracture of the radial column.  There was a extra-articular fracture of the ulnar column of the distal radius which was quite distal.  The fracture site was debrided of callus and soft tissue.  Fluoroscopy was brought in and the volar ulnar fragment was found to be in extended position.  There was significant comminution of the dorsal half of the articular surface.  A freer elevator was used in the fracture site to try to elevate this dorsal portion of the articular surface.  A 3 hole narrow plate was chosen and placed on the volar aspect of the radius.  It was fixed in the oblong hole of the plate.  The fracture was reduced to the plate.  A K wire was placed in the ulnar and radial heads in the aiming guides.  I had some difficulty getting the wires to be in a good subchondral position given the extensive dorsal comminution and difficulty elevating the dorsal half of the articular surface.  I was eventually able to get the K wires in subchondral position and smooth locking pegs were placed in the ulnar head of the plate.  The volar ulnar fragment was quite distal and as such I decided to use a hook plate to achieve fixation of this fragment.  The hook plate was applied and fixed to the volar plate.  I then turned my attention towards the radial column of the radius.  The K wire was used in the radial  styloid to try to reduce the styloid fragment back to the remainder of the radius and achieve appropriate radial inclination.  With an appropriate reduction, smooth locking pegs were placed in the remaining holes of the radial head of the plate.  The previously placed K wire was removed and a radial styloid screw was placed.  The radial height and inclination was restored.  There was significant comminution of the radial column.  A 30 degree lateral view demonstrated that all screws were in subchondral position.  Given the distal nature of the fracture and the significant dorsal comminution, I was concerned about fracture stability which is the volar plate.  I therefore decided to apply a dorsal spanning plate for additional supplemental fixation.  Fluoroscopy was used to align the dorsal spanning plate with the shaft of the first metacarpal and the radial shaft.  The skin was incised.  Blunt dissection was used proximally to identify the interval between the APL and EPB  tendons and the radial wrist extensors.  The radius was identified.  I then used blunt dissection to identify the EIP and EDC tendons to the index finger.  These were retracted and the metacarpal shaft was identified.  Blunt dissection with an elevator was used to remove the soft tissue from the dorsal aspect of the metacarpal.  The plate was then advanced in a retrograde fashion from distal to proximal.  The plate was centered on the index metacarpal and was fixed with a screw in the oblong hole.  The remaining distal screws were filled with locking screws given her age and concern for osteoporosis.  I then fixed the plate proximally on the radial shaft with a combination of locking and nonlocking screws.  I made a small incision over the area of Lister's tubercle and found that the EPL tendon was not entrapped under the plate.  At this point, repeat x-rays demonstrated appropriate fracture alignment on the AP view with restoration of the radial  height and inclination.  The 30 degree lateral view demonstrated that all screws were in good subchondral position with a congruent articular surface.  The wound was then thoroughly irrigated both volarly and dorsally with copious sterile saline.  The tourniquet was let down.  Hemostasis was achieved in both dorsal and volar wounds with a combination of direct pressure of the surgical site with bipolar cautery.  The dorsal wounds were closed with a 4-0 nylon suture in horizontal mattress fashion.  The volar wound was closed using a 3-0 Monocryl in a buried erupted fashion followed by a 4-0 nylon suture in a horizontal mattress fashion.  The wounds were dressed with Xeroform, folded Kerlix, cast padding, and a well-padded volar splint was applied.  The fingers were warm, pink, well-perfused within the procedure.  The patient was then reversed of anesthesia and transferred to the postoperative bed.  All counts were correct at the end the procedure.  The patient was and taken to PACU in stable condition.   POSTOPERATIVE PLAN: Patient be discharged home with appropriate pain medication and discharge instructions.  I will see her back in the office in 10 to 14 days for her first postop visit.  Audria Nine, MD 9:59 AM

## 2021-08-24 ENCOUNTER — Ambulatory Visit (INDEPENDENT_AMBULATORY_CARE_PROVIDER_SITE_OTHER): Payer: Worker's Compensation | Admitting: Orthopedic Surgery

## 2021-08-24 DIAGNOSIS — S52572A Other intraarticular fracture of lower end of left radius, initial encounter for closed fracture: Secondary | ICD-10-CM

## 2021-08-24 NOTE — Progress Notes (Signed)
   Post-Op Visit Note   Patient: April Stevens           Date of Birth: 1955/09/11           MRN: [id number] Visit Date: 08/24/2021 PCP: [name], [name], MD   Assessment & Plan:  Chief Complaint:  Chief Complaint  Patient presents with   Left Wrist - Routine Post Op, Fracture   Visit Diagnoses:  1. Other closed intra-articular fracture of distal end of left radius, initial encounter     Plan: Patient is 7 days s/p ORIF of a severely comminuted left distal radius fracture with both volar and bridge plate fixation.  Her incisions are clean, dry, and well approximated.  She has full ROM of her fingers.  I will see her back in another week for suture removal.   Follow-Up Instructions: No follow-ups on file.   Orders:  No orders of the defined types were placed in this encounter.  No orders of the defined types were placed in this encounter.   Imaging: No results found.  PMFS History: Patient Active Problem List   Diagnosis Date Noted   Closed fracture of left distal radius 08/14/2021   Perforated diverticulum 02/08/2020   Pneumoperitoneum 02/08/2020   HLD (hyperlipidemia) 02/08/2020   Pure hypercholesterolemia 10/02/2019   Prediabetes 10/02/2019   Hypokalemia 03/29/2019   Acute appendicitis 09/26/2018   Obstructive sleep apnea treated with continuous positive airway pressure (CPAP) 08/01/2017   Inadequate sleep hygiene 02/22/2017   Excessive daytime sleepiness 02/22/2017   Sleep deprivation 02/22/2017   Snoring 02/22/2017   OSA (obstructive sleep apnea) 02/22/2017   HTN (hypertension) 02/23/2012   Anxiety and depression 02/23/2012   Past Medical History:  Diagnosis Date   Depression    Hypertension    [name]     Family History  Problem Relation Age of Onset   Hypertension Father    Heart attack Father    Sleep apnea Neg Hx     Past Surgical History:  Procedure Laterality Date   APPENDECTOMY  09/26/2018   HYSTERECTOMY ABDOMINAL WITH  SALPINGO-OOPHORECTOMY  2000   DUB   JOINT REPLACEMENT     LAPAROSCOPIC APPENDECTOMY N/A 09/26/2018   Procedure: APPENDECTOMY LAPAROSCOPIC;  Surgeon: Kinsinger, Arta Bruce, MD;  Location: [facility];  Service: General;  Laterality: N/A;   OPEN REDUCTION INTERNAL FIXATION (ORIF) DISTAL RADIAL FRACTURE Left 08/17/2021   Procedure: OPEN REDUCTION INTERNAL FIXATION (ORIF) LEFT DISTAL RADIAL FRACTURE;  Surgeon: Sherilyn Cooter, MD;  Location: [facility];  Service: Orthopedics;  Laterality: Left;   SHOULDER SURGERY Left    01-02-2018, has had R shoulder rotator cuff previously   Social History   Occupational History   Not on file  Tobacco Use   Smoking status: Former    Packs/day: 3.00    Years: 20.00    Total pack years: 60.00    Types: Cigarettes    Quit date: 2000    Years since quitting: 23.6   Smokeless tobacco: Never  Vaping Use   Vaping Use: Never used  Substance and Sexual Activity   Alcohol use: Yes    Comment: socially   Drug use: No   Sexual activity: Yes

## 2021-08-31 ENCOUNTER — Ambulatory Visit (INDEPENDENT_AMBULATORY_CARE_PROVIDER_SITE_OTHER): Payer: Worker's Compensation | Admitting: Orthopedic Surgery

## 2021-08-31 DIAGNOSIS — S52572A Other intraarticular fracture of lower end of left radius, initial encounter for closed fracture: Secondary | ICD-10-CM

## 2021-08-31 NOTE — Progress Notes (Signed)
   Post-Op Visit Note   Patient: April Stevens           Date of Birth: Feb 25, 1955           MRN: [id number] Visit Date: 08/31/2021 PCP: [name], [name], MD   Assessment & Plan:  Chief Complaint:  Chief Complaint  Patient presents with   Left Wrist - Routine Post Op   Visit Diagnoses:  1. Other closed intra-articular fracture of distal end of left radius, initial encounter     Plan: Patient is approximately 2 weeks s/p ORIF of her left distal radius fracture with a volar locking and dorsal spanning plate construct.  She is doing well postoperatively.  The incisions are clean, dry, and well approximated.  The nylon sutures were removed.  She has near full ROM of her fingers and forearm.  We will star her in therapy to work on finger and forearm ROM.   Follow-Up Instructions: No follow-ups on file.   Orders:  No orders of the defined types were placed in this encounter.  No orders of the defined types were placed in this encounter.   Imaging: No results found.  PMFS History: Patient Active Problem List   Diagnosis Date Noted   Closed fracture of left distal radius 08/14/2021   Perforated diverticulum 02/08/2020   Pneumoperitoneum 02/08/2020   HLD (hyperlipidemia) 02/08/2020   Pure hypercholesterolemia 10/02/2019   Prediabetes 10/02/2019   Hypokalemia 03/29/2019   Acute appendicitis 09/26/2018   Obstructive sleep apnea treated with continuous positive airway pressure (CPAP) 08/01/2017   Inadequate sleep hygiene 02/22/2017   Excessive daytime sleepiness 02/22/2017   Sleep deprivation 02/22/2017   Snoring 02/22/2017   OSA (obstructive sleep apnea) 02/22/2017   HTN (hypertension) 02/23/2012   Anxiety and depression 02/23/2012   Past Medical History:  Diagnosis Date   Depression    Hypertension    [name]     Family History  Problem Relation Age of Onset   Hypertension Father    Heart attack Father    Sleep apnea Neg Hx     Past Surgical History:   Procedure Laterality Date   APPENDECTOMY  09/26/2018   HYSTERECTOMY ABDOMINAL WITH SALPINGO-OOPHORECTOMY  2000   DUB   JOINT REPLACEMENT     LAPAROSCOPIC APPENDECTOMY N/A 09/26/2018   Procedure: APPENDECTOMY LAPAROSCOPIC;  Surgeon: Kinsinger, Arta Bruce, MD;  Location: [place];  Service: General;  Laterality: N/A;   OPEN REDUCTION INTERNAL FIXATION (ORIF) DISTAL RADIAL FRACTURE Left 08/17/2021   Procedure: OPEN REDUCTION INTERNAL FIXATION (ORIF) LEFT DISTAL RADIAL FRACTURE;  Surgeon: Sherilyn Cooter, MD;  Location: [place];  Service: Orthopedics;  Laterality: Left;   SHOULDER SURGERY Left    01-02-2018, has had R shoulder rotator cuff previously   Social History   Occupational History   Not on file  Tobacco Use   Smoking status: Former    Packs/day: 3.00    Years: 20.00    Total pack years: 60.00    Types: Cigarettes    Quit date: 2000    Years since quitting: 23.6   Smokeless tobacco: Never  Vaping Use   Vaping Use: Never used  Substance and Sexual Activity   Alcohol use: Yes    Comment: socially   Drug use: No   Sexual activity: Yes

## 2021-09-08 ENCOUNTER — Ambulatory Visit (INDEPENDENT_AMBULATORY_CARE_PROVIDER_SITE_OTHER): Payer: Worker's Compensation | Admitting: Rehabilitative and Restorative Service Providers"

## 2021-09-08 ENCOUNTER — Other Ambulatory Visit: Payer: Self-pay

## 2021-09-08 DIAGNOSIS — M6281 Muscle weakness (generalized): Secondary | ICD-10-CM | POA: Diagnosis not present

## 2021-09-08 DIAGNOSIS — M25642 Stiffness of left hand, not elsewhere classified: Secondary | ICD-10-CM

## 2021-09-08 DIAGNOSIS — R600 Localized edema: Secondary | ICD-10-CM

## 2021-09-08 DIAGNOSIS — M25632 Stiffness of left wrist, not elsewhere classified: Secondary | ICD-10-CM

## 2021-09-08 DIAGNOSIS — M25532 Pain in left wrist: Secondary | ICD-10-CM

## 2021-09-08 NOTE — Therapy (Signed)
OUTPATIENT OCCUPATIONAL THERAPY ORTHO EVALUATION  Patient Name: April Stevens MRN: [id number] DOB:1955-11-04, 66 y.o., female Today's Date: 09/08/2021  PCP: Dr. Benay Spice [name] REFERRING PROVIDER: Dr. Sherilyn Cooter    OT End of Session - 09/08/21 1517     Visit Number 1    Number of Visits 12    Date for OT Re-Evaluation 11/06/21    Authorization Type WC eval + 12 visits approved    OT Start Time 9798    OT Stop Time 1637    OT Time Calculation (min) 79 min    Equipment Utilized During Treatment orthotic materials    Activity Tolerance Patient tolerated treatment well;No increased pain;Patient limited by fatigue;Patient limited by pain    Behavior During Therapy [facility] for tasks assessed/performed             Past Medical History:  Diagnosis Date   Depression    Hypertension    [name]    Past Surgical History:  Procedure Laterality Date   APPENDECTOMY  09/26/2018   HYSTERECTOMY ABDOMINAL WITH SALPINGO-OOPHORECTOMY  2000   DUB   JOINT REPLACEMENT     LAPAROSCOPIC APPENDECTOMY N/A 09/26/2018   Procedure: APPENDECTOMY LAPAROSCOPIC;  Surgeon: Kinsinger, Arta Bruce, MD;  Location: [place];  Service: General;  Laterality: N/A;   OPEN REDUCTION INTERNAL FIXATION (ORIF) DISTAL RADIAL FRACTURE Left 08/17/2021   Procedure: OPEN REDUCTION INTERNAL FIXATION (ORIF) LEFT DISTAL RADIAL FRACTURE;  Surgeon: Sherilyn Cooter, MD;  Location: [place];  Service: Orthopedics;  Laterality: Left;   SHOULDER SURGERY Left    01-02-2018, has had R shoulder rotator cuff previously   Patient Active Problem List   Diagnosis Date Noted   Closed fracture of left distal radius 08/14/2021   Perforated diverticulum 02/08/2020   Pneumoperitoneum 02/08/2020   HLD (hyperlipidemia) 02/08/2020   Pure hypercholesterolemia 10/02/2019   Prediabetes 10/02/2019   Hypokalemia 03/29/2019   Acute appendicitis 09/26/2018   Obstructive sleep apnea treated with continuous positive airway  pressure (CPAP) 08/01/2017   Inadequate sleep hygiene 02/22/2017   Excessive daytime sleepiness 02/22/2017   Sleep deprivation 02/22/2017   Snoring 02/22/2017   OSA (obstructive sleep apnea) 02/22/2017   HTN (hypertension) 02/23/2012   Anxiety and depression 02/23/2012    ONSET DATE: DOS 08/17/21  REFERRING DIAG: X21.194R (ICD-10-CM) - Other closed intra-articular fracture of distal end of left radius, initial encounter  THERAPY DIAG:  Localized edema  Muscle weakness (generalized)  Pain in left wrist  Stiffness of left hand, not elsewhere classified  Stiffness of left wrist, not elsewhere classified  Rationale for Evaluation and Treatment Rehabilitation  SUBJECTIVE:   SUBJECTIVE STATEMENT: She is a cleaner at [employer] and she states falling on outstretched arm when moving boxes, breaking left wrist. She underwent Left ORIF to distal radius as well as dorsal bridge plating due to the severity and instability of the comminuted fracture.  She states having some pain, stiffness, swelling, inability to do work tasks now and problems with home/daily tasks now as well.     PERTINENT HISTORY: Per MD: "Left distal radius fracture s/p ORIF with volar [plate] and dorsal bridge plate."  PRECAUTIONS: 3 weeks post op now, avoid pressure on wrist, but A/AROM to shoulder ,elbow FA, hand fine; start light strength at 5-8 weeks post op (plate typically removed at 2-4 months post op)   WEIGHT BEARING RESTRICTIONS Yes NWB in left arm now  PAIN:  Are you having pain? Yes Rating: 5/10 at rest today in  dorsum of and and also numbness in entire left thumb   FALLS: Has patient fallen in last 6 months? Yes. Number of falls 1 (this fall)  LIVING ENVIRONMENT: Lives with: lives with their family (granddaughter)   PLOF: Independent  PATIENT GOALS Get as much use out of left hand as she can and get back to work.   OBJECTIVE:   HAND DOMINANCE: Right   ADLs: Overall ADLs: States decreased  ability to grab, hold household objects, pain and inability to open containers, perform all FMS tasks, etc.    FUNCTIONAL OUTCOME MEASURES: Eval: Quck DASH 41% impairment today   UPPER EXTREMITY ROM    Eval: She can touch all finger tips to palm, but not tuck DIP Js to touch distal palmar crease (1cm gap now). Thumb is a bit stiff and has a gap to base of SF as well.  MCP Js are a bit tight from swelling and perhaps plate placement & MF MCP measured 67*, which is representative of the rest at this point.   Active ROM Left eval  Shoulder flexion ~150  Shoulder abduction ~150  Shoulder adduction   Shoulder extension   Shoulder internal rotation   Shoulder external rotation   Elbow flexion 150  Elbow extension 0  Wrist flexion X  Wrist extension X  Wrist ulnar deviation X  Wrist radial deviation X  Wrist pronation 56  Wrist supination 71  (Blank rows = not tested)  Active ROM Left eval  Thumb MCP (0-60)   Thumb IP (0-80)   Thumb Radial abd/add (0-55)    Thumb Palmar abd/add (0-45)    Thumb Opposition to Small Finger 2cm gap to base of SF, but can oppose to pad of SF   Index MCP (0-90)    Index PIP (0-100)    Index DIP (0-70)    Long MCP (0-90) 0-67*   Long PIP (0-100)    Long DIP (0-70)    Ring MCP (0-90)    Ring PIP (0-100)    Ring DIP (0-70)    Little MCP (0-90)    Little PIP (0-100)    Little DIP (0-70)    (Blank rows = not tested)   UPPER EXTREMITY MMT:    Eval NT due to too soon out from fx & sx   MMT Left TBD  Elbow flexion   Elbow extension   Wrist flexion   Wrist extension   Wrist ulnar deviation   Wrist radial deviation   Wrist pronation   Wrist supination   (Blank rows = not tested)  HAND FUNCTION: Eval: Overtly weak and stiff finger motion now Grip strength, Left: TBD when safe   COORDINATION: Eval: Overt impairment in GMS as she cannot move her wrist and FMS are diminished as well due to stiff fingers   Box and Blocks Test: TBD Blocks  today (TBD is [facility])  SENSATION: Eval: She can feel LT in thumb and pressure, but states Lt thumb is also numb feeling volar and dorsally, also diminished around scar areas mildly   EDEMA:   Eval:  Mildly swollen in left hand and wrist today  COGNITION: Overall cognitive status: WFL for evaluation today   OBSERVATIONS:   Eval: Scars are healing well, but volar scar seems somewhat adhered compared to the rest. Fingers have decent motion to palm today, and she is encouraged to maintain this, though MCP Js are a little tight and she states feeling "pulling" and pain around plate area (dorsum of 2nd  MC) with strong MCP J flexion.    TODAY'S TREATMENT:  Eval:  Custom orthotic fabrication was indicated due to pt's healing radius comminuted fracture, recent surgery, and need for safe, functional positioning. OT fabricated custom forearm based clamshell wrist immobilization orthotic for pt today to resting/protect left arm. It fit well with no areas of pressure, pt states a comfortable fit. Pt was educated on the wearing schedule, to call or come in ASAP if it is causing any irritation or is not achieving desired function. It will be checked/adjusted in upcoming sessions, as needed. Pt states understanding.   She was also edu to avoid any lifting >2# for now, use caution with daily routines, avoid pain and weight bearing. She was also edu for the following HEP to promote motion and ability, she demo's back with only mild pain (advised to use ice after, if needed), and states understanding:  Exercises - Standing Shoulder Flexion Full Range  - 3-4 x daily - 1-2 sets - 10-15 reps - Bend and Straighten Elbow  - 3-4 x daily - 1-2 sets - 10-15 reps - Palm Up / Palm Down  - 3-4 x daily - 1-2 sets - 10-15 reps - Touch Thumb to [facility] Finger  - 3-4 x daily - 1-2 sets - 10 reps - Tendon Glides  - 3-4 x daily - 3-5 reps - 2-3 seconds hold  Patient Education - Scar Massage x2 mins, 3-4 x day    PATIENT  EDUCATION: Education details: See tx section above for details  Person educated: Patient Education method: Verbal Instruction, Teach back, Handouts  Education comprehension: States and demonstrates understanding, Additional Education required    HOME EXERCISE PROGRAM: Access Code: [email or web address] URL: [email or web address] Date: 09/08/2021 Prepared by: Benito Mccreedy  GOALS: Goals reviewed with patient? Yes   SHORT TERM GOALS: (STG required if POC>30 days)  Pt will obtain protective, custom orthotic. Target date: 09/08/21 Goal status: MET  2.  Pt will demo/state understanding of initial HEP to improve pain levels and prerequisite motion. Target date: 09/18/21 Goal status: INITIAL   LONG TERM GOALS:  Pt will improve functional ability by decreased impairment per Quick DASH assessment from 41% to 20% or better, for better quality of life. Target date: 11/06/21 Goal status: INITIAL  2.  Pt will improve grip strength in left hand to at least 40lbs for functional use at home and in IADLs. Target date: 11/06/21 Goal status: INITIAL  3.  Pt will improve A/ROM in FA pronation from 56* to at least 70*, to have functional motion for tasks like reach and grasp.  Target date: 11/06/21 Goal status: INITIAL  4.  Pt will improve strength in left forearm to at least 4/5 MMT without pain to have increased functional ability to carry out selfcare and higher-level homecare tasks with no difficulty. Target date: 11/06/21 Goal status: INITIAL  5.  Pt will decrease pain at rest from 5/10 to 1/10 or better to have better sleep and occupational participation in daily roles. Target date: 11/06/21 Goal status: INITIAL   ASSESSMENT:  CLINICAL IMPRESSION: Patient is a 66 y.o. female who was seen today for occupational therapy evaluation for fractured left wrist, subsequent ORIF, bridge plate, pain, swelling, decreased function. She will benefit from OP OT to increase ability.     PERFORMANCE DEFICITS in functional skills including ADLs, IADLs, coordination, dexterity, sensation, edema, ROM, strength, pain, fascial restrictions, flexibility, FMC, GMC, body mechanics, endurance, decreased knowledge of precautions, and UE functional use, cognitive skills including problem  solving and safety awareness, and psychosocial skills including coping strategies, environmental adaptation, and routines and behaviors.   IMPAIRMENTS are limiting patient from ADLs, IADLs, rest and sleep, work, play, leisure, and social participation.   COMORBIDITIES has co-morbidities such as depression, OSA, OA and past joint replacements, HTN, and more  that affects occupational performance. Patient will benefit from skilled OT to address above impairments and improve overall function.  MODIFICATION OR ASSISTANCE TO COMPLETE EVALUATION: No modification of tasks or assist necessary to complete an evaluation.  OT OCCUPATIONAL PROFILE AND HISTORY: Problem focused assessment: Including review of records relating to presenting problem.  CLINICAL DECISION MAKING: Moderate - several treatment options, min-mod task modification necessary  REHAB POTENTIAL: Good  EVALUATION COMPLEXITY: Low      PLAN: OT FREQUENCY: 2x/week (tapering as appropriate)   OT DURATION: 8 weeks (through 11/06/21)   PLANNED INTERVENTIONS: self care/ADL training, therapeutic exercise, therapeutic activity, neuromuscular re-education, manual therapy, scar mobilization, passive range of motion, splinting, electrical stimulation, ultrasound, fluidotherapy, compression bandaging, moist heat, cryotherapy, contrast bath, patient/family education, energy conservation, coping strategies training, and Re-evaluation  RECOMMENDED OTHER SERVICES: none now   CONSULTED AND AGREED WITH PLAN OF CARE: Patient  PLAN FOR NEXT SESSION: check orthotic and initial HEP, upgrade as tolerated and per healing    Benito Mccreedy, OTR/L,  CHT 09/08/2021, 5:10 PM

## 2021-09-11 ENCOUNTER — Encounter: Payer: Medicare HMO | Admitting: Rehabilitative and Restorative Service Providers"

## 2021-09-14 ENCOUNTER — Ambulatory Visit (INDEPENDENT_AMBULATORY_CARE_PROVIDER_SITE_OTHER): Payer: Worker's Compensation | Admitting: Orthopedic Surgery

## 2021-09-14 ENCOUNTER — Ambulatory Visit (INDEPENDENT_AMBULATORY_CARE_PROVIDER_SITE_OTHER): Payer: Medicare HMO | Admitting: Rehabilitative and Restorative Service Providers"

## 2021-09-14 ENCOUNTER — Ambulatory Visit: Payer: Self-pay

## 2021-09-14 ENCOUNTER — Encounter: Payer: Self-pay | Admitting: Rehabilitative and Restorative Service Providers"

## 2021-09-14 DIAGNOSIS — M25632 Stiffness of left wrist, not elsewhere classified: Secondary | ICD-10-CM

## 2021-09-14 DIAGNOSIS — R600 Localized edema: Secondary | ICD-10-CM | POA: Diagnosis not present

## 2021-09-14 DIAGNOSIS — M25642 Stiffness of left hand, not elsewhere classified: Secondary | ICD-10-CM | POA: Diagnosis not present

## 2021-09-14 DIAGNOSIS — M25532 Pain in left wrist: Secondary | ICD-10-CM | POA: Diagnosis not present

## 2021-09-14 DIAGNOSIS — M6281 Muscle weakness (generalized): Secondary | ICD-10-CM

## 2021-09-14 DIAGNOSIS — S52572A Other intraarticular fracture of lower end of left radius, initial encounter for closed fracture: Secondary | ICD-10-CM

## 2021-09-14 NOTE — Progress Notes (Signed)
   Post-Op Visit Note   Patient: April Stevens           Date of Birth: 08-14-1955           MRN: [id number] Visit Date: 09/14/2021 PCP: [name], [name], MD   Assessment & Plan:  Chief Complaint:  Chief Complaint  Patient presents with   Left Wrist - Routine Post Op   Visit Diagnoses:  1. Other closed intra-articular fracture of distal end of left radius, initial encounter     Plan: Patient is 4 weeks s/p ORIF left distal radius fracture with both volar and dorsal spanning plates.  She has full pronation and supination today.  She is able to make a full fist.  She still has mild paresthesias in the thumb but with full thumb abduction and opposition.  Normal sensation in remainder of fingers.  She will continue to work hard with therapy and I will see her back in a month.   Follow-Up Instructions: No follow-ups on file.   Orders:  Orders Placed This Encounter  Procedures   XR Wrist Complete Left   No orders of the defined types were placed in this encounter.   Imaging: No results found.  PMFS History: Patient Active Problem List   Diagnosis Date Noted   Closed fracture of left distal radius 08/14/2021   Perforated diverticulum 02/08/2020   Pneumoperitoneum 02/08/2020   HLD (hyperlipidemia) 02/08/2020   Pure hypercholesterolemia 10/02/2019   Prediabetes 10/02/2019   Hypokalemia 03/29/2019   Acute appendicitis 09/26/2018   Obstructive sleep apnea treated with continuous positive airway pressure (CPAP) 08/01/2017   Inadequate sleep hygiene 02/22/2017   Excessive daytime sleepiness 02/22/2017   Sleep deprivation 02/22/2017   Snoring 02/22/2017   OSA (obstructive sleep apnea) 02/22/2017   HTN (hypertension) 02/23/2012   Anxiety and depression 02/23/2012   Past Medical History:  Diagnosis Date   Depression    Hypertension    [name]     Family History  Problem Relation Age of Onset   Hypertension Father    Heart attack Father    Sleep apnea Neg Hx      Past Surgical History:  Procedure Laterality Date   APPENDECTOMY  09/26/2018   HYSTERECTOMY ABDOMINAL WITH SALPINGO-OOPHORECTOMY  2000   DUB   JOINT REPLACEMENT     LAPAROSCOPIC APPENDECTOMY N/A 09/26/2018   Procedure: APPENDECTOMY LAPAROSCOPIC;  Surgeon: Kinsinger, Arta Bruce, MD;  Location: [place];  Service: General;  Laterality: N/A;   OPEN REDUCTION INTERNAL FIXATION (ORIF) DISTAL RADIAL FRACTURE Left 08/17/2021   Procedure: OPEN REDUCTION INTERNAL FIXATION (ORIF) LEFT DISTAL RADIAL FRACTURE;  Surgeon: Sherilyn Cooter, MD;  Location: [place];  Service: Orthopedics;  Laterality: Left;   SHOULDER SURGERY Left    01-02-2018, has had R shoulder rotator cuff previously   Social History   Occupational History   Not on file  Tobacco Use   Smoking status: Former    Packs/day: 3.00    Years: 20.00    Total pack years: 60.00    Types: Cigarettes    Quit date: 2000    Years since quitting: 23.6   Smokeless tobacco: Never  Vaping Use   Vaping Use: Never used  Substance and Sexual Activity   Alcohol use: Yes    Comment: socially   Drug use: No   Sexual activity: Yes

## 2021-09-14 NOTE — Therapy (Signed)
OUTPATIENT OCCUPATIONAL THERAPY TREATMENT NOTE   Patient Name: April Stevens MRN: [id number] DOB:08-Apr-1955, 66 y.o., female Today's Date: 09/14/2021  PCP: Dr. Benay Spice Jacelyn Stevens REFERRING PROVIDER: Dr. Sherilyn Stevens   END OF SESSION:   OT End of Session - 09/14/21 0851     Visit Number 2    Number of Visits 12    Date for OT Re-Evaluation 11/06/21    Authorization Type WC eval + 12 visits approved    OT Start Time 0852    OT Stop Time 0931    OT Time Calculation (min) 39 min    Equipment Utilized During Treatment --    Activity Tolerance Patient tolerated treatment well;No increased pain;Patient limited by fatigue;Patient limited by pain    Behavior During Therapy [facility] for tasks assessed/performed             Past Medical History:  Diagnosis Date   Depression    Hypertension    [name]    Past Surgical History:  Procedure Laterality Date   APPENDECTOMY  09/26/2018   HYSTERECTOMY ABDOMINAL WITH SALPINGO-OOPHORECTOMY  2000   DUB   JOINT REPLACEMENT     LAPAROSCOPIC APPENDECTOMY N/A 09/26/2018   Procedure: APPENDECTOMY LAPAROSCOPIC;  Surgeon: April, Arta Bruce, MD;  Location: [place];  Service: General;  Laterality: N/A;   OPEN REDUCTION INTERNAL FIXATION (ORIF) DISTAL RADIAL FRACTURE Left 08/17/2021   Procedure: OPEN REDUCTION INTERNAL FIXATION (ORIF) LEFT DISTAL RADIAL FRACTURE;  Surgeon: April Cooter, MD;  Location: [place];  Service: Orthopedics;  Laterality: Left;   SHOULDER SURGERY Left    01-02-2018, has had R shoulder rotator cuff previously   Patient Active Problem List   Diagnosis Date Noted   Closed fracture of left distal radius 08/14/2021   Perforated diverticulum 02/08/2020   Pneumoperitoneum 02/08/2020   HLD (hyperlipidemia) 02/08/2020   Pure hypercholesterolemia 10/02/2019   Prediabetes 10/02/2019   Hypokalemia 03/29/2019   Acute appendicitis 09/26/2018   Obstructive sleep apnea treated with continuous positive airway  pressure (CPAP) 08/01/2017   Inadequate sleep hygiene 02/22/2017   Excessive daytime sleepiness 02/22/2017   Sleep deprivation 02/22/2017   Snoring 02/22/2017   OSA (obstructive sleep apnea) 02/22/2017   HTN (hypertension) 02/23/2012   Anxiety and depression 02/23/2012    ONSET DATE: DOS 08/17/21   REFERRING DIAG: S28.315V (ICD-10-CM) - Other closed intra-articular fracture of distal end of left radius, initial encounter  THERAPY DIAG:  Localized edema  Muscle weakness (generalized)  Pain in left wrist  Stiffness of left hand, not elsewhere classified  Stiffness of left wrist, not elsewhere classified  Rationale for Evaluation and Treatment Rehabilitation  PERTINENT HISTORY: Per MD: "Left distal radius fracture s/p ORIF with volar [plate] and dorsal bridge plate."   PRECAUTIONS: 4 weeks post op now, avoid pressure on wrist, but A/AROM to shoulder ,elbow FA, hand fine; start light strength at 5-8 weeks post op (plate typically removed at 2-4 months post op)    WEIGHT BEARING RESTRICTIONS Yes NWB in left arm now   SUBJECTIVE:  She states doing HEP and that her brace feels a bit tight.    PAIN:  Are you having pain? Yes  Rating: 2/10 at rest today in dorsum of left IF MCP J    OBJECTIVE: (All objective assessments below are from initial evaluation on: 09/08/21 unless otherwise specified.)   HAND DOMINANCE: Right    ADLs: Overall ADLs: States decreased ability to grab, hold household objects, pain and inability to open containers,  perform all FMS tasks, etc.      FUNCTIONAL OUTCOME MEASURES: Eval: Quck DASH 41% impairment today    UPPER EXTREMITY ROM    Eval: She can touch all finger tips to palm, but not tuck DIP Js to touch distal palmar crease (1cm gap now). Thumb is a bit stiff and has a gap to base of SF as well.  MCP Js are a bit tight from swelling and perhaps plate placement & MF MCP measured 67*, which is representative of the rest at this point.    Active  ROM Left eval Left  09/14/21  Shoulder flexion ~150   Shoulder abduction ~150   Shoulder adduction     Shoulder extension     Shoulder internal rotation     Shoulder external rotation     Elbow flexion 150   Elbow extension 0   Wrist flexion X   Wrist extension X   Wrist ulnar deviation X   Wrist radial deviation X   Wrist pronation 56 74  Wrist supination 71 75  (Blank rows = not tested)   Active ROM Left eval Left 09/14/21  Thumb MCP (0-60)   58  Thumb IP (0-80)   27  Thumb Radial abd/add (0-55)     Thumb Palmar abd/add (0-45)     Thumb Opposition to Small Finger 2cm gap to base of SF, but can oppose to pad of SF    Index MCP (0-90)     Index PIP (0-100)     Index DIP (0-70)     Long MCP (0-90) 0-67*  0-66*  Long PIP (0-100)     Long DIP (0-70)     Ring MCP (0-90)     Ring PIP (0-100)     Ring DIP (0-70)     Little MCP (0-90)     Little PIP (0-100)     Little DIP (0-70)     (Blank rows = not tested)     UPPER EXTREMITY MMT:    Eval NT due to too soon out from fx & sx    MMT Left TBD  Elbow flexion    Elbow extension    Wrist flexion    Wrist extension    Wrist ulnar deviation    Wrist radial deviation    Wrist pronation    Wrist supination    (Blank rows = not tested)   HAND FUNCTION: Eval: Overtly weak and stiff finger motion now Grip strength, Left: TBD when safe    COORDINATION: 09/14/21: Box and Blocks Test: 42 Blocks today (61 is WFL)  Eval: Overt impairment in GMS as she cannot move her wrist and FMS are diminished as well due to stiff fingers     SENSATION: Eval: She can feel LT in thumb and pressure, but states Lt thumb is also numb feeling volar and dorsally, also diminished around scar areas mildly    EDEMA:             Eval:  Mildly swollen in left hand and wrist today   COGNITION: Overall cognitive status: WFL for evaluation today    OBSERVATIONS:             Eval: Scars are healing well, but volar scar seems somewhat adhered  compared to the rest. Fingers have decent motion to palm today, and she is encouraged to maintain this, though MCP Js are a little tight and she states feeling "pulling" and pain around plate area (dorsum of 2nd MC)  with strong MCP J flexion.      TODAY'S TREATMENT:  09/14/21: OT adjusted orthotic slightly to make it easier to don/doff, AROM measures show great improvement in forearm already, OT also upgrades her HEP to new PROM at FA and fingers, does with her and has her perform back- mild pain/tension which she states is tolerable. Also did coordination activity (Box & Blocks) and reminded /encouraged to do as much light FNL activity as possible.   Exercises - Standing Shoulder Flexion Full Range  - 3-4 x daily - 1-2 sets - 10-15 reps - Bend and Straighten Elbow  - 3-4 x daily - 1-2 sets - 10-15 reps - Palm Up / Palm Down  - 3-4 x daily - 1-2 sets - 10-15 reps - Forearm Supination Stretch  - 3-4 x daily - 2-3 reps - 15 sec hold - Wrist Pronation Stretch  - 3-4 x daily - 2-3 reps - 15 sec hold - Touch Thumb to [facility] Finger  - 3-4 x daily - 1-2 sets - 10 reps - Tendon Glides  - 3-4 x daily - 3-5 reps - 2-3 seconds hold - BACK KNUCKLE STRETCHES   - 3-4 x daily - 2-3 reps - 15 hold - HOOK Stretch  - 3-4 x daily - 2-3 reps - 15-20 sec hold  Patient Education - Scar Massage   Eval:  Custom orthotic fabrication was indicated due to pt's healing radius comminuted fracture, recent surgery, and need for safe, functional positioning. OT fabricated custom forearm based clamshell wrist immobilization orthotic for pt today to resting/protect left arm. It fit well with no areas of pressure, pt states a comfortable fit. Pt was educated on the wearing schedule, to call or come in ASAP if it is causing any irritation or is not achieving desired function. It will be checked/adjusted in upcoming sessions, as needed. Pt states understanding.    She was also edu to avoid any lifting >2# for now, use caution with  daily routines, avoid pain and weight bearing. She was also edu for the following HEP to promote motion and ability, she demo's back with only mild pain (advised to use ice after, if needed), and states understanding:   Exercises - Standing Shoulder Flexion Full Range  - 3-4 x daily - 1-2 sets - 10-15 reps - Bend and Straighten Elbow  - 3-4 x daily - 1-2 sets - 10-15 reps - Palm Up / Palm Down  - 3-4 x daily - 1-2 sets - 10-15 reps - Touch Thumb to [facility] Finger  - 3-4 x daily - 1-2 sets - 10 reps - Tendon Glides  - 3-4 x daily - 3-5 reps - 2-3 seconds hold   Patient Education - Scar Massage x2 mins, 3-4 x day      PATIENT EDUCATION: Education details: See tx section above for details  Person educated: Patient Education method: Verbal Instruction, Teach back, Handouts  Education comprehension: States and demonstrates understanding, Additional Education required      HOME EXERCISE PROGRAM: Access Code: [email or web address] URL: [email or web address] Date: 09/08/2021 Prepared by: Benito Mccreedy   GOALS: Goals reviewed with patient? Yes     SHORT TERM GOALS: (STG required if POC>30 days)   Pt will obtain protective, custom orthotic. Target date: 09/08/21 Goal status: MET   2.  Pt will demo/state understanding of initial HEP to improve pain levels and prerequisite motion. Target date: 09/18/21 Goal status: 09/14/21: MET     LONG TERM GOALS:   Pt will improve  functional ability by decreased impairment per Quick DASH assessment from 41% to 20% or better, for better quality of life. Target date: 11/06/21 Goal status: INITIAL   2.  Pt will improve grip strength in left hand to at least 40lbs for functional use at home and in IADLs. Target date: 11/06/21 Goal status: INITIAL   3.  Pt will improve A/ROM in FA pronation from 56* to at least 70*, to have functional motion for tasks like reach and grasp.  Target date: 11/06/21 Goal status: INITIAL   4.  Pt will improve strength  in left forearm to at least 4/5 MMT without pain to have increased functional ability to carry out selfcare and higher-level homecare tasks with no difficulty. Target date: 11/06/21 Goal status: INITIAL   5.  Pt will decrease pain at rest from 5/10 to 1/10 or better to have better sleep and occupational participation in daily roles. Target date: 11/06/21 Goal status: INITIAL     ASSESSMENT:   CLINICAL IMPRESSION: 09/14/21: She has improved motion at forearm already, is working hard to prevent her MCP Js from tightening as dorsum of hand heals from plate insertion.  Continue on.   Eval: Patient is a 66 y.o. female who was seen today for occupational therapy evaluation for fractured left wrist, subsequent ORIF, bridge plate, pain, swelling, decreased function. She will benefit from OP OT to increase ability.        PLAN: OT FREQUENCY: 2x/week (tapering as appropriate)    OT DURATION: 8 weeks (through 11/06/21)    PLANNED INTERVENTIONS: self care/ADL training, therapeutic exercise, therapeutic activity, neuromuscular re-education, manual therapy, scar mobilization, passive range of motion, splinting, electrical stimulation, ultrasound, fluidotherapy, compression bandaging, moist heat, cryotherapy, contrast bath, patient/family education, energy conservation, coping strategies training, and Re-evaluation   RECOMMENDED OTHER SERVICES: none now    CONSULTED AND AGREED WITH PLAN OF CARE: Patient   PLAN FOR NEXT SESSION:  Check HEP and upgrade as appropriate    Benito Mccreedy, OTR/L, CHT 09/14/2021, 11:20 AM

## 2021-09-17 ENCOUNTER — Encounter: Payer: Self-pay | Admitting: Radiology

## 2021-09-17 ENCOUNTER — Telehealth: Payer: Self-pay | Admitting: Radiology

## 2021-09-17 NOTE — Telephone Encounter (Signed)
-----   Message from Deboraha Sprang sent at 09/16/2021 12:16 PM EDT ----- Regarding: SECURE  Restrictions? Alyse Low  See below email from [facility] regarding the restrictions and where Ms. Beel is out for the next 4 weeks.  Please advise on. Thx Tisha  ##WORK STATUS CLARIFICATION## : CCM #: [id number] [id number] - Elize Pinon - Claim No: [id number] DOI: 08-10-21 Lt Wrist/Lt Hand [facility] [email or web address]> #Caution - External email - see footer for warnings#  Tisha,    We received the attached indicating Ms. Pollino is to continue out of work for the next 4 weeks. The LT hand was injured; therefore, can we please inquire what work restrictions look like as a whole (not just for the one hand?)? Seems she should be able to utilize the RT hand for ADL/IADLs and work tasks. Her employer has a very strong RTW program.   Also, please confirm the date/time of the next visit.

## 2021-09-17 NOTE — Telephone Encounter (Signed)
New note has been written for this patient

## 2021-09-22 ENCOUNTER — Encounter: Payer: Medicare HMO | Admitting: Rehabilitative and Restorative Service Providers"

## 2021-09-25 ENCOUNTER — Encounter: Payer: Self-pay | Admitting: Rehabilitative and Restorative Service Providers"

## 2021-09-25 ENCOUNTER — Ambulatory Visit (INDEPENDENT_AMBULATORY_CARE_PROVIDER_SITE_OTHER): Payer: Worker's Compensation | Admitting: Rehabilitative and Restorative Service Providers"

## 2021-09-25 DIAGNOSIS — R600 Localized edema: Secondary | ICD-10-CM

## 2021-09-25 DIAGNOSIS — M25632 Stiffness of left wrist, not elsewhere classified: Secondary | ICD-10-CM

## 2021-09-25 DIAGNOSIS — M6281 Muscle weakness (generalized): Secondary | ICD-10-CM | POA: Diagnosis not present

## 2021-09-25 DIAGNOSIS — M25532 Pain in left wrist: Secondary | ICD-10-CM

## 2021-09-25 DIAGNOSIS — M25642 Stiffness of left hand, not elsewhere classified: Secondary | ICD-10-CM

## 2021-09-25 NOTE — Therapy (Signed)
OUTPATIENT OCCUPATIONAL THERAPY TREATMENT NOTE   Patient Name: April Stevens MRN: [id number] DOB:1955-08-16, 66 y.o., female Today's Date: 09/25/2021  PCP: Dr. Benay Spice [name] REFERRING PROVIDER: Dr. Sherilyn Cooter   END OF SESSION:   OT End of Session - 09/25/21 1105     Visit Number 3    Number of Visits 12    Date for OT Re-Evaluation 11/06/21    Authorization Type WC eval + 12 visits approved    OT Start Time 0093    OT Stop Time 8182    OT Time Calculation (min) 51 min    Activity Tolerance Patient tolerated treatment well;No increased pain;Patient limited by fatigue    Behavior During Therapy [facility] for tasks assessed/performed              Past Medical History:  Diagnosis Date   Depression    Hypertension    [name]    Past Surgical History:  Procedure Laterality Date   APPENDECTOMY  09/26/2018   HYSTERECTOMY ABDOMINAL WITH SALPINGO-OOPHORECTOMY  2000   DUB   JOINT REPLACEMENT     LAPAROSCOPIC APPENDECTOMY N/A 09/26/2018   Procedure: APPENDECTOMY LAPAROSCOPIC;  Surgeon: Kinsinger, Arta Bruce, MD;  Location: [place];  Service: General;  Laterality: N/A;   OPEN REDUCTION INTERNAL FIXATION (ORIF) DISTAL RADIAL FRACTURE Left 08/17/2021   Procedure: OPEN REDUCTION INTERNAL FIXATION (ORIF) LEFT DISTAL RADIAL FRACTURE;  Surgeon: Sherilyn Cooter, MD;  Location: [place];  Service: Orthopedics;  Laterality: Left;   SHOULDER SURGERY Left    01-02-2018, has had R shoulder rotator cuff previously   Patient Active Problem List   Diagnosis Date Noted   Closed fracture of left distal radius 08/14/2021   Perforated diverticulum 02/08/2020   Pneumoperitoneum 02/08/2020   HLD (hyperlipidemia) 02/08/2020   Pure hypercholesterolemia 10/02/2019   Prediabetes 10/02/2019   Hypokalemia 03/29/2019   Acute appendicitis 09/26/2018   Obstructive sleep apnea treated with continuous positive airway pressure (CPAP) 08/01/2017   Inadequate sleep hygiene 02/22/2017    Excessive daytime sleepiness 02/22/2017   Sleep deprivation 02/22/2017   Snoring 02/22/2017   OSA (obstructive sleep apnea) 02/22/2017   HTN (hypertension) 02/23/2012   Anxiety and depression 02/23/2012    ONSET DATE: DOS 08/17/21   REFERRING DIAG: X93.716R (ICD-10-CM) - Other closed intra-articular fracture of distal end of left radius, initial encounter  THERAPY DIAG:  Localized edema  Stiffness of left wrist, not elsewhere classified  Pain in left wrist  Muscle weakness (generalized)  Stiffness of left hand, not elsewhere classified  Rationale for Evaluation and Treatment Rehabilitation  PERTINENT HISTORY: Per MD: "Left distal radius fracture s/p ORIF with volar [plate] and dorsal bridge plate."   PRECAUTIONS: 5+ weeks post op now, avoid pressure on wrist, but A/AROM to shoulder ,elbow FA, hand fine; start light strength at 5-8 weeks post op (plate typically removed at 2-4 months post op)    WEIGHT BEARING RESTRICTIONS Yes NWB in left arm now   SUBJECTIVE:  She states doing well with HEP and not having pain now. Also not needing to wear orthotic much at all now.    PAIN:  Are you having pain? No Rating: 0/10 at rest today in dorsum of left IF MCP J    OBJECTIVE: (All objective assessments below are from initial evaluation on: 09/08/21 unless otherwise specified.)   HAND DOMINANCE: Right    ADLs: Overall ADLs: States decreased ability to grab, hold household objects, pain and inability to open containers, perform all  FMS tasks, etc.      FUNCTIONAL OUTCOME MEASURES: Eval: Quck DASH 41% impairment today    UPPER EXTREMITY ROM    Eval: She can touch all finger tips to palm, but not tuck DIP Js to touch distal palmar crease (1cm gap now). Thumb is a bit stiff and has a gap to base of SF as well.  MCP Js are a bit tight from swelling and perhaps plate placement & MF MCP measured 67*, which is representative of the rest at this point.    Active ROM Left eval Left   09/14/21  Shoulder flexion ~150   Shoulder abduction ~150   Shoulder adduction     Shoulder extension     Shoulder internal rotation     Shoulder external rotation     Elbow flexion 150   Elbow extension 0   Wrist flexion X   Wrist extension X   Wrist ulnar deviation X   Wrist radial deviation X   Wrist pronation 56 74  Wrist supination 71 75  (Blank rows = not tested)   Active ROM Left eval Left 09/14/21 Left 09/25/21  Thumb MCP (0-60)   58 52  Thumb IP (0-80)   27 56  Thumb Radial abd/add (0-55)      Thumb Palmar abd/add (0-45)      Thumb Opposition to Small Finger 2cm gap to base of SF, but can oppose to pad of SF   Can now touch to base of SF  Index MCP (0-90)      Index PIP (0-100)      Index DIP (0-70)      Long MCP (0-90) 0-67*  0-66* 0-72*  Long PIP (0-100)      Long DIP (0-70)      Ring MCP (0-90)      Ring PIP (0-100)      Ring DIP (0-70)      Little MCP (0-90)      Little PIP (0-100)      Little DIP (0-70)      (Blank rows = not tested)     UPPER EXTREMITY MMT:    Eval NT due to too soon out from fx & sx    MMT Left 09/25/21  Elbow flexion  4/5 tender in wrist  Elbow extension 4+/5   Wrist flexion    Wrist extension    Wrist ulnar deviation    Wrist radial deviation    Wrist pronation    Wrist supination    (Blank rows = not tested)   HAND FUNCTION: 09/25/21: Right grip: 62#  Left grip: 19#    COORDINATION: 09/14/21: Box and Blocks Test: 42 Blocks today (61 is WFL)  Eval: Overt impairment in GMS as she cannot move her wrist and FMS are diminished as well due to stiff fingers     SENSATION: Eval: She can feel LT in thumb and pressure, but states Lt thumb is also numb feeling volar and dorsally, also diminished around scar areas mildly    EDEMA:             Eval:  Mildly swollen in left hand and wrist today   COGNITION: Overall cognitive status: WFL for evaluation today    OBSERVATIONS:             Eval: Scars are healing well, but volar  scar seems somewhat adhered compared to the rest. Fingers have decent motion to palm today, and she is encouraged to maintain this, though  MCP Js are a little tight and she states feeling "pulling" and pain around plate area (dorsum of 2nd MC) with strong MCP J flexion.      TODAY'S TREATMENT:  09/25/21:  She starts with AROM and review of current HEP.  OT also adds new strength with therapy bands and therapy putty today (as below), which she tolerates well. Still needs to learn pinches and MCP J flexion strength- will need to do next session. She wears orthotic with arm strength for protection.   Exercises - Standing Shoulder Flexion Full Range  - 3-4 x daily - 1-2 sets - 10-15 reps - Palm Up / Palm Down  - 3-4 x daily - 1-2 sets - 10-15 reps - Forearm Supination Stretch  - 3-4 x daily - 2-3 reps - 15 sec hold - Wrist Pronation Stretch  - 3-4 x daily - 2-3 reps - 15 sec hold - Touch Thumb to [facility] Finger  - 3-4 x daily - 1-2 sets - 10 reps - Tendon Glides  - 3-4 x daily - 3-5 reps - 2-3 seconds hold - BACK KNUCKLE STRETCHES   - 3-4 x daily - 2-3 reps - 15 hold - HOOK Stretch  - 3-4 x daily - 2-3 reps - 15-20 sec hold - Standing Bicep Curls with Resistance  - 2-4 x daily - 1-2 sets - 10-15 reps - Standing Elbow Extension with Self-Anchored Resistance  - 2-3 x daily - 10-15 reps - Standing Row with Anchored Resistance  - 2-3 x daily - 1 sets - 10-15 reps - Full Fist  - 2-3 x daily - 5 reps  Patient Education - Scar Massage     PATIENT EDUCATION: Education details: See tx section above for details  Person educated: Patient Education method: Verbal Instruction, Teach back, Handouts  Education comprehension: States and demonstrates understanding, Additional Education required      HOME EXERCISE PROGRAM: Access Code: [email or web address] URL: [email or web address]    GOALS: Goals reviewed with patient? Yes     SHORT TERM GOALS: (STG required if POC>30 days)   Pt will obtain  protective, custom orthotic. Target date: 09/08/21 Goal status: MET   2.  Pt will demo/state understanding of initial HEP to improve pain levels and prerequisite motion. Target date: 09/18/21 Goal status: 09/14/21: MET     LONG TERM GOALS:   Pt will improve functional ability by decreased impairment per Quick DASH assessment from 41% to 20% or better, for better quality of life. Target date: 11/06/21 Goal status: INITIAL   2.  Pt will improve grip strength in left hand to at least 40lbs for functional use at home and in IADLs. Target date: 11/06/21 Goal status: INITIAL   3.  Pt will improve A/ROM in FA pronation from 56* to at least 70*, to have functional motion for tasks like reach and grasp.  Target date: 11/06/21 Goal status: INITIAL   4.  Pt will improve strength in left forearm to at least 4/5 MMT without pain to have increased functional ability to carry out selfcare and higher-level homecare tasks with no difficulty. Target date: 11/06/21 Goal status: INITIAL   5.  Pt will decrease pain at rest from 5/10 to 1/10 or better to have better sleep and occupational participation in daily roles. Target date: 11/06/21 Goal status: INITIAL     ASSESSMENT:   CLINICAL IMPRESSION: 09/25/21: Tolerating strength well, no pain, has been able to maintain full fist, doing very well. May only need 2 more visits BEFORE  plate removal,and follow up visits after that.   09/14/21: She has improved motion at forearm already, is working hard to prevent her MCP Js from tightening as dorsum of hand heals from plate insertion.  Continue on.   Eval: Patient is a 66 y.o. female who was seen today for occupational therapy evaluation for fractured left wrist, subsequent ORIF, bridge plate, pain, swelling, decreased function. She will benefit from OP OT to increase ability.      PLAN: OT FREQUENCY: 2x/week (tapering as appropriate)    OT DURATION: 8 weeks (through 11/06/21)    PLANNED INTERVENTIONS:  self care/ADL training, therapeutic exercise, therapeutic activity, neuromuscular re-education, manual therapy, scar mobilization, passive range of motion, splinting, electrical stimulation, ultrasound, fluidotherapy, compression bandaging, moist heat, cryotherapy, contrast bath, patient/family education, energy conservation, coping strategies training, and Re-evaluation   RECOMMENDED OTHER SERVICES: none now    CONSULTED AND AGREED WITH PLAN OF CARE: Patient   PLAN FOR NEXT SESSION:  Check HEP, do manual therapy as needed, finish edu on hand strengthening; edu on in-hand manipulations     Benito Mccreedy, OTR/L, CHT 09/25/2021, 12:07 PM

## 2021-09-30 ENCOUNTER — Encounter: Payer: Medicare HMO | Admitting: Rehabilitative and Restorative Service Providers"

## 2021-10-01 ENCOUNTER — Encounter: Payer: Self-pay | Admitting: Rehabilitative and Restorative Service Providers"

## 2021-10-01 ENCOUNTER — Ambulatory Visit (INDEPENDENT_AMBULATORY_CARE_PROVIDER_SITE_OTHER): Payer: Worker's Compensation | Admitting: Rehabilitative and Restorative Service Providers"

## 2021-10-01 DIAGNOSIS — M25642 Stiffness of left hand, not elsewhere classified: Secondary | ICD-10-CM

## 2021-10-01 DIAGNOSIS — M25632 Stiffness of left wrist, not elsewhere classified: Secondary | ICD-10-CM

## 2021-10-01 DIAGNOSIS — M25532 Pain in left wrist: Secondary | ICD-10-CM | POA: Diagnosis not present

## 2021-10-01 DIAGNOSIS — M6281 Muscle weakness (generalized): Secondary | ICD-10-CM

## 2021-10-01 DIAGNOSIS — R600 Localized edema: Secondary | ICD-10-CM

## 2021-10-01 NOTE — Therapy (Signed)
OUTPATIENT OCCUPATIONAL THERAPY TREATMENT & PROGRESS NOTE   Patient Name: April Stevens MRN: [id number] DOB:1955-03-15, 66 y.o., female Today's Date: 10/01/2021  PCP: Dr. Benay Spice [name] REFERRING PROVIDER: Dr. Sherilyn Cooter   END OF SESSION:   OT End of Session - 10/01/21 1147     Visit Number 4    Number of Visits 12    Date for OT Re-Evaluation 11/06/21    Authorization Type WC eval + 12 visits approved    OT Start Time 2353    OT Stop Time 1231    OT Time Calculation (min) 44 min    Activity Tolerance Patient tolerated treatment well;No increased pain;Patient limited by fatigue    Behavior During Therapy [facility] for tasks assessed/performed             Past Medical History:  Diagnosis Date   Depression    Hypertension    [name]    Past Surgical History:  Procedure Laterality Date   APPENDECTOMY  09/26/2018   HYSTERECTOMY ABDOMINAL WITH SALPINGO-OOPHORECTOMY  2000   DUB   JOINT REPLACEMENT     LAPAROSCOPIC APPENDECTOMY N/A 09/26/2018   Procedure: APPENDECTOMY LAPAROSCOPIC;  Surgeon: Kinsinger, Arta Bruce, MD;  Location: [place];  Service: General;  Laterality: N/A;   OPEN REDUCTION INTERNAL FIXATION (ORIF) DISTAL RADIAL FRACTURE Left 08/17/2021   Procedure: OPEN REDUCTION INTERNAL FIXATION (ORIF) LEFT DISTAL RADIAL FRACTURE;  Surgeon: Sherilyn Cooter, MD;  Location: [place];  Service: Orthopedics;  Laterality: Left;   SHOULDER SURGERY Left    01-02-2018, has had R shoulder rotator cuff previously   Patient Active Problem List   Diagnosis Date Noted   Closed fracture of left distal radius 08/14/2021   Perforated diverticulum 02/08/2020   Pneumoperitoneum 02/08/2020   HLD (hyperlipidemia) 02/08/2020   Pure hypercholesterolemia 10/02/2019   Prediabetes 10/02/2019   Hypokalemia 03/29/2019   Acute appendicitis 09/26/2018   Obstructive sleep apnea treated with continuous positive airway pressure (CPAP) 08/01/2017   Inadequate sleep hygiene  02/22/2017   Excessive daytime sleepiness 02/22/2017   Sleep deprivation 02/22/2017   Snoring 02/22/2017   OSA (obstructive sleep apnea) 02/22/2017   HTN (hypertension) 02/23/2012   Anxiety and depression 02/23/2012    ONSET DATE: DOS 08/17/21   REFERRING DIAG: I14.431V (ICD-10-CM) - Other closed intra-articular fracture of distal end of left radius, initial encounter  THERAPY DIAG:  Localized edema  Stiffness of left wrist, not elsewhere classified  Pain in left wrist  Stiffness of left hand, not elsewhere classified  Muscle weakness (generalized)  Rationale for Evaluation and Treatment Rehabilitation  PERTINENT HISTORY: Per MD: "Left distal radius fracture s/p ORIF with volar [plate] and dorsal bridge plate."   PRECAUTIONS: 6 weeks post op now, avoid pressure on wrist, but A/AROM to shoulder ,elbow FA, hand fine; start light strength at 5-8 weeks post op (plate typically removed at 2-4 months post op)    WEIGHT BEARING RESTRICTIONS Yes NWB in left arm now   SUBJECTIVE:  She states doing fairly well, nothing new, no new pains other than accidentally hitting dorsum of hand off her door recently.  Hand strength going well so far.    PAIN:  Are you having pain? Yes Rating: 1/10 at rest today, whacked her hand on door coming in today   OBJECTIVE: (All objective assessments below are from initial evaluation on: 09/08/21 unless otherwise specified.)   HAND DOMINANCE: Right    ADLs: Overall ADLs: 10/01/21: states managing I/ADLs much better now  FUNCTIONAL OUTCOME MEASURES: Eval: Quck DASH 41% impairment today    UPPER EXTREMITY ROM    10/01/21: She has full fist and opposition now, all motions greatly improved and seems to be maximizing while dorsal bridge plate in.   Eval: She can touch all finger tips to palm, but not tuck DIP Js to touch distal palmar crease (1cm gap now). Thumb is a bit stiff and has a gap to base of SF as well.  MCP Js are a bit tight from  swelling and perhaps plate placement & MF MCP measured 67*, which is representative of the rest at this point.    Active ROM Left eval Left  09/14/21  Shoulder flexion ~150   Shoulder abduction ~150   Shoulder adduction     Shoulder extension     Shoulder internal rotation     Shoulder external rotation     Elbow flexion 150   Elbow extension 0   Wrist flexion X   Wrist extension X   Wrist ulnar deviation X   Wrist radial deviation X   Wrist pronation 56 74  Wrist supination 71 75  (Blank rows = not tested)   Active ROM Left eval Left 09/25/21  Thumb MCP (0-60)   52  Thumb IP (0-80)   56  Thumb Radial abd/add (0-55)     Thumb Palmar abd/add (0-45)     Thumb Opposition to Small Finger 2cm gap to base of SF, but can oppose to pad of SF  Can now touch to base of SF  Index MCP (0-90)     Index PIP (0-100)     Index DIP (0-70)     Long MCP (0-90) 0-67*  0-72*  Long PIP (0-100)     Long DIP (0-70)     Ring MCP (0-90)     Ring PIP (0-100)     Ring DIP (0-70)     Little MCP (0-90)     Little PIP (0-100)     Little DIP (0-70)     (Blank rows = not tested)     UPPER EXTREMITY MMT:      MMT Left 09/25/21  Elbow flexion  4/5 tender in wrist  Elbow extension 4+/5   Wrist flexion    Wrist extension    Wrist ulnar deviation    Wrist radial deviation    Wrist pronation    Wrist supination    (Blank rows = not tested)   HAND FUNCTION: 09/25/21: Right grip: 62#  Left grip: 19#    COORDINATION: 09/14/21: Box and Blocks Test: 42 Blocks today (61 is WFL)  Eval: Overt impairment in GMS as she cannot move her wrist and FMS are diminished as well due to stiff fingers     SENSATION: Eval: She can feel LT in thumb and pressure, but states Lt thumb is also numb feeling volar and dorsally, also diminished around scar areas mildly    EDEMA:             Eval:  Mildly swollen in left hand and wrist today   COGNITION: Overall cognitive status: WFL for evaluation today     OBSERVATIONS:             Eval: Scars are healing well, but volar scar seems somewhat adhered compared to the rest. Fingers have decent motion to palm today, and she is encouraged to maintain this, though MCP Js are a little tight and she states feeling "pulling" and pain around plate  area (dorsum of 2nd MC) with strong MCP J flexion.      TODAY'S TREATMENT:  10/01/21: OT reviews full HEP with her and she demo's having full fist, full opposition and looser looking FA motion.  OT reviews recently learned hand strengthening activities and adds the below activities as tolerated to HEP.  She performs them back well, no increased pain, but difficult.  She asks about her concern about getting her DBP out, OT recommends taking to her surgeon.  OT also gives written directions for after DBP removed to start moving 5-7 days post op unless a physician tells her it is unsafe to do so.  Volar scar slightly adherent distally, so OT does cupping scar mobilization, and she states it's feeling looser at the end.   Exercises - Full Fist  - 2-3 x daily - 5 reps - "Duck Mouth" Strength  - 2-3 x daily - 5 reps - Finger Extension "Pizza!"   - 2-3 x daily - 5 reps - Thumb Opposition with Putty  - 2-3 x daily - 5 reps    PATIENT EDUCATION: Education details: See tx section above for details  Person educated: Patient Education method: Verbal Instruction, Teach back, Handouts  Education comprehension: States and demonstrates understanding, Additional Education required      HOME EXERCISE PROGRAM: Access Code: [email or web address] URL: [email or web address]    GOALS: Goals reviewed with patient? Yes     SHORT TERM GOALS: (STG required if POC>30 days)   Pt will obtain protective, custom orthotic. Target date: 09/08/21 Goal status: MET   2.  Pt will demo/state understanding of initial HEP to improve pain levels and prerequisite motion. Target date: 09/18/21 Goal status: 09/14/21: MET     LONG TERM  GOALS:   Pt will improve functional ability by decreased impairment per Quick DASH assessment from 41% to 20% or better, for better quality of life. Target date: 11/06/21 Goal status: INITIAL   2.  Pt will improve grip strength in left hand to at least 40lbs for functional use at home and in IADLs. Target date: 11/06/21 Goal status: INITIAL   3.  Pt will improve A/ROM in FA pronation from 56* to at least 70*, to have functional motion for tasks like reach and grasp.  Target date: 11/06/21 Goal status: INITIAL   4.  Pt will improve strength in left forearm to at least 4/5 MMT without pain to have increased functional ability to carry out selfcare and higher-level homecare tasks with no difficulty. Target date: 11/06/21 Goal status: INITIAL   5.  Pt will decrease pain at rest from 5/10 to 1/10 or better to have better sleep and occupational participation in daily roles. Target date: 11/06/21 Goal status: INITIAL     ASSESSMENT:   CLINICAL IMPRESSION: 10/01/21: She has full fist and opposition now, all motions greatly improved and seems to be maximizing while dorsal bridge plate in. We discussed f/u in ~2 weeks, then again after DBP is removed.     PLAN: OT FREQUENCY: 2x/week (tapering as appropriate)    OT DURATION: 8 weeks (through 11/06/21)    PLANNED INTERVENTIONS: self care/ADL training, therapeutic exercise, therapeutic activity, neuromuscular re-education, manual therapy, scar mobilization, passive range of motion, splinting, electrical stimulation, ultrasound, fluidotherapy, compression bandaging, moist heat, cryotherapy, contrast bath, patient/family education, energy conservation, coping strategies training, and Re-evaluation   RECOMMENDED OTHER SERVICES: none now    CONSULTED AND AGREED WITH PLAN OF CARE: Patient   PLAN FOR NEXT SESSION:  Check motion and  strength and coping. Edu on in-hand manipulations   Prepare for next sx and post sx therapy.   Benito Mccreedy,  OTR/L, CHT 10/01/2021, 2:34 PM

## 2021-10-15 ENCOUNTER — Encounter: Payer: Medicare PPO | Admitting: Rehabilitative and Restorative Service Providers"

## 2021-10-21 ENCOUNTER — Ambulatory Visit (INDEPENDENT_AMBULATORY_CARE_PROVIDER_SITE_OTHER): Payer: Worker's Compensation | Admitting: Rehabilitative and Restorative Service Providers"

## 2021-10-21 ENCOUNTER — Encounter: Payer: Self-pay | Admitting: Rehabilitative and Restorative Service Providers"

## 2021-10-21 DIAGNOSIS — R600 Localized edema: Secondary | ICD-10-CM | POA: Diagnosis not present

## 2021-10-21 DIAGNOSIS — M25632 Stiffness of left wrist, not elsewhere classified: Secondary | ICD-10-CM

## 2021-10-21 DIAGNOSIS — M25642 Stiffness of left hand, not elsewhere classified: Secondary | ICD-10-CM

## 2021-10-21 DIAGNOSIS — M6281 Muscle weakness (generalized): Secondary | ICD-10-CM | POA: Diagnosis not present

## 2021-10-21 DIAGNOSIS — M25532 Pain in left wrist: Secondary | ICD-10-CM

## 2021-10-21 NOTE — Therapy (Signed)
OUTPATIENT OCCUPATIONAL THERAPY TREATMENT & PROGRESS NOTE   Patient Name: April Stevens MRN: [id number] DOB:[birth year], 66 y.o., female Today's Date: 10/21/2021  PCP: Dr. Hiram Comber REFERRING PROVIDER: Dr. Sherilyn Cooter   Progress Note  Reporting Period 09/08/21 to 10/21/21  See note below for Objective Data and Assessment of Progress/Goals.      END OF SESSION:   OT End of Session - 10/21/21 1303     Visit Number 5    Number of Visits 12    Date for OT Re-Evaluation 12/11/21    Authorization Type WC eval + 12 visits approved    OT Start Time 8280    OT Stop Time 0349    OT Time Calculation (min) 50 min    Activity Tolerance Patient tolerated treatment well;No increased pain    Behavior During Therapy WFL for tasks assessed/performed             Past Medical History:  Diagnosis Date   Depression    Hypertension    [name]    Past Surgical History:  Procedure Laterality Date   APPENDECTOMY  09/26/2018   HYSTERECTOMY ABDOMINAL WITH SALPINGO-OOPHORECTOMY  2000   DUB   JOINT REPLACEMENT     LAPAROSCOPIC APPENDECTOMY N/A 09/26/2018   Procedure: APPENDECTOMY LAPAROSCOPIC;  Surgeon: Kinsinger, Arta Bruce, MD;  Location: [place];  Service: General;  Laterality: N/A;   OPEN REDUCTION INTERNAL FIXATION (ORIF) DISTAL RADIAL FRACTURE Left 08/17/2021   Procedure: OPEN REDUCTION INTERNAL FIXATION (ORIF) LEFT DISTAL RADIAL FRACTURE;  Surgeon: Sherilyn Cooter, MD;  Location: [place];  Service: Orthopedics;  Laterality: Left;   SHOULDER SURGERY Left    01-02-2018, has had R shoulder rotator cuff previously   Patient Active Problem List   Diagnosis Date Noted   Closed fracture of left distal radius 08/14/2021   Perforated diverticulum 02/08/2020   Pneumoperitoneum 02/08/2020   HLD (hyperlipidemia) 02/08/2020   Pure hypercholesterolemia 10/02/2019   Prediabetes 10/02/2019   Hypokalemia 03/29/2019   Acute appendicitis 09/26/2018   Obstructive  sleep apnea treated with continuous positive airway pressure (CPAP) 08/01/2017   Inadequate sleep hygiene 02/22/2017   Excessive daytime sleepiness 02/22/2017   Sleep deprivation 02/22/2017   Snoring 02/22/2017   OSA (obstructive sleep apnea) 02/22/2017   HTN (hypertension) 02/23/2012   Anxiety and depression 02/23/2012    ONSET DATE: DOS 08/17/21   REFERRING DIAG: Z79.150V (ICD-10-CM) - Other closed intra-articular fracture of distal end of left radius, initial encounter  THERAPY DIAG:  Localized edema  Stiffness of left wrist, not elsewhere classified  Pain in left wrist  Muscle weakness (generalized)  Stiffness of left hand, not elsewhere classified  Rationale for Evaluation and Treatment Rehabilitation  PERTINENT HISTORY: Per MD: "Left distal radius fracture s/p ORIF with volar [plate] and dorsal bridge plate."   PRECAUTIONS: ~9 weeks post initial sx now- all as tolerated, caution for wrist motion with plate. Pending new precautions after plate removal scheduled for 10/29/21.    WEIGHT BEARING RESTRICTIONS Yes NWB in left arm now   SUBJECTIVE:  She states she's scheduled for DBP removal next Thursday. OT advises her to return to OT on the 19th a week after for wound/orthotic check and new HEP initiation. No more thumb tingling/numbness now.     PAIN:  Are you having pain? Yes Rating: 1/10 at rest today, still uncomfortable with tight fist.    OBJECTIVE: (All objective assessments below are from initial evaluation on: 09/08/21 unless otherwise specified.)  HAND DOMINANCE: Right    ADLs: Overall ADLs: 10/01/21: states managing I/ADLs much better now, still has mild problems on average     FUNCTIONAL OUTCOME MEASURES: 10/21/21:Quick DASH 25% impairment today   Eval: Quck DASH 41% impairment today    UPPER EXTREMITY ROM    10/21/21: Full fist, full opposition today Lt hand.   Eval: She can touch all finger tips to palm, but not tuck DIP Js to touch distal  palmar crease (1cm gap now). Thumb is a bit stiff and has a gap to base of SF as well.  MCP Js are a bit tight from swelling and perhaps plate placement & MF MCP measured 67*, which is representative of the rest at this point.    Active ROM Left eval Left  09/14/21 Lt  10/21/21  Shoulder flexion ~150  ~160  Shoulder abduction ~150  ~160  Elbow flexion 150  WNL  Elbow extension 0  WNL  Wrist flexion X  x  Wrist extension X  x  Wrist ulnar deviation X  x  Wrist radial deviation X  x  Wrist pronation 56 74 64  Wrist supination 71 75 75  (Blank rows = not tested)   Active ROM Left eval Left 09/25/21 Lt  10/21/21  Thumb MCP (0-60)   52   Thumb IP (0-80)   56   Thumb Radial abd/add (0-55)      Thumb Palmar abd/add (0-45)      Thumb Opposition to Small Finger 2cm gap to base of SF, but can oppose to pad of SF  Can now touch to base of SF Full opposition to base SF  Index MCP (0-90)      Index PIP (0-100)      Index DIP (0-70)      Long MCP (0-90) 0-67*  0-72* 0-77*  (Blank rows = not tested)     UPPER EXTREMITY MMT:      MMT Left 09/25/21 Lt  10/21/21  Elbow flexion  4/5 tender in wrist 5/5  Elbow extension 4+/5  5/5  Wrist flexion     Wrist extension     Wrist ulnar deviation     Wrist radial deviation     Wrist pronation   5/5  Wrist supination   5/5  (Blank rows = not tested)   HAND FUNCTION: 10/21/21: 36# Lt grip   09/25/21: Right grip: 62#  Left grip: 19#    COORDINATION: 10/21/21: Box and Blocks Test: 59 Blocks today (61 is WFL)  09/14/21: Box and Blocks Test: 42 Blocks today (61 is WFL)   SENSATION: 10/21/21: states only mildly numb by sx area on 2nd MC now.   Eval: She can feel LT in thumb and pressure, but states Lt thumb is also numb feeling volar and dorsally, also diminished around scar areas mildly    EDEMA:            10/21/21: none significant now (20.2cm circumferential around MCP Js)  OBSERVATIONS:            10/21/21: fully healed, moving great,  function improved.  Only mild- moderate deficits rated for function now.      TODAY'S TREATMENT:  10/21/21: Pt performs AROM, gripping, and strength with Lt arm against therapist's resistance for exercise/activities as well as new measures today. Using that data, OT also reviews home exercises and provides HEP and recommendations for post-op 2nd surgery on 10/29/21, as below. OT also discusses home and functional tasks with the  pt and reviews goals. Pt states understanding and tolerates all well.   Exercises to resume 1-3 days post-op at tolerated:  - Standing Shoulder Flexion Full Range  - 3-4 x daily - 1-2 sets - 10-15 reps - Standing Elbow Flexion Extension AROM  - 4-6 x daily - 10-15 reps - Palm Up / Palm Down  - 3-4 x daily - 1-2 sets - 10-15 reps - Touch Thumb to [facility] Finger  - 3-4 x daily - 1-2 sets - 10 reps - Tendon Glides  - 3-4 x daily - 3-5 reps - 2-3 seconds hold    PATIENT EDUCATION: Education details: See tx section above for details  Person educated: Patient Education method: Veterinary surgeon, Teach back, Handouts  Education comprehension: States and demonstrates understanding, Additional Education required      HOME EXERCISE PROGRAM: Access Code: [email or web address] URL: [email or web address]    GOALS: Goals reviewed with patient? Yes     SHORT TERM GOALS: (STG required if POC>30 days)   Pt will obtain protective, custom orthotic. Target date: 09/08/21 Goal status: MET   2.  Pt will demo/state understanding of initial HEP to improve pain levels and prerequisite motion. Target date: 09/18/21 Goal status: 09/14/21: MET     LONG TERM GOALS:   Pt will improve functional ability by decreased impairment per Quick DASH assessment from 41% to 20% or better, for better quality of life. Target date: 12/11/21 Goal status: 10/21/21: progressing- 25% now   2.  Pt will improve grip strength in left hand to at least 40lbs for functional use at home and in IADLs. Target  date: 12/11/21 Goal status: 10/21/21: progressing 36# now    3.  Pt will improve A/ROM in FA pronation from 56* to at least 70*, to have functional motion for tasks like reach and grasp.  Target date: 12/11/21 Goal status: 10/21/21: progressing- 64* now    4.  Pt will improve strength in left forearm to at least 4/5 MMT without pain to have increased functional ability to carry out selfcare and higher-level homecare tasks with no difficulty. Target date: 12/11/21 Goal status: 10/21/21: MET today but will need reassessed post 2nd surgery.    5.  Pt will decrease pain at rest from 5/10 to 1/10 or better to have better sleep and occupational participation in daily roles. Target date: 12/11/21 Goal status: 10/21/21: MET but will need reassessed after 2nd surgery     ASSESSMENT:   CLINICAL IMPRESSION: 10/21/21: She has done great to maximize ROM, strength before plate removal surgery.  She has HEP plan to begin (shoulder, elbow, finger motion) to begin immediately post op as tolerated, and we have a plan to continue therapy after 2nd surgery starting Oct 19th.      PLAN: OT FREQUENCY: 2x/week (tapering as appropriate) (currently approved 12 visits, used 5 from [facility])    OT DURATION: additional 5 weeks (from 11/06/21 - 12/11/21)    PLANNED INTERVENTIONS: self care/ADL training, therapeutic exercise, therapeutic activity, neuromuscular re-education, manual therapy, scar mobilization, passive range of motion, splinting, electrical stimulation, ultrasound, fluidotherapy, compression bandaging, moist heat, cryotherapy, contrast bath, patient/family education, energy conservation, coping strategies training, and Re-evaluation   RECOMMENDED OTHER SERVICES: none now    CONSULTED AND AGREED WITH PLAN OF CARE: Patient   PLAN FOR NEXT SESSION:  F/U on Oct 19th- this will be 7 days post DBP removal. OT will remove bulky dressing, ensure removable orthotic fitting well, keep sutures clean/covered, take new  measures and start her doing  AROM from shoulder to fingers again, per protocols.    Benito Mccreedy, OTR/L, CHT 10/21/2021, 4:58 PM

## 2021-11-04 NOTE — Therapy (Signed)
OUTPATIENT OCCUPATIONAL THERAPY TREATMENT NOTE   Patient Name: April Stevens MRN: [id number] DOB:1955/06/22, 66 y.o., female Today's Date: 11/05/2021  PCP: Dr. Benay Spice [name] REFERRING PROVIDER: Dr. Sherilyn Cooter   END OF SESSION:   OT End of Session - 11/05/21 1521     Visit Number 6    Number of Visits 12    Date for OT Re-Evaluation 12/11/21    Authorization Type WC eval + 12 visits approved    OT Start Time 7654    OT Stop Time 1607    OT Time Calculation (min) 46 min    Activity Tolerance Patient tolerated treatment well;No increased pain;Patient limited by pain;Patient limited by fatigue    Behavior During Therapy [facility] for tasks assessed/performed              Past Medical History:  Diagnosis Date   Depression    Hypertension    [name]    Past Surgical History:  Procedure Laterality Date   APPENDECTOMY  09/26/2018   HYSTERECTOMY ABDOMINAL WITH SALPINGO-OOPHORECTOMY  2000   DUB   JOINT REPLACEMENT     LAPAROSCOPIC APPENDECTOMY N/A 09/26/2018   Procedure: APPENDECTOMY LAPAROSCOPIC;  Surgeon: Kinsinger, Arta Bruce, MD;  Location: [place];  Service: General;  Laterality: N/A;   OPEN REDUCTION INTERNAL FIXATION (ORIF) DISTAL RADIAL FRACTURE Left 08/17/2021   Procedure: OPEN REDUCTION INTERNAL FIXATION (ORIF) LEFT DISTAL RADIAL FRACTURE;  Surgeon: Sherilyn Cooter, MD;  Location: [place];  Service: Orthopedics;  Laterality: Left;   SHOULDER SURGERY Left    01-02-2018, has had R shoulder rotator cuff previously   Patient Active Problem List   Diagnosis Date Noted   Closed fracture of left distal radius 08/14/2021   Perforated diverticulum 02/08/2020   Pneumoperitoneum 02/08/2020   HLD (hyperlipidemia) 02/08/2020   Pure hypercholesterolemia 10/02/2019   Prediabetes 10/02/2019   Hypokalemia 03/29/2019   Acute appendicitis 09/26/2018   Obstructive sleep apnea treated with continuous positive airway pressure (CPAP) 08/01/2017    Inadequate sleep hygiene 02/22/2017   Excessive daytime sleepiness 02/22/2017   Sleep deprivation 02/22/2017   Snoring 02/22/2017   OSA (obstructive sleep apnea) 02/22/2017   HTN (hypertension) 02/23/2012   Anxiety and depression 02/23/2012    ONSET DATE: DOS 08/17/21   REFERRING DIAG: Y50.354S (ICD-10-CM) - Other closed intra-articular fracture of distal end of left radius, initial encounter  THERAPY DIAG:  Localized edema  Stiffness of left wrist, not elsewhere classified  Pain in left wrist  Stiffness of left hand, not elsewhere classified  Muscle weakness (generalized)  Rationale for Evaluation and Treatment Rehabilitation  PERTINENT HISTORY: Per MD: "Left distal radius fracture s/p ORIF with volar [plate] and dorsal bridge plate."   PRECAUTIONS: 1 week s/p DSBP removal. A/A/PROM 4-6 x day 25 reps with end-range long stretches, edema control, resting orthosis; @ 3 weeks start weaning and dorsal taping as needed; 4-6 weeks weighted stretches & dynamics; @6  weeks PRE; return to full use 8-10 weeks.    WEIGHT BEARING RESTRICTIONS Yes NWB in left arm now   SUBJECTIVE:  She returns after DSBP removal. She states having mild pain with making full fist, has been doing AROM gently at wrist as asked. In bulky dressing today with sutures in place.    PAIN:  Are you having pain? Yes  Rating:2-3/10 at rest today, uncomfortable with tight fist.    OBJECTIVE: (All objective assessments below are from initial evaluation on: 09/08/21 unless otherwise specified.)   HAND DOMINANCE: Right  ADLs: Overall ADLs: 10/01/21: states managing I/ADLs much better now, still has mild problems on average     FUNCTIONAL OUTCOME MEASURES: 10/21/21:Quick DASH 25% impairment today   Eval: Quck DASH 41% impairment today    UPPER EXTREMITY ROM    11/05/21: she has relatively painless, but tight, limited wrist motion today, making loose full fist still, can oppose, etc.   10/21/21: Full fist,  full opposition today Lt hand.   Eval: She can touch all finger tips to palm, but not tuck DIP Js to touch distal palmar crease (1cm gap now). Thumb is a bit stiff and has a gap to base of SF as well.  MCP Js are a bit tight from swelling and perhaps plate placement & MF MCP measured 67*, which is representative of the rest at this point.    Active ROM Left eval Left  09/14/21 Lt  10/21/21 Lt 11/05/21  Shoulder flexion ~150  ~160   Shoulder abduction ~150  ~160   Elbow flexion 150  WNL   Elbow extension 0  WNL   Wrist flexion X  x 10  Wrist extension X  x 42  Wrist ulnar deviation X  x 27  Wrist radial deviation X  x (-5)  Wrist pronation 56 74 64 71  Wrist supination 71 75 75 70  (Blank rows = not tested)   Active ROM Left eval Left 09/25/21 Lt  10/21/21  Thumb MCP (0-60)   52   Thumb IP (0-80)   56   Thumb Radial abd/add (0-55)      Thumb Palmar abd/add (0-45)      Thumb Opposition to Small Finger 2cm gap to base of SF, but can oppose to pad of SF  Can now touch to base of SF Full opposition to base SF  Index MCP (0-90)      Index PIP (0-100)      Index DIP (0-70)      Long MCP (0-90) 0-67*  0-72* 0-77*  (Blank rows = not tested)     UPPER EXTREMITY MMT:      MMT Left 09/25/21 Lt  10/21/21  Elbow flexion  4/5 tender in wrist 5/5  Elbow extension 4+/5  5/5  Wrist flexion     Wrist extension     Wrist ulnar deviation     Wrist radial deviation     Wrist pronation   5/5  Wrist supination   5/5  (Blank rows = not tested)   HAND FUNCTION: 10/21/21: 36# Lt grip   09/25/21: Right grip: 62#  Left grip: 19#    COORDINATION: 10/21/21: Box and Blocks Test: 59 Blocks today (61 is WFL)  09/14/21: Box and Blocks Test: 42 Blocks today (61 is WFL)   SENSATION: 10/21/21: states only mildly numb by sx area on 2nd MC now.   Eval: She can feel LT in thumb and pressure, but states Lt thumb is also numb feeling volar and dorsally, also diminished around scar areas mildly    EDEMA:            11/05/21: increased swelling post op in FA and wrist now.    10/21/21: none significant now (20.2cm circumferential around MCP Js)  OBSERVATIONS:          11/05/21: sutures clean, in place, no signs of infection, etc.     10/21/21: fully healed, moving great, function improved.  Only mild- moderate deficits rated for function now.      TODAY'S TREATMENT:  11/05/21: OT removes bulky dressing, assesses sutures (look good), redresses with xeroform and band-aides.  OT edu to keep clean/dry, covered until MD sees her again.  OT also edu for post op precautions, no gripping squeezing, lifting again, until advised by OT.   OT adjusted orthotic slightly to avoid pressure on sutures. OT gives new HEP for post op including AROM and stretches at wrist and hand in all planes (as below). She tolerates well, not overly painful.   Exercises - Standing Shoulder Flexion Full Range  - 3-4 x daily - 1-2 sets - 10-15 reps - Standing Elbow Flexion Extension AROM  - 4-6 x daily - 10-15 reps - Palm Up / Palm Down  - 3-4 x daily - 1-2 sets - 10-15 reps - Bend and Pull Back Wrist SLOWLY  - 4 x daily - 10-15 reps - "Windshield Wipers"   - 4 x daily - 10-15 reps - Wrist AROM Dart Throwers Motion  - 4 x daily - 10-15 reps - Forearm Supination Stretch  - 3-4 x daily - 2-3 reps - 15 sec hold - Touch Thumb to [facility] Finger  - 3-4 x daily - 1-2 sets - 10 reps - Tendon Glides  - 3-4 x daily - 3-5 reps - 2-3 seconds hold - BACK KNUCKLE STRETCHES   - 3-4 x daily - 2-3 reps - 15 hold - HOOK Stretch  - 3-4 x daily - 2-3 reps - 15-20 sec hold Patient Education - Scar Massage   PATIENT EDUCATION: Education details: See tx section above for details  Person educated: Patient Education method: Verbal Instruction, Teach back, Handouts  Education comprehension: States and demonstrates understanding, Additional Education required      HOME EXERCISE PROGRAM: Access Code: [email or web address] URL:  [email or web address]    GOALS: Goals reviewed with patient? Yes     SHORT TERM GOALS: (STG required if POC>30 days)   Pt will obtain protective, custom orthotic. Target date: 09/08/21 Goal status: MET   2.  Pt will demo/state understanding of initial HEP to improve pain levels and prerequisite motion. Target date: 09/18/21 Goal status: 09/14/21: MET     LONG TERM GOALS:   Pt will improve functional ability by decreased impairment per Quick DASH assessment from 41% to 20% or better, for better quality of life. Target date: 12/11/21 Goal status: 10/21/21: progressing- 25% now   2.  Pt will improve grip strength in left hand to at least 40lbs for functional use at home and in IADLs. Target date: 12/11/21 Goal status: 10/21/21: progressing 36# now    3.  Pt will improve A/ROM in FA pronation from 56* to at least 70*, to have functional motion for tasks like reach and grasp.  Target date: 12/11/21 Goal status: 10/21/21: progressing- 64* now    4.  Pt will improve strength in left forearm to at least 4/5 MMT without pain to have increased functional ability to carry out selfcare and higher-level homecare tasks with no difficulty. Target date: 12/11/21 Goal status: 10/21/21: MET today but will need reassessed post 2nd surgery.    5.  Pt will decrease pain at rest from 5/10 to 1/10 or better to have better sleep and occupational participation in daily roles. Target date: 12/11/21 Goal status: 10/21/21: MET but will need reassessed after 2nd surgery     ASSESSMENT:   CLINICAL IMPRESSION: 11/05/21: Sh is doing very well post op, decent wrist motion to begin, difficulty with radial deviation, however.     PLAN: OT FREQUENCY:  2x/week (tapering as appropriate) (currently approved 12 visits, used 5 from [facility])    OT DURATION: additional 5 weeks (from 11/06/21 - 12/11/21)    PLANNED INTERVENTIONS: self care/ADL training, therapeutic exercise, therapeutic activity,  neuromuscular re-education, manual therapy, scar mobilization, passive range of motion, splinting, electrical stimulation, ultrasound, fluidotherapy, compression bandaging, moist heat, cryotherapy, contrast bath, patient/family education, energy conservation, coping strategies training, and Re-evaluation   RECOMMENDED OTHER SERVICES: none now    CONSULTED AND AGREED WITH PLAN OF CARE: Patient   PLAN FOR NEXT SESSION:  Check HEP and upgrade as appropriate, per protocols.    Benito Mccreedy, OTR/L, CHT 11/05/2021, 5:26 PM

## 2021-11-05 ENCOUNTER — Ambulatory Visit (INDEPENDENT_AMBULATORY_CARE_PROVIDER_SITE_OTHER): Payer: Worker's Compensation | Admitting: Rehabilitative and Restorative Service Providers"

## 2021-11-05 ENCOUNTER — Encounter: Payer: Self-pay | Admitting: Rehabilitative and Restorative Service Providers"

## 2021-11-05 DIAGNOSIS — R600 Localized edema: Secondary | ICD-10-CM | POA: Diagnosis not present

## 2021-11-05 DIAGNOSIS — M25642 Stiffness of left hand, not elsewhere classified: Secondary | ICD-10-CM

## 2021-11-05 DIAGNOSIS — M25632 Stiffness of left wrist, not elsewhere classified: Secondary | ICD-10-CM

## 2021-11-05 DIAGNOSIS — M25532 Pain in left wrist: Secondary | ICD-10-CM | POA: Diagnosis not present

## 2021-11-05 DIAGNOSIS — M6281 Muscle weakness (generalized): Secondary | ICD-10-CM

## 2021-11-17 ENCOUNTER — Encounter: Payer: Self-pay | Admitting: Rehabilitative and Restorative Service Providers"

## 2021-11-17 ENCOUNTER — Ambulatory Visit (INDEPENDENT_AMBULATORY_CARE_PROVIDER_SITE_OTHER): Payer: Medicare PPO | Admitting: Rehabilitative and Restorative Service Providers"

## 2021-11-17 DIAGNOSIS — R600 Localized edema: Secondary | ICD-10-CM | POA: Diagnosis not present

## 2021-11-17 DIAGNOSIS — M25532 Pain in left wrist: Secondary | ICD-10-CM

## 2021-11-17 DIAGNOSIS — M6281 Muscle weakness (generalized): Secondary | ICD-10-CM

## 2021-11-17 DIAGNOSIS — M25642 Stiffness of left hand, not elsewhere classified: Secondary | ICD-10-CM

## 2021-11-17 DIAGNOSIS — M25632 Stiffness of left wrist, not elsewhere classified: Secondary | ICD-10-CM

## 2021-11-17 NOTE — Therapy (Signed)
OUTPATIENT OCCUPATIONAL THERAPY TREATMENT NOTE   Patient Name: April Stevens MRN: [id number] DOB:06-Aug-1955, 66 y.o., female Today's Date: 11/17/2021  PCP: Dr. Benay Spice [name] REFERRING PROVIDER: Dr. Sherilyn Cooter   END OF SESSION:   OT End of Session - 11/17/21 1434     Visit Number 7    Number of Visits 24    Date for OT Re-Evaluation 12/11/21    Authorization Type WC eval + 24 visits approved    Authorization - Number of Visits 24    OT Start Time 2707    OT Stop Time 1515    OT Time Calculation (min) 41 min    Activity Tolerance Patient tolerated treatment well;Patient limited by pain;Patient limited by fatigue    Behavior During Therapy [facility] for tasks assessed/performed               Past Medical History:  Diagnosis Date   Depression    Hypertension    [name]    Past Surgical History:  Procedure Laterality Date   APPENDECTOMY  09/26/2018   HYSTERECTOMY ABDOMINAL WITH SALPINGO-OOPHORECTOMY  2000   DUB   JOINT REPLACEMENT     LAPAROSCOPIC APPENDECTOMY N/A 09/26/2018   Procedure: APPENDECTOMY LAPAROSCOPIC;  Surgeon: Kinsinger, Arta Bruce, MD;  Location: [place];  Service: General;  Laterality: N/A;   OPEN REDUCTION INTERNAL FIXATION (ORIF) DISTAL RADIAL FRACTURE Left 08/17/2021   Procedure: OPEN REDUCTION INTERNAL FIXATION (ORIF) LEFT DISTAL RADIAL FRACTURE;  Surgeon: Sherilyn Cooter, MD;  Location: [place];  Service: Orthopedics;  Laterality: Left;   SHOULDER SURGERY Left    01-02-2018, has had R shoulder rotator cuff previously   Patient Active Problem List   Diagnosis Date Noted   Closed fracture of left distal radius 08/14/2021   Perforated diverticulum 02/08/2020   Pneumoperitoneum 02/08/2020   HLD (hyperlipidemia) 02/08/2020   Pure hypercholesterolemia 10/02/2019   Prediabetes 10/02/2019   Hypokalemia 03/29/2019   Acute appendicitis 09/26/2018   Obstructive sleep apnea treated with continuous positive airway pressure (CPAP)  08/01/2017   Inadequate sleep hygiene 02/22/2017   Excessive daytime sleepiness 02/22/2017   Sleep deprivation 02/22/2017   Snoring 02/22/2017   OSA (obstructive sleep apnea) 02/22/2017   HTN (hypertension) 02/23/2012   Anxiety and depression 02/23/2012    ONSET DATE: DOS 08/17/21, 10/29/21 (second surgery)    REFERRING DIAG: E67.544B (ICD-10-CM) - Other closed intra-articular fracture of distal end of left radius, initial encounter  THERAPY DIAG:  Localized edema  Stiffness of left wrist, not elsewhere classified  Pain in left wrist  Muscle weakness (generalized)  Stiffness of left hand, not elsewhere classified  Rationale for Evaluation and Treatment Rehabilitation  PERTINENT HISTORY: Per MD: "Left distal radius fracture s/p ORIF with volar [plate] and dorsal bridge plate."   PRECAUTIONS: 2.5 week s/p DSBP removal. A/A/PROM 4-6 x day 25 reps with end-range long stretches, edema control, resting orthosis; @ 3 weeks start weaning and dorsal taping as needed; 4-6 weeks weighted stretches & dynamics; _0  weeks PRE; return to full use 8-10 weeks.    WEIGHT BEARING RESTRICTIONS Yes NWB in left arm now   SUBJECTIVE:  She returns after ~ 2weeks and suture removal. She states doing well, doc says x-rays show healing and good progress. She states eager to go back to work ASAP.     PAIN:  Are you having pain? No - not at rest  Rating:0/10 at rest today, uncomfortable with tight fist.  Pain does go up to 5/10 when  trying to open tight jar in past week.    OBJECTIVE: (All objective assessments below are from initial evaluation on: 09/08/21 unless otherwise specified.)   HAND DOMINANCE: Right    ADLs: Overall ADLs: 10/01/21: states managing I/ADLs much better now, still has mild problems on average     FUNCTIONAL OUTCOME MEASURES: 10/21/21:Quick DASH 25% impairment today   Eval: Quck DASH 41% impairment today    UPPER EXTREMITY ROM    11/05/21: she has relatively painless,  but tight, limited wrist motion today, making loose full fist still, can oppose, etc.   10/21/21: Full fist, full opposition today Lt hand.   Eval: She can touch all finger tips to palm, but not tuck DIP Js to touch distal palmar crease (1cm gap now). Thumb is a bit stiff and has a gap to base of SF as well.  MCP Js are a bit tight from swelling and perhaps plate placement & MF MCP measured 67*, which is representative of the rest at this point.    Active ROM Left eval Left  09/14/21 Lt  10/21/21 Lt 11/05/21 Lt 11/17/21  Shoulder flexion ~150  ~160    Shoulder abduction ~150  ~160    Elbow flexion 150  WNL    Elbow extension 0  WNL    Wrist flexion X  x 10 24  Wrist extension X  x 42 53  Wrist ulnar deviation X  x 27 42  Wrist radial deviation X  x (-5) 0  Wrist pronation 56 74 64 71   Wrist supination 71 75 75 70   (Blank rows = not tested)   Active ROM Left eval Left 09/25/21 Lt  10/21/21  Thumb MCP (0-60)   52   Thumb IP (0-80)   56   Thumb Radial abd/add (0-55)      Thumb Palmar abd/add (0-45)      Thumb Opposition to Small Finger 2cm gap to base of SF, but can oppose to pad of SF  Can now touch to base of SF Full opposition to base SF  Index MCP (0-90)      Index PIP (0-100)      Index DIP (0-70)      Long MCP (0-90) 0-67*  0-72* 0-77*  (Blank rows = not tested)     UPPER EXTREMITY MMT:      MMT Left 09/25/21 Lt  10/21/21  Elbow flexion  4/5 tender in wrist 5/5  Elbow extension 4+/5  5/5  Wrist flexion     Wrist extension     Wrist ulnar deviation     Wrist radial deviation     Wrist pronation   5/5  Wrist supination   5/5  (Blank rows = not tested)   HAND FUNCTION: 10/21/21: 36# Lt grip   09/25/21: Right grip: 62#  Left grip: 19#    COORDINATION: 10/21/21: Box and Blocks Test: 59 Blocks today (61 is WFL)  09/14/21: Box and Blocks Test: 42 Blocks today (61 is WFL)   SENSATION: 10/21/21: states only mildly numb by sx area on 2nd MC now.   Eval: She can feel  LT in thumb and pressure, but states Lt thumb is also numb feeling volar and dorsally, also diminished around scar areas mildly    EDEMA:           11/05/21: increased swelling post op in FA and wrist now.    10/21/21: none significant now (20.2cm circumferential around MCP Js)  OBSERVATIONS:  11/05/21: sutures clean, in place, no signs of infection, etc.     10/21/21: fully healed, moving great, function improved.  Only mild- moderate deficits rated for function now.      TODAY'S TREATMENT:  11/17/21: She does new AROM for measures which are all much better now than 7 days post-op.  Radial deviation is the tightest margin and so OT tries light Grade 2-3 joint mobs for scaphoid and also teaches thumb stretches, hoping that distracting CMC J will cause less pinching/binding on radial side of wrist with motion. We review the rest of her HEP as well, and OT reminds not to lift more than 5# now, use caution, no heavy lifting or grip training yet.   11/05/21: OT removes bulky dressing, assesses sutures (look good), redresses with xeroform and band-aides.  OT edu to keep clean/dry, covered until MD sees her again.  OT also edu for post op precautions, no gripping squeezing, lifting again, until advised by OT.   OT adjusted orthotic slightly to avoid pressure on sutures. OT gives new HEP for post op including AROM and stretches at wrist and hand in all planes (as below). She tolerates well, not overly painful.   Exercises - Standing Shoulder Flexion Full Range  - 3-4 x daily - 1-2 sets - 10-15 reps - Standing Elbow Flexion Extension AROM  - 4-6 x daily - 10-15 reps - Palm Up / Palm Down  - 3-4 x daily - 1-2 sets - 10-15 reps - Bend and Pull Back Wrist SLOWLY  - 4 x daily - 10-15 reps - "Windshield Wipers"   - 4 x daily - 10-15 reps - Wrist AROM Dart Throwers Motion  - 4 x daily - 10-15 reps - Forearm Supination Stretch  - 3-4 x daily - 2-3 reps - 15 sec hold - Touch Thumb to [facility] Finger  -  3-4 x daily - 1-2 sets - 10 reps - Tendon Glides  - 3-4 x daily - 3-5 reps - 2-3 seconds hold - BACK KNUCKLE STRETCHES   - 3-4 x daily - 2-3 reps - 15 hold - HOOK Stretch  - 3-4 x daily - 2-3 reps - 15-20 sec hold Patient Education - Scar Massage   PATIENT EDUCATION: Education details: See tx section above for details  Person educated: Patient Education method: Verbal Instruction, Teach back, Handouts  Education comprehension: States and demonstrates understanding, Additional Education required      HOME EXERCISE PROGRAM: Access Code: [email or web address] URL: [email or web address]    GOALS: Goals reviewed with patient? Yes     SHORT TERM GOALS: (STG required if POC>30 days)   Pt will obtain protective, custom orthotic. Target date: 09/08/21 Goal status: MET   2.  Pt will demo/state understanding of initial HEP to improve pain levels and prerequisite motion. Target date: 09/18/21 Goal status: 09/14/21: MET     LONG TERM GOALS:   Pt will improve functional ability by decreased impairment per Quick DASH assessment from 41% to 20% or better, for better quality of life. Target date: 12/11/21 Goal status: 10/21/21: progressing- 25% now   2.  Pt will improve grip strength in left hand to at least 40lbs for functional use at home and in IADLs. Target date: 12/11/21 Goal status: 10/21/21: progressing 36# now    3.  Pt will improve A/ROM in FA pronation from 56* to at least 70*, to have functional motion for tasks like reach and grasp.  Target date: 12/11/21 Goal status: 10/21/21: progressing- 64* now  4.  Pt will improve strength in left forearm to at least 4/5 MMT without pain to have increased functional ability to carry out selfcare and higher-level homecare tasks with no difficulty. Target date: 12/11/21 Goal status: 10/21/21: MET today but will need reassessed post 2nd surgery.    5.  Pt will decrease pain at rest from 5/10 to 1/10 or better to have better sleep and  occupational participation in daily roles. Target date: 12/11/21 Goal status: 10/21/21: MET but will need reassessed after 2nd surgery     ASSESSMENT:   CLINICAL IMPRESSION: 11/17/21: She is improving very well, still has some pain/soreness, will continue to work through [employer]. She states being eager to go back to work.   11/05/21: She is doing very well post op, decent wrist motion to begin, difficulty with radial deviation, however.     PLAN: OT FREQUENCY: 2x/week (tapering as appropriate) (currently approved 12 visits, used 5 from [facility])    OT DURATION: additional 5 weeks (from 11/06/21 - 12/11/21)    PLANNED INTERVENTIONS: self care/ADL training, therapeutic exercise, therapeutic activity, neuromuscular re-education, manual therapy, scar mobilization, passive range of motion, splinting, electrical stimulation, ultrasound, fluidotherapy, compression bandaging, moist heat, cryotherapy, contrast bath, patient/family education, energy conservation, coping strategies training, and Re-evaluation   RECOMMENDED OTHER SERVICES: none now    CONSULTED AND AGREED WITH PLAN OF CARE: Patient   PLAN FOR NEXT SESSION:  Upgrade to 4+ weeks protocol    Benito Mccreedy, OTR/L, CHT 11/17/2021, 3:19 PM

## 2021-11-25 NOTE — Therapy (Incomplete)
OUTPATIENT OCCUPATIONAL THERAPY TREATMENT NOTE   Patient Name: April Stevens MRN: [id number] DOB:06/29/1955, 66 y.o., female Today's Date: 11/17/2021  PCP: Dr. Benay Spice [name] REFERRING PROVIDER: Dr. Sherilyn Cooter   END OF SESSION:   OT End of Session - 11/17/21 1434     Visit Number 7    Number of Visits 24    Date for OT Re-Evaluation 12/11/21    Authorization Type WC eval + 24 visits approved    Authorization - Number of Visits 24    OT Start Time 2500    OT Stop Time 1515    OT Time Calculation (min) 41 min    Activity Tolerance Patient tolerated treatment well;Patient limited by pain;Patient limited by fatigue    Behavior During Therapy [facility] for tasks assessed/performed               Past Medical History:  Diagnosis Date   Depression    Hypertension    [name]    Past Surgical History:  Procedure Laterality Date   APPENDECTOMY  09/26/2018   HYSTERECTOMY ABDOMINAL WITH SALPINGO-OOPHORECTOMY  2000   DUB   JOINT REPLACEMENT     LAPAROSCOPIC APPENDECTOMY N/A 09/26/2018   Procedure: APPENDECTOMY LAPAROSCOPIC;  Surgeon: Kinsinger, Arta Bruce, MD;  Location: [place];  Service: General;  Laterality: N/A;   OPEN REDUCTION INTERNAL FIXATION (ORIF) DISTAL RADIAL FRACTURE Left 08/17/2021   Procedure: OPEN REDUCTION INTERNAL FIXATION (ORIF) LEFT DISTAL RADIAL FRACTURE;  Surgeon: Sherilyn Cooter, MD;  Location: [place];  Service: Orthopedics;  Laterality: Left;   SHOULDER SURGERY Left    01-02-2018, has had R shoulder rotator cuff previously   Patient Active Problem List   Diagnosis Date Noted   Closed fracture of left distal radius 08/14/2021   Perforated diverticulum 02/08/2020   Pneumoperitoneum 02/08/2020   HLD (hyperlipidemia) 02/08/2020   Pure hypercholesterolemia 10/02/2019   Prediabetes 10/02/2019   Hypokalemia 03/29/2019   Acute appendicitis 09/26/2018   Obstructive sleep apnea treated with continuous positive airway pressure (CPAP)  08/01/2017   Inadequate sleep hygiene 02/22/2017   Excessive daytime sleepiness 02/22/2017   Sleep deprivation 02/22/2017   Snoring 02/22/2017   OSA (obstructive sleep apnea) 02/22/2017   HTN (hypertension) 02/23/2012   Anxiety and depression 02/23/2012    ONSET DATE: DOS 08/17/21, 10/29/21 (second surgery)    REFERRING DIAG: B70.488Q (ICD-10-CM) - Other closed intra-articular fracture of distal end of left radius, initial encounter  THERAPY DIAG:  Localized edema  Stiffness of left wrist, not elsewhere classified  Pain in left wrist  Muscle weakness (generalized)  Stiffness of left hand, not elsewhere classified  Rationale for Evaluation and Treatment Rehabilitation  PERTINENT HISTORY: Per MD: "Left distal radius fracture s/p ORIF with volar [plate] and dorsal bridge plate."   PRECAUTIONS: 4 weeks s/p DSBP removal. A/A/PROM 4-6 x day 25 reps with end-range long stretches, edema control, resting orthosis; @ 3 weeks start weaning and dorsal taping as needed; 4-6 weeks weighted stretches & dynamics; _0  weeks PRE; return to full use 8-10 weeks.    WEIGHT BEARING RESTRICTIONS Yes NWB in left arm now   SUBJECTIVE:  She states ***    PAIN:  Are you having pain? No - not at rest  Rating:0/10 at rest today, uncomfortable with tight fist.  Pain does go up to 5/10 when trying to open tight jar in past week.    OBJECTIVE: (All objective assessments below are from initial evaluation on: 09/08/21 unless otherwise specified.)  HAND DOMINANCE: Right    ADLs: Overall ADLs: 10/01/21: states managing I/ADLs much better now, still has mild problems on average     FUNCTIONAL OUTCOME MEASURES: 10/21/21:Quick DASH 25% impairment today   Eval: Quck DASH 41% impairment today    UPPER EXTREMITY ROM    11/05/21: she has relatively painless, but tight, limited wrist motion today, making loose full fist still, can oppose, etc.   10/21/21: Full fist, full opposition today Lt hand.    Eval: She can touch all finger tips to palm, but not tuck DIP Js to touch distal palmar crease (1cm gap now). Thumb is a bit stiff and has a gap to base of SF as well.  MCP Js are a bit tight from swelling and perhaps plate placement & MF MCP measured 67*, which is representative of the rest at this point.    Active ROM Left eval Left  09/14/21 Lt  10/21/21 Lt 11/05/21 Lt 11/17/21 Lt  11/26/21  Shoulder flexion ~150  ~160     Shoulder abduction ~150  ~160     Elbow flexion 150  WNL     Elbow extension 0  WNL     Wrist flexion X  x 10 24 ***  Wrist extension X  x 42 53 ***  Wrist ulnar deviation X  x 27 42   Wrist radial deviation X  x (-5) 0   Wrist pronation 56 74 64 71    Wrist supination 71 75 75 70    (Blank rows = not tested)   Active ROM Left eval Left 09/25/21 Lt  10/21/21  Thumb MCP (0-60)   52   Thumb IP (0-80)   56   Thumb Radial abd/add (0-55)      Thumb Palmar abd/add (0-45)      Thumb Opposition to Small Finger 2cm gap to base of SF, but can oppose to pad of SF  Can now touch to base of SF Full opposition to base SF  Index MCP (0-90)      Index PIP (0-100)      Index DIP (0-70)      Long MCP (0-90) 0-67*  0-72* 0-77*  (Blank rows = not tested)     UPPER EXTREMITY MMT:      MMT Left 09/25/21 Lt  10/21/21  Elbow flexion  4/5 tender in wrist 5/5  Elbow extension 4+/5  5/5  Wrist flexion     Wrist extension     Wrist ulnar deviation     Wrist radial deviation     Wrist pronation   5/5  Wrist supination   5/5  (Blank rows = not tested)   HAND FUNCTION: 10/21/21: 36# Lt grip   09/25/21: Right grip: 62#  Left grip: 19#    COORDINATION: 10/21/21: Box and Blocks Test: 59 Blocks today (61 is WFL)  09/14/21: Box and Blocks Test: 42 Blocks today (61 is WFL)   SENSATION: 10/21/21: states only mildly numb by sx area on 2nd MC now.   Eval: She can feel LT in thumb and pressure, but states Lt thumb is also numb feeling volar and dorsally, also diminished around  scar areas mildly    EDEMA:           11/05/21: increased swelling post op in FA and wrist now.    10/21/21: none significant now (20.2cm circumferential around MCP Js)  OBSERVATIONS:          11/05/21: sutures clean, in place, no signs  of infection, etc.     10/21/21: fully healed, moving great, function improved.  Only mild- moderate deficits rated for function now.      TODAY'S TREATMENT:  11/26/21: ***Upgrade to 4+ weeks protocol. Weighted stretches as needed    11/17/21: She does new AROM for measures which are all much better now than 7 days post-op.  Radial deviation is the tightest margin and so OT tries light Grade 2-3 joint mobs for scaphoid and also teaches thumb stretches, hoping that distracting CMC J will cause less pinching/binding on radial side of wrist with motion. We review the rest of her HEP as well, and OT reminds not to lift more than 5# now, use caution, no heavy lifting or grip training yet.    PATIENT EDUCATION: Education details: See tx section above for details  Person educated: Patient Education method: Verbal Instruction, Teach back, Handouts  Education comprehension: States and demonstrates understanding, Additional Education required      HOME EXERCISE PROGRAM: Access Code: [email or web address] URL: [email or web address]    GOALS: Goals reviewed with patient? Yes     SHORT TERM GOALS: (STG required if POC>30 days)   Pt will obtain protective, custom orthotic. Target date: 09/08/21 Goal status: MET   2.  Pt will demo/state understanding of initial HEP to improve pain levels and prerequisite motion. Target date: 09/18/21 Goal status: 09/14/21: MET     LONG TERM GOALS:   Pt will improve functional ability by decreased impairment per Quick DASH assessment from 41% to 20% or better, for better quality of life. Target date: 12/11/21 Goal status: 10/21/21: progressing- 25% now   2.  Pt will improve grip strength in left hand to at least 40lbs for  functional use at home and in IADLs. Target date: 12/11/21 Goal status: 10/21/21: progressing 36# now    3.  Pt will improve A/ROM in FA pronation from 56* to at least 70*, to have functional motion for tasks like reach and grasp.  Target date: 12/11/21 Goal status: 10/21/21: progressing- 64* now    4.  Pt will improve strength in left forearm to at least 4/5 MMT without pain to have increased functional ability to carry out selfcare and higher-level homecare tasks with no difficulty. Target date: 12/11/21 Goal status: 10/21/21: MET today but will need reassessed post 2nd surgery.    5.  Pt will decrease pain at rest from 5/10 to 1/10 or better to have better sleep and occupational participation in daily roles. Target date: 12/11/21 Goal status: 10/21/21: MET but will need reassessed after 2nd surgery     ASSESSMENT:   CLINICAL IMPRESSION: 11/26/21: ***  11/17/21: She is improving very well, still has some pain/soreness, will continue to work through [employer]. She states being eager to go back to work.   11/05/21: She is doing very well post op, decent wrist motion to begin, difficulty with radial deviation, however.     PLAN: OT FREQUENCY: 2x/week (tapering as appropriate) (currently approved 12 visits, used 5 from [facility])    OT DURATION: additional 5 weeks (from 11/06/21 - 12/11/21)    PLANNED INTERVENTIONS: self care/ADL training, therapeutic exercise, therapeutic activity, neuromuscular re-education, manual therapy, scar mobilization, passive range of motion, splinting, electrical stimulation, ultrasound, fluidotherapy, compression bandaging, moist heat, cryotherapy, contrast bath, patient/family education, energy conservation, coping strategies training, and Re-evaluation   RECOMMENDED OTHER SERVICES: none now    CONSULTED AND AGREED WITH PLAN OF CARE: Patient   PLAN FOR NEXT SESSION:  ***  Benito Mccreedy, OTR/L, CHT 11/17/2021, 3:19 PM

## 2021-11-26 ENCOUNTER — Encounter: Payer: Worker's Compensation | Admitting: Rehabilitative and Restorative Service Providers"

## 2021-11-30 NOTE — Therapy (Signed)
OUTPATIENT OCCUPATIONAL THERAPY TREATMENT NOTE   Patient Name: April Stevens MRN: [id number] DOB:Aug 11, 1955, 66 y.o., female Today's Date: 12/01/2021  PCP: Dr. Benay Spice Jacelyn Stevens REFERRING PROVIDER: Dr. Sherilyn Stevens    END OF SESSION:   OT End of Session - 12/01/21 1529     Visit Number 8    Number of Visits 24    Date for OT Re-Evaluation 12/11/21    Authorization Type WC eval + 24 visits approved    Authorization - Number of Visits 24    OT Start Time 3403    OT Stop Time 1611    OT Time Calculation (min) 40 min    Equipment Utilized During Treatment wrist strap    Activity Tolerance Patient tolerated treatment well;Patient limited by pain;Patient limited by fatigue;No increased pain    Behavior During Therapy WFL for tasks assessed/performed               Past Medical History:  Diagnosis Date   Depression    Hypertension    [name]    Past Surgical History:  Procedure Laterality Date   APPENDECTOMY  09/26/2018   HYSTERECTOMY ABDOMINAL WITH SALPINGO-OOPHORECTOMY  2000   DUB   JOINT REPLACEMENT     LAPAROSCOPIC APPENDECTOMY N/A 09/26/2018   Procedure: APPENDECTOMY LAPAROSCOPIC;  Surgeon: Kinsinger, Arta Bruce, MD;  Location: [place];  Service: General;  Laterality: N/A;   OPEN REDUCTION INTERNAL FIXATION (ORIF) DISTAL RADIAL FRACTURE Left 08/17/2021   Procedure: OPEN REDUCTION INTERNAL FIXATION (ORIF) LEFT DISTAL RADIAL FRACTURE;  Surgeon: April Cooter, MD;  Location: [place];  Service: Orthopedics;  Laterality: Left;   SHOULDER SURGERY Left    01-02-2018, has had R shoulder rotator cuff previously   Patient Active Problem List   Diagnosis Date Noted   Closed fracture of left distal radius 08/14/2021   Perforated diverticulum 02/08/2020   Pneumoperitoneum 02/08/2020   HLD (hyperlipidemia) 02/08/2020   Pure hypercholesterolemia 10/02/2019   Prediabetes 10/02/2019   Hypokalemia 03/29/2019   Acute appendicitis 09/26/2018    Obstructive sleep apnea treated with continuous positive airway pressure (CPAP) 08/01/2017   Inadequate sleep hygiene 02/22/2017   Excessive daytime sleepiness 02/22/2017   Sleep deprivation 02/22/2017   Snoring 02/22/2017   OSA (obstructive sleep apnea) 02/22/2017   HTN (hypertension) 02/23/2012   Anxiety and depression 02/23/2012    ONSET DATE: DOS 08/17/21, 10/29/21 (second surgery)    REFERRING DIAG: T24.818H (ICD-10-CM) - Other closed intra-articular fracture of distal end of left radius, initial encounter  THERAPY DIAG:  Localized edema  Stiffness of left wrist, not elsewhere classified  Stiffness of left hand, not elsewhere classified  Muscle weakness (generalized)  Rationale for Evaluation and Treatment Rehabilitation  PERTINENT HISTORY: Per MD: "Left distal radius fracture s/p ORIF with volar [plate] and dorsal bridge plate."   PRECAUTIONS: 5 weeks s/p DSBP removal. A/A/PROM 4-6 x day 25 reps with end-range long stretches, edema control, resting orthosis; @ 3 weeks start weaning and dorsal taping as needed; 4-6 weeks weighted stretches & dynamics; _0  weeks PRE; return to full use 8-10 weeks.    WEIGHT BEARING RESTRICTIONS Yes NWB in left arm now   SUBJECTIVE:  She states no pain, doing well. She states she will return to work light duty in ~2 weeks.  MD wishes her to continue therapy as needed.    PAIN:  Are you having pain?  No - not at rest  Rating:0/10 at rest today, uncomfortable with tight fist.  Pain does  go up to 5/10 when trying to open tight jar in past week.    OBJECTIVE: (All objective assessments below are from initial evaluation on: 09/08/21 unless otherwise specified.)   HAND DOMINANCE: Right    ADLs: Overall ADLs: 10/01/21: states managing I/ADLs much better now, still has mild problems on average     FUNCTIONAL OUTCOME MEASURES: 10/21/21:Quick DASH 25% impairment today   Eval: Quck DASH 41% impairment today    UPPER EXTREMITY ROM     11/05/21: she has relatively painless, but tight, limited wrist motion today, making loose full fist still, can oppose, etc.   10/21/21: Full fist, full opposition today Lt hand.   Eval: She can touch all finger tips to palm, but not tuck DIP Js to touch distal palmar crease (1cm gap now). Thumb is a bit stiff and has a gap to base of SF as well.  MCP Js are a bit tight from swelling and perhaps plate placement & MF MCP measured 67*, which is representative of the rest at this point.    Active ROM Left eval Left  09/14/21 Lt  10/21/21 Lt 11/05/21 Lt 11/17/21  Shoulder flexion ~150  ~160    Shoulder abduction ~150  ~160    Elbow flexion 150  WNL    Elbow extension 0  WNL    Wrist flexion X  x 10 24  Wrist extension X  x 42 53  Wrist ulnar deviation X  x 27 42  Wrist radial deviation X  x (-5) 0  Wrist pronation 56 74 64 71   Wrist supination 71 75 75 70   (Blank rows = not tested)   Active ROM Left eval Left 09/25/21 Lt  10/21/21  Thumb MCP (0-60)   52   Thumb IP (0-80)   56   Thumb Radial abd/add (0-55)      Thumb Palmar abd/add (0-45)      Thumb Opposition to Small Finger 2cm gap to base of SF, but can oppose to pad of SF  Can now touch to base of SF Full opposition to base SF  Index MCP (0-90)      Index PIP (0-100)      Index DIP (0-70)      Long MCP (0-90) 0-67*  0-72* 0-77*  (Blank rows = not tested)     UPPER EXTREMITY MMT:      MMT Left 09/25/21 Lt  10/21/21  Elbow flexion  4/5 tender in wrist 5/5  Elbow extension 4+/5  5/5  Wrist flexion     Wrist extension     Wrist ulnar deviation     Wrist radial deviation     Wrist pronation   5/5  Wrist supination   5/5  (Blank rows = not tested)   HAND FUNCTION: 10/21/21: 36# Lt grip   09/25/21: Right grip: 62#  Left grip: 19#    COORDINATION: 10/21/21: Box and Blocks Test: 59 Blocks today (61 is WFL)  09/14/21: Box and Blocks Test: 42 Blocks today (61 is WFL)   SENSATION: 10/21/21: states only mildly numb by sx  area on 2nd MC now.   Eval: She can feel LT in thumb and pressure, but states Lt thumb is also numb feeling volar and dorsally, also diminished around scar areas mildly    EDEMA:           11/05/21: increased swelling post op in FA and wrist now.    10/21/21: none significant now (20.2cm circumferential around MCP  Js)  OBSERVATIONS:          11/05/21: sutures clean, in place, no signs of infection, etc.     10/21/21: fully healed, moving great, function improved.  Only mild- moderate deficits rated for function now.      TODAY'S TREATMENT:  12/01/21:  OT clears her for light strength at bicep, tricep again and upgrades her to weighted wrist stretches today and light wrist strength with 1# hammer as tolerated in flex, ext, pronation, supination.  She also reviews other HEP as below. She tolerates all well today. She did get very mild pain when initially attempting hammer, but that quickly went away after she "choked up" on it. No soreness at end. OT also supplies her with compressive wrist strap, which she finds helps support her wrist during resistance.   Exercises - Bend and Pull Back Wrist SLOWLY  - 4 x daily - 10-15 reps - "Windshield Wipers"   - 4 x daily - 10-15 reps - Wrist AROM Dart Throwers Motion  - 4 x daily - 10-15 reps - Forearm Supination Stretch  - 3-4 x daily - 2-3 reps - 15 sec hold - Wrist Prayer Stretch  - 4 x daily - 3-5 reps - 15 sec hold - Wrist Flexion Stretch  - 4 x daily - 3-5 reps - 15 sec hold - BACK KNUCKLE STRETCHES   - 3-4 x daily - 2-3 reps - 15 hold - Hammer Stretch or Strength   - 2-4 x daily - 1-2 sets - 10-15 reps - Standing Bicep Curls with Resistance  - 2-4 x daily - 1-2 sets - 10-15 reps - Standing Elbow Extension with Self-Anchored Resistance  - 2-3 x daily - 10-15 reps - Standing Row with Anchored Resistance  - 2-3 x daily - 1 sets - 10-15 reps - Full Fist  - 2-3 x daily - 5 reps - "Duck Mouth" Strength  - 2-3 x daily - 5 reps - Finger Extension  "Pizza!"   - 2-3 x daily - 5 reps - Thumb Opposition with Putty  - 2-3 x daily - 5 reps    PATIENT EDUCATION: Education details: See tx section above for details  Person educated: Patient Education method: Verbal Instruction, Teach back, Handouts  Education comprehension: States and demonstrates understanding, Additional Education required      HOME EXERCISE PROGRAM: Access Code: [email or web address] URL: [email or web address]    GOALS: Goals reviewed with patient? Yes     SHORT TERM GOALS: (STG required if POC>30 days)   Pt will obtain protective, custom orthotic. Target date: 09/08/21 Goal status: MET   2.  Pt will demo/state understanding of initial HEP to improve pain levels and prerequisite motion. Target date: 09/18/21 Goal status: 09/14/21: MET     LONG TERM GOALS:   Pt will improve functional ability by decreased impairment per Quick DASH assessment from 41% to 20% or better, for better quality of life. Target date: 12/11/21 Goal status: 10/21/21: progressing- 25% now   2.  Pt will improve grip strength in left hand to at least 40lbs for functional use at home and in IADLs. Target date: 12/11/21 Goal status: 10/21/21: progressing 36# now    3.  Pt will improve A/ROM in FA pronation from 56* to at least 70*, to have functional motion for tasks like reach and grasp.  Target date: 12/11/21 Goal status: 10/21/21: progressing- 64* now    4.  Pt will improve strength in left forearm to at least 4/5 MMT without  pain to have increased functional ability to carry out selfcare and higher-level homecare tasks with no difficulty. Target date: 12/11/21 Goal status: 10/21/21: MET today but will need reassessed post 2nd surgery.    5.  Pt will decrease pain at rest from 5/10 to 1/10 or better to have better sleep and occupational participation in daily roles. Target date: 12/11/21 Goal status: 10/21/21: MET but will need reassessed after 2nd surgery     ASSESSMENT:    CLINICAL IMPRESSION: 12/01/21: She is doing very well and tolerating light strengthening now even slightly ahead of normal postop protocols.  Doctor told her not to lift more than 10 pounds.  And we are only using 1 to 2 pounds for now.  She will need a reassessment next visit per therapist's plan of care but she does already have additional approval for therapy from [employer]  If she meets all goals and feels stronger she could possibly discharge to self-management or we may continue on and to work hardening types of activities and therapy as needed.    PLAN: OT FREQUENCY: 2x/week (tapering as appropriate) (currently approved 12 visits, used 5 from [facility])    OT DURATION: additional 5 weeks (from 11/06/21 - 12/11/21)    PLANNED INTERVENTIONS: self care/ADL training, therapeutic exercise, therapeutic activity, neuromuscular re-education, manual therapy, scar mobilization, passive range of motion, splinting, electrical stimulation, ultrasound, fluidotherapy, compression bandaging, moist heat, cryotherapy, contrast bath, patient/family education, energy conservation, coping strategies training, and Re-evaluation   RECOMMENDED OTHER SERVICES: none now    CONSULTED AND AGREED WITH PLAN OF CARE: Patient   PLAN FOR NEXT SESSION:  Reassess check goals determine need for additional therapy or work hardening type activities.  Add concentric wrist strengthening as well   Benito Mccreedy, OTR/L, CHT 12/01/2021, 4:53 PM

## 2021-12-01 ENCOUNTER — Encounter: Payer: Self-pay | Admitting: Rehabilitative and Restorative Service Providers"

## 2021-12-01 ENCOUNTER — Ambulatory Visit (INDEPENDENT_AMBULATORY_CARE_PROVIDER_SITE_OTHER): Payer: Worker's Compensation | Admitting: Rehabilitative and Restorative Service Providers"

## 2021-12-01 DIAGNOSIS — R600 Localized edema: Secondary | ICD-10-CM

## 2021-12-01 DIAGNOSIS — M6281 Muscle weakness (generalized): Secondary | ICD-10-CM | POA: Diagnosis not present

## 2021-12-01 DIAGNOSIS — M25632 Stiffness of left wrist, not elsewhere classified: Secondary | ICD-10-CM | POA: Diagnosis not present

## 2021-12-01 DIAGNOSIS — M25642 Stiffness of left hand, not elsewhere classified: Secondary | ICD-10-CM | POA: Diagnosis not present

## 2021-12-08 ENCOUNTER — Ambulatory Visit (INDEPENDENT_AMBULATORY_CARE_PROVIDER_SITE_OTHER): Payer: Worker's Compensation | Admitting: Rehabilitative and Restorative Service Providers"

## 2021-12-08 ENCOUNTER — Encounter: Payer: Self-pay | Admitting: Rehabilitative and Restorative Service Providers"

## 2021-12-08 DIAGNOSIS — M25532 Pain in left wrist: Secondary | ICD-10-CM

## 2021-12-08 DIAGNOSIS — M25642 Stiffness of left hand, not elsewhere classified: Secondary | ICD-10-CM | POA: Diagnosis not present

## 2021-12-08 DIAGNOSIS — M6281 Muscle weakness (generalized): Secondary | ICD-10-CM

## 2021-12-08 DIAGNOSIS — M25632 Stiffness of left wrist, not elsewhere classified: Secondary | ICD-10-CM

## 2021-12-08 DIAGNOSIS — R600 Localized edema: Secondary | ICD-10-CM | POA: Diagnosis not present

## 2021-12-08 NOTE — Therapy (Signed)
OUTPATIENT OCCUPATIONAL THERAPY TREATMENT & PROGRESS NOTE   Patient Name: April Stevens MRN: [id number] DOB:06-29-1955, 66 y.o., female Today's Date: 12/08/2021  PCP: Dr. Benay Spice [name] REFERRING PROVIDER: Dr. Sherilyn Cooter    END OF SESSION:   OT End of Session - 12/08/21 1427     Visit Number 9    Number of Visits 24    Date for OT Re-Evaluation 01/08/22    Authorization Type WC eval + 24 visits approved    Authorization - Number of Visits 24    OT Start Time 0347    OT Stop Time 1519    OT Time Calculation (min) 50 min    Activity Tolerance Patient tolerated treatment well;Patient limited by pain;Patient limited by fatigue;No increased pain    Behavior During Therapy WFL for tasks assessed/performed             Past Medical History:  Diagnosis Date   Depression    Hypertension    [name]    Past Surgical History:  Procedure Laterality Date   APPENDECTOMY  09/26/2018   HYSTERECTOMY ABDOMINAL WITH SALPINGO-OOPHORECTOMY  2000   DUB   JOINT REPLACEMENT     LAPAROSCOPIC APPENDECTOMY N/A 09/26/2018   Procedure: APPENDECTOMY LAPAROSCOPIC;  Surgeon: Kinsinger, Arta Bruce, MD;  Location: [place];  Service: General;  Laterality: N/A;   OPEN REDUCTION INTERNAL FIXATION (ORIF) DISTAL RADIAL FRACTURE Left 08/17/2021   Procedure: OPEN REDUCTION INTERNAL FIXATION (ORIF) LEFT DISTAL RADIAL FRACTURE;  Surgeon: Sherilyn Cooter, MD;  Location: [place];  Service: Orthopedics;  Laterality: Left;   SHOULDER SURGERY Left    01-02-2018, has had R shoulder rotator cuff previously   Patient Active Problem List   Diagnosis Date Noted   Closed fracture of left distal radius 08/14/2021   Perforated diverticulum 02/08/2020   Pneumoperitoneum 02/08/2020   HLD (hyperlipidemia) 02/08/2020   Pure hypercholesterolemia 10/02/2019   Prediabetes 10/02/2019   Hypokalemia 03/29/2019   Acute appendicitis 09/26/2018   Obstructive sleep apnea treated with continuous  positive airway pressure (CPAP) 08/01/2017   Inadequate sleep hygiene 02/22/2017   Excessive daytime sleepiness 02/22/2017   Sleep deprivation 02/22/2017   Snoring 02/22/2017   OSA (obstructive sleep apnea) 02/22/2017   HTN (hypertension) 02/23/2012   Anxiety and depression 02/23/2012    ONSET DATE: DOS 08/17/21, 10/29/21 (second surgery)    REFERRING DIAG: Q25.956L (ICD-10-CM) - Other closed intra-articular fracture of distal end of left radius, initial encounter  THERAPY DIAG:  Localized edema  Stiffness of left hand, not elsewhere classified  Pain in left wrist  Muscle weakness (generalized)  Stiffness of left wrist, not elsewhere classified  Rationale for Evaluation and Treatment Rehabilitation  PERTINENT HISTORY: Per MD: "Left distal radius fracture s/p ORIF with volar [plate] and dorsal bridge plate."   PRECAUTIONS: 6 weeks s/p DSBP removal. A/A/PROM 4-6 x day 25 reps with end-range long stretches, edema control, resting orthosis; @ 3 weeks start weaning and dorsal taping as needed; 4-6 weeks weighted stretches & dynamics; _0  weeks PRE; return to full use 8-10 weeks.    WEIGHT BEARING RESTRICTIONS Yes NWB in left arm now   SUBJECTIVE:  She states pushing herself up out of bed yesterday, hurting her Lt wrist. She's in some pain now. She also states that she probably won't be returning to work now and that she's hired a Chief Executive Officer.    PAIN:  Are you having pain?  Yes- in dorsum of Lt wrist and somewhat in Lt thumb base  as well.  Rating:5-6/10 at rest today, uncomfortable with tight fist.     OBJECTIVE: (All objective assessments below are from initial evaluation on: 09/08/21 unless otherwise specified.)   HAND DOMINANCE: Right    ADLs: Overall ADLs: 10/01/21: states managing I/ADLs much better now, still has mild problems on average    FUNCTIONAL OUTCOME MEASURES: 12/08/21: Quick DASH 45% impairment today   10/21/21:Quick DASH 25% impairment today   Eval: Quck DASH  41% impairment today    UPPER EXTREMITY ROM    11/05/21: she has relatively painless, but tight, limited wrist motion today, making loose full fist still, can oppose, etc.   10/21/21: Full fist, full opposition today Lt hand.    Active ROM Left eval Left  09/14/21 Lt  10/21/21 Lt 11/05/21 Lt 11/17/21 Lt 12/08/21  Shoulder flexion ~150  ~160   WNL / no c/o  Shoulder abduction ~150  ~160   WNL / no c/o  Elbow flexion 150  WNL   WNL / no c/o  Elbow extension 0  WNL   WNL / no c/o  Wrist flexion X  x _0 Wrist extension X  x 42 53 58  Wrist ulnar deviation X  x 27 42 37  Wrist radial deviation X  x (-5) 0 (-7)  Wrist pronation 56 74 64 71  78  Wrist supination 71 75 75 70  73  (Blank rows = not tested)   Active ROM Left eval Left 09/25/21 Lt  10/21/21 Lt  12/08/21  She can make loose/full fist with some pain today.  Thumb MCP (0-60)   52  45  Thumb IP (0-80)   56  48  Thumb Palmar abd/add     7cm span  Thumb Opposition to Small Finger 2cm gap to base of SF, but can oppose to pad of SF  Can now touch to base of SF Full opposition to base SF Can still oppose to base of SF with tightness  (Blank rows = not tested)     UPPER EXTREMITY MMT:      MMT Left 09/25/21 Lt  10/21/21 Lt  12/08/21  Elbow flexion  4/5 tender in wrist 5/5 Strength deferred today, as she is painful in hand/wrist from recent exacerbation  Elbow extension 4+/5  5/5   Wrist flexion      Wrist extension      Wrist ulnar deviation      Wrist radial deviation      Wrist pronation   5/5   Wrist supination   5/5   (Blank rows = not tested)   HAND FUNCTION: 12/08/21: gripping deferred today due to tenderness.   10/21/21: 36# Lt grip   09/25/21: Right grip: 62#  Left grip: 19#    COORDINATION: 12/08/21: Box and Blocks Test: 51 Blocks today (61 is WFL)  10/21/21: Box and Blocks Test: 59 Blocks today (61 is WFL)  09/14/21: Box and Blocks Test: 42 Blocks today (61 is WFL)   SENSATION: 12/08/21: states  tender over dorsum of IF and still decreased sensation there, though LT is palpable still.   10/21/21: states only mildly numb by sx area on 2nd MC now.   Eval: She can feel LT in thumb and pressure, but states Lt thumb is also numb feeling volar and dorsally, also diminished around scar areas mildly    EDEMA:           12/08/21: 18.2cm circumferentially around proximal wrist crease and ulnar styloid.  (  First measurement at wrist today due to increased pain/swelling c/o)  Hand MCP Js largely unchanged since last visits.   11/05/21: increased swelling post op in FA and wrist now.    10/21/21: none significant now (20.2cm circumferential around MCP Js)  OBSERVATIONS:          12/08/21: She is tender about dorsum of Lt wrist today also base of thumb. Everything seems to have stability still, and it's likely she just sprained her wrist when pushing up out of bed, but she will return to MD for a f/u to check.   11/05/21: sutures clean, in place, no signs of infection, etc.     10/21/21: fully healed, moving great, function improved.  Only mild- moderate deficits rated for function now.      TODAY'S TREATMENT:  12/08/21: Pt performs AROM, gripping, and strength with Lt hand/wrist/FA against resistance for exercise/activities as well as new measures today. Using that data, OT also reviews home exercises and provides appropriate downgrades as below (due to unfortunate exacerbation of symptoms). OT also discusses home and functional tasks with the pt and reviews goals. Pt states understanding and tolerates all well.  OT tells her to not lift anything more than 1 or 2 pounds, not apply force to hand and wrist, no painful weightbearing.  She states understanding  Exercises - Bend and Pull Back Wrist SLOWLY  - 4 x daily - 10-15 reps - "Windshield Wipers"   - 4 x daily - 10-15 reps - Wrist AROM Dart Throwers Motion  - 4 x daily - 10-15 reps - Thumb Opposition  - 4-6 x daily - 10 reps - Tendon Glides  -  4-6 x daily - 3-5 reps - 2-3 seconds hold   PATIENT EDUCATION: Education details: See tx section above for details  Person educated: Patient Education method: Veterinary surgeon, Teach back, Handouts  Education comprehension: States and demonstrates understanding, Additional Education required      HOME EXERCISE PROGRAM: Access Code: [email or web address] URL: [email or web address]    GOALS: Goals reviewed with patient? Yes     SHORT TERM GOALS: (STG required if POC>30 days)   Pt will obtain protective, custom orthotic. Target date: 09/08/21 Goal status: MET   2.  Pt will demo/state understanding of initial HEP to improve pain levels and prerequisite motion. Target date: 09/18/21 Goal status: 09/14/21: MET     LONG TERM GOALS:   Pt will improve functional ability by decreased impairment per Quick DASH assessment from 41% to 20% or better, for better quality of life. Target date: 12/11/21 Goal status: 12/08/21: Not Met- She rates herself more impaired than ever at this point, 45%   2.  Pt will improve grip strength in left hand to at least 40lbs for functional use at home and in IADLs. Target date: 12/11/21 Goal status: 12/08/21: Not Met -she had been doing well but this was not tolerated today    3.  Pt will improve A/ROM in FA pronation from 56* to at least 70*, to have functional motion for tasks like reach and grasp.  Target date: 12/11/21 Goal status: 12/08/21: Goal met today   4.  Pt will improve strength in left forearm to at least 4/5 MMT without pain to have increased functional ability to carry out selfcare and higher-level homecare tasks with no difficulty. Target date: 12/11/21 Goal status: 12/08/21: Not met-this had been improving but due to increasing pain this was not tolerated today   5.  Pt will decrease pain at rest  from 5/10 to 1/10 or better to have better sleep and occupational participation in daily roles. Target date: 12/11/21 Goal status:  12/08/21: Not met-this was previously managed however due to recent exacerbation she now rates pain worse     ASSESSMENT:   CLINICAL IMPRESSION: 12/08/21: It is very unfortunate that she hurt her wrist yesterday getting out of bed, especially because she was advancing very well, had no pain, had full hand motion and was tolerating all stretches and light strengthening very well when last seen.  She had been consistently advancing ahead of protocols and doing very well after her first surgery and second surgeries.  Due to this setback (which seems to be more of a strain or a sprain than anything significantly impactful), OT has downgraded her exercises to more tolerable range of motion.  Once she is less tender and painful we will resume back into stretching weightbearing and almost work conditioning types of activities.  All as tolerated.  She is in agreement that she will require more therapy at this point.   12/01/21: She is doing very well and tolerating light strengthening now even slightly ahead of normal postop protocols.  Doctor told her not to lift more than 10 pounds.  And we are only using 1 to 2 pounds for now.  She will need a reassessment next visit per therapist's plan of care but she does already have additional approval for therapy from [employer].  If she meets all goals and feels stronger she could possibly discharge to self-management or we may continue on and to work hardening types of activities and therapy as needed.    PLAN: OT FREQUENCY: 2x/week (tapering as appropriate)    OT DURATION: additional 4 weeks (from 12/08/21 - 01/08/22)    PLANNED INTERVENTIONS: self care/ADL training, therapeutic exercise, therapeutic activity, neuromuscular re-education, manual therapy, scar mobilization, passive range of motion, splinting, electrical stimulation, ultrasound, fluidotherapy, compression bandaging, moist heat, cryotherapy, contrast bath, patient/family education, energy  conservation, coping strategies training, and Re-evaluation   RECOMMENDED OTHER SERVICES: none now    CONSULTED AND AGREED WITH PLAN OF CARE: Patient   PLAN FOR NEXT SESSION:  Do range of motion check, check pain and tenderness, check recent MD follow-up notes.  Try to resume light stretching and light resistance as tolerated.   Benito Mccreedy, OTR/L, CHT 12/08/2021, 5:15 PM

## 2021-12-14 NOTE — Therapy (Incomplete)
OUTPATIENT OCCUPATIONAL THERAPY TREATMENT & PROGRESS NOTE   Patient Name: April Stevens MRN: [id number] DOB:[birth year], 66 y.o., female Today's Date: 12/14/2021  PCP: Dr. Benay Spice Jacelyn Stevens REFERRING PROVIDER: Dr. Sherilyn Stevens    END OF SESSION:     Past Medical History:  Diagnosis Date   Depression    Hypertension    [name]    Past Surgical History:  Procedure Laterality Date   APPENDECTOMY  09/26/2018   HYSTERECTOMY ABDOMINAL WITH SALPINGO-OOPHORECTOMY  2000   DUB   JOINT REPLACEMENT     LAPAROSCOPIC APPENDECTOMY N/A 09/26/2018   Procedure: APPENDECTOMY LAPAROSCOPIC;  Surgeon: April Skinner, MD;  Location: [place];  Service: General;  Laterality: N/A;   OPEN REDUCTION INTERNAL FIXATION (ORIF) DISTAL RADIAL FRACTURE Left 08/17/2021   Procedure: OPEN REDUCTION INTERNAL FIXATION (ORIF) LEFT DISTAL RADIAL FRACTURE;  Surgeon: April Cooter, MD;  Location: [place];  Service: Orthopedics;  Laterality: Left;   SHOULDER SURGERY Left    01-02-2018, has had R shoulder rotator cuff previously   Patient Active Problem List   Diagnosis Date Noted   Closed fracture of left distal radius 08/14/2021   Perforated diverticulum 02/08/2020   Pneumoperitoneum 02/08/2020   HLD (hyperlipidemia) 02/08/2020   Pure hypercholesterolemia 10/02/2019   Prediabetes 10/02/2019   Hypokalemia 03/29/2019   Acute appendicitis 09/26/2018   Obstructive sleep apnea treated with continuous positive airway pressure (CPAP) 08/01/2017   Inadequate sleep hygiene 02/22/2017   Excessive daytime sleepiness 02/22/2017   Sleep deprivation 02/22/2017   Snoring 02/22/2017   OSA (obstructive sleep apnea) 02/22/2017   HTN (hypertension) 02/23/2012   Anxiety and depression 02/23/2012    ONSET DATE: DOS 08/17/21, 10/29/21 (second surgery)    REFERRING DIAG: B26.203T (ICD-10-CM) - Other closed intra-articular fracture of distal end of left radius, initial encounter  THERAPY DIAG:   No diagnosis found.  Rationale for Evaluation and Treatment Rehabilitation  PERTINENT HISTORY: Per MD: "Left distal radius fracture s/p ORIF with volar [plate] and dorsal bridge plate."   PRECAUTIONS: ~7 weeks s/p DSBP removal. A/A/PROM 4-6 x day 25 reps with end-range long stretches, edema control, resting orthosis; @ 3 weeks start weaning and dorsal taping as needed; 4-6 weeks weighted stretches & dynamics; _0  weeks PRE; return to full use 8-10 weeks.    WEIGHT BEARING RESTRICTIONS No -WBAT now    SUBJECTIVE:  She states ***   pushing herself up out of bed yesterday, hurting her Lt wrist. She's in some pain now. She also states that she probably won't be returning to work now and that she's hired a Chief Executive Officer.    PAIN:  Are you having pain?  *** Yes- in dorsum of Lt wrist and somewhat in Lt thumb base as well.  Rating:5-6/10 at rest today, uncomfortable with tight fist.     OBJECTIVE: (All objective assessments below are from initial evaluation on: 09/08/21 unless otherwise specified.)   HAND DOMINANCE: Right    ADLs: Overall ADLs: 10/01/21: states managing I/ADLs much better now, still has mild problems on average    FUNCTIONAL OUTCOME MEASURES: 12/08/21: Quick DASH 45% impairment today   10/21/21:Quick DASH 25% impairment today   Eval: Quck DASH 41% impairment today    UPPER EXTREMITY ROM    11/05/21: she has relatively painless, but tight, limited wrist motion today, making loose full fist still, can oppose, etc.   10/21/21: Full fist, full opposition today Lt hand.    Active ROM Left eval Left  09/14/21 Lt  10/21/21  Lt 11/05/21 Lt 11/17/21 Lt 12/08/21  Shoulder flexion ~150  ~160   WNL / no c/o  Shoulder abduction ~150  ~160   WNL / no c/o  Elbow flexion 150  WNL   WNL / no c/o  Elbow extension 0  WNL   WNL / no c/o  Wrist flexion X  x _0 Wrist extension X  x 42 53 58  Wrist ulnar deviation X  x 27 42 37  Wrist radial deviation X  x (-5) 0 (-7)  Wrist  pronation 56 74 64 71  78  Wrist supination 71 75 75 70  73  (Blank rows = not tested)   Active ROM Left eval Left 09/25/21 Lt  10/21/21 Lt  12/08/21  She can make loose/full fist with some pain today.  Thumb MCP (0-60)   52  45  Thumb IP (0-80)   56  48  Thumb Palmar abd/add     7cm span  Thumb Opposition to Small Finger 2cm gap to base of SF, but can oppose to pad of SF  Can now touch to base of SF Full opposition to base SF Can still oppose to base of SF with tightness  (Blank rows = not tested)     UPPER EXTREMITY MMT:      MMT Left 09/25/21 Lt  10/21/21 Lt  12/08/21  Elbow flexion  4/5 tender in wrist 5/5 Strength deferred today, as she is painful in hand/wrist from recent exacerbation  Elbow extension 4+/5  5/5   Wrist flexion      Wrist extension      Wrist ulnar deviation      Wrist radial deviation      Wrist pronation   5/5   Wrist supination   5/5   (Blank rows = not tested)   HAND FUNCTION: 12/08/21: gripping deferred today due to tenderness.   10/21/21: 36# Lt grip   09/25/21: Right grip: 62#  Left grip: 19#    COORDINATION: 12/08/21: Box and Blocks Test: 51 Blocks today (61 is WFL)  10/21/21: Box and Blocks Test: 59 Blocks today (61 is WFL)  09/14/21: Box and Blocks Test: 42 Blocks today (61 is WFL)   SENSATION: 12/08/21: states tender over dorsum of IF and still decreased sensation there, though LT is palpable still.   EDEMA:           12/08/21: 18.2cm circumferentially around proximal wrist crease and ulnar styloid.  (First measurement at wrist today due to increased pain/swelling c/o)  Hand MCP Js largely unchanged since last visits.    OBSERVATIONS:          12/08/21: She is tender about dorsum of Lt wrist today also base of thumb. Everything seems to have stability still, and it's likely she just sprained her wrist when pushing up out of bed, but she will return to MD for a f/u to check.    TODAY'S TREATMENT:  12/15/21: ***Do range of motion check,  check pain and tenderness, check recent MD follow-up notes.  Try to resume light stretching and light resistance as tolerated.   12/08/21: Pt performs AROM, gripping, and strength with Lt hand/wrist/FA against resistance for exercise/activities as well as new measures today. Using that data, OT also reviews home exercises and provides appropriate downgrades as below (due to unfortunate exacerbation of symptoms). OT also discusses home and functional tasks with the pt and reviews goals. Pt states understanding and tolerates all well.  OT tells  her to not lift anything more than 1 or 2 pounds, not apply force to hand and wrist, no painful weightbearing.  She states understanding  Exercises - Bend and Pull Back Wrist SLOWLY  - 4 x daily - 10-15 reps - "Windshield Wipers"   - 4 x daily - 10-15 reps - Wrist AROM Dart Throwers Motion  - 4 x daily - 10-15 reps - Thumb Opposition  - 4-6 x daily - 10 reps - Tendon Glides  - 4-6 x daily - 3-5 reps - 2-3 seconds hold   PATIENT EDUCATION: Education details: See tx section above for details  Person educated: Patient Education method: Veterinary surgeon, Teach back, Handouts  Education comprehension: States and demonstrates understanding, Additional Education required      HOME EXERCISE PROGRAM: Access Code: [email or web address] URL: [email or web address]    GOALS: Goals reviewed with patient? Yes     SHORT TERM GOALS: (STG required if POC>30 days)   Pt will obtain protective, custom orthotic. Target date: 09/08/21 Goal status: MET   2.  Pt will demo/state understanding of initial HEP to improve pain levels and prerequisite motion. Target date: 09/18/21 Goal status: 09/14/21: MET     LONG TERM GOALS:   Pt will improve functional ability by decreased impairment per Quick DASH assessment from 41% to 20% or better, for better quality of life. Target date: 12/11/21 Goal status: 12/08/21: Not Met- She rates herself more impaired than ever at  this point, 45%   2.  Pt will improve grip strength in left hand to at least 40lbs for functional use at home and in IADLs. Target date: 12/11/21 Goal status: 12/08/21: Not Met -she had been doing well but this was not tolerated today    3.  Pt will improve A/ROM in FA pronation from 56* to at least 70*, to have functional motion for tasks like reach and grasp.  Target date: 12/11/21 Goal status: 12/08/21: Goal met today   4.  Pt will improve strength in left forearm to at least 4/5 MMT without pain to have increased functional ability to carry out selfcare and higher-level homecare tasks with no difficulty. Target date: 12/11/21 Goal status: 12/08/21: Not met-this had been improving but due to increasing pain this was not tolerated today   5.  Pt will decrease pain at rest from 5/10 to 1/10 or better to have better sleep and occupational participation in daily roles. Target date: 12/11/21 Goal status: 12/08/21: Not met-this was previously managed however due to recent exacerbation she now rates pain worse     ASSESSMENT:   CLINICAL IMPRESSION: 12/15/21: ***  12/08/21: It is very unfortunate that she hurt her wrist yesterday getting out of bed, especially because she was advancing very well, had no pain, had full hand motion and was tolerating all stretches and light strengthening very well when last seen.  She had been consistently advancing ahead of protocols and doing very well after her first surgery and second surgeries.  Due to this setback (which seems to be more of a strain or a sprain than anything significantly impactful), OT has downgraded her exercises to more tolerable range of motion.  Once she is less tender and painful we will resume back into stretching weightbearing and almost work conditioning types of activities.  All as tolerated.  She is in agreement that she will require more therapy at this point.   12/01/21: She is doing very well and tolerating light  strengthening now even slightly ahead of normal postop  protocols.  Doctor told her not to lift more than 10 pounds.  And we are only using 1 to 2 pounds for now.  She will need a reassessment next visit per therapist's plan of care but she does already have additional approval for therapy from [employer]  If she meets all goals and feels stronger she could possibly discharge to self-management or we may continue on and to work hardening types of activities and therapy as needed.    PLAN: OT FREQUENCY: 2x/week (tapering as appropriate)    OT DURATION: additional 4 weeks (from 12/08/21 - 01/08/22)    PLANNED INTERVENTIONS: self care/ADL training, therapeutic exercise, therapeutic activity, neuromuscular re-education, manual therapy, scar mobilization, passive range of motion, splinting, electrical stimulation, ultrasound, fluidotherapy, compression bandaging, moist heat, cryotherapy, contrast bath, patient/family education, energy conservation, coping strategies training, and Re-evaluation   RECOMMENDED OTHER SERVICES: none now    CONSULTED AND AGREED WITH PLAN OF CARE: Patient   PLAN FOR NEXT SESSION:  *** Do range of motion check, check pain and tenderness, check recent MD follow-up notes.  Try to resume light stretching and light resistance as tolerated.   Benito Mccreedy, OTR/L, CHT 12/14/2021, 12:06 PM

## 2021-12-15 ENCOUNTER — Encounter: Payer: Worker's Compensation | Admitting: Rehabilitative and Restorative Service Providers"

## 2021-12-21 NOTE — Therapy (Signed)
OUTPATIENT OCCUPATIONAL THERAPY TREATMENT NOTE   Patient Name: April Stevens MRN: [id number] DOB:1955-04-08, 66 y.o., female Today's Date: 12/22/2021  PCP: Dr. Benay Spice [name] REFERRING PROVIDER: Dr. Sherilyn Cooter    END OF SESSION:   OT End of Session - 12/22/21 1607     Visit Number 10    Number of Visits 24    Date for OT Re-Evaluation 01/08/22    Authorization Type WC eval + 24 visits approved    Authorization - Number of Visits 24    OT Start Time 8088    OT Stop Time 1651    OT Time Calculation (min) 48 min    Activity Tolerance Patient tolerated treatment well;Patient limited by pain;Patient limited by fatigue;No increased pain    Behavior During Therapy WFL for tasks assessed/performed              Past Medical History:  Diagnosis Date   Depression    Hypertension    [name]    Past Surgical History:  Procedure Laterality Date   APPENDECTOMY  09/26/2018   HYSTERECTOMY ABDOMINAL WITH SALPINGO-OOPHORECTOMY  2000   DUB   JOINT REPLACEMENT     LAPAROSCOPIC APPENDECTOMY N/A 09/26/2018   Procedure: APPENDECTOMY LAPAROSCOPIC;  Surgeon: Kinsinger, Arta Bruce, MD;  Location: [place];  Service: General;  Laterality: N/A;   OPEN REDUCTION INTERNAL FIXATION (ORIF) DISTAL RADIAL FRACTURE Left 08/17/2021   Procedure: OPEN REDUCTION INTERNAL FIXATION (ORIF) LEFT DISTAL RADIAL FRACTURE;  Surgeon: Sherilyn Cooter, MD;  Location: [place];  Service: Orthopedics;  Laterality: Left;   SHOULDER SURGERY Left    01-02-2018, has had R shoulder rotator cuff previously   Patient Active Problem List   Diagnosis Date Noted   Closed fracture of left distal radius 08/14/2021   Perforated diverticulum 02/08/2020   Pneumoperitoneum 02/08/2020   HLD (hyperlipidemia) 02/08/2020   Pure hypercholesterolemia 10/02/2019   Prediabetes 10/02/2019   Hypokalemia 03/29/2019   Acute appendicitis 09/26/2018   Obstructive sleep apnea treated with continuous positive  airway pressure (CPAP) 08/01/2017   Inadequate sleep hygiene 02/22/2017   Excessive daytime sleepiness 02/22/2017   Sleep deprivation 02/22/2017   Snoring 02/22/2017   OSA (obstructive sleep apnea) 02/22/2017   HTN (hypertension) 02/23/2012   Anxiety and depression 02/23/2012    ONSET DATE: DOS 08/17/21, 10/29/21 (second surgery)    REFERRING DIAG: P10.315X (ICD-10-CM) - Other closed intra-articular fracture of distal end of left radius, initial encounter  THERAPY DIAG:  Localized edema  Stiffness of left hand, not elsewhere classified  Stiffness of left wrist, not elsewhere classified  Muscle weakness (generalized)  Rationale for Evaluation and Treatment Rehabilitation  PERTINENT HISTORY: Per MD: "Left distal radius fracture s/p ORIF with volar [plate] and dorsal bridge plate."   PRECAUTIONS: 7+ weeks s/p DSBP removal. A/A/PROM 4-6 x day 25 reps with end-range long stretches, edema control, resting orthosis; @ 3 weeks start weaning and dorsal taping as needed; 4-6 weeks weighted stretches & dynamics; _0  weeks PRE; return to full use 8-10 weeks.    WEIGHT BEARING RESTRICTIONS No -WBAT now    SUBJECTIVE:  She states feeling less tender than last week, is wearing work uniform and has been working the past 4 days, doing light duty. She states it is somewhat painful to do these tasks, and she even dropped some things when working due to pain.    PAIN:  Are you having pain?   Yes- in dorsum of Lt wrist and somewhat in Lt thumb  base as well, feeling sore/tired after work  Rating: 4-5/10 at rest today, uncomfortable with tight fist.     OBJECTIVE: (All objective assessments below are from initial evaluation on: 09/08/21 unless otherwise specified.)   HAND DOMINANCE: Right    ADLs: Overall ADLs: 10/01/21: states managing I/ADLs much better now, still has mild problems on average    FUNCTIONAL OUTCOME MEASURES: 12/08/21: Quick DASH 45% impairment today   10/21/21:Quick DASH 25%  impairment today   Eval: Quck DASH 41% impairment today    UPPER EXTREMITY ROM    11/05/21: she has relatively painless, but tight, limited wrist motion today, making loose full fist still, can oppose, etc.   10/21/21: Full fist, full opposition today Lt hand.    Active ROM Left eval Left  09/14/21 Lt  10/21/21 Lt 11/05/21 Lt 11/17/21 Lt 12/08/21 Lt 12/22/21  Shoulder flexion ~150  ~160   WNL / no c/o   Shoulder abduction ~150  ~160   WNL / no c/o   Elbow flexion 150  WNL   WNL / no c/o   Elbow extension 0  WNL   WNL / no c/o   Wrist flexion X  x _0 (-4)  Wrist extension X  x 42 53 58 49  Wrist ulnar deviation X  x 27 42 37   Wrist radial deviation X  x (-5) 0 (-7)   Wrist pronation 56 74 64 71  78   Wrist supination 71 75 75 70  73   (Blank rows = not tested)   Active ROM Left eval Left 09/25/21 Lt  10/21/21 Lt  12/08/21  She can make loose/full fist with some pain today.  Thumb MCP (0-60)   52  45  Thumb IP (0-80)   56  48  Thumb Palmar abd/add     7cm span  Thumb Opposition to Small Finger 2cm gap to base of SF, but can oppose to pad of SF  Can now touch to base of SF Full opposition to base SF Can still oppose to base of SF with tightness  (Blank rows = not tested)     UPPER EXTREMITY MMT:      MMT Left 09/25/21 Lt  10/21/21 Lt  12/08/21  Elbow flexion  4/5 tender in wrist 5/5 Strength deferred today, as she is painful in hand/wrist from recent exacerbation  Elbow extension 4+/5  5/5   Wrist flexion      Wrist extension      Wrist ulnar deviation      Wrist radial deviation      Wrist pronation   5/5   Wrist supination   5/5   (Blank rows = not tested)   HAND FUNCTION: 12/08/21: gripping deferred today due to tenderness   10/21/21: 36# Lt grip   09/25/21: Right grip: 62#  Left grip: 19#    COORDINATION: 12/08/21: Box and Blocks Test: 51 Blocks today (61 is WFL)  10/21/21: Box and Blocks Test: 59 Blocks today (61 is WFL)  09/14/21: Box and Blocks Test:  42 Blocks today (61 is WFL)   SENSATION: 12/08/21: states tender over dorsum of IF and still decreased sensation there, though LT is palpable still.   EDEMA:           12/08/21: 18.2cm circumferentially around proximal wrist crease and ulnar styloid.  (First measurement at wrist today due to increased pain/swelling c/o)  Hand MCP Js largely unchanged since last visits.    OBSERVATIONS:  12/22/21: She's swollen and red at ulnar head (this is worst this session) as well as dorsal distal radius by Lister's Tubercle (this was present last visit)    12/08/21: She is tender about dorsum of Lt wrist today also base of thumb. Everything seems to have stability still, and it's likely she just sprained her wrist when pushing up out of bed, but she will return to MD for a f/u to check.     TODAY'S TREATMENT:  12/22/21: She starts with AROM for ROM check, which shows even tighter/more tender today after work. OT starts her with 5 mins MH with edu to add light stretches as tolerated back into HEP.  After work, she was suggested to use 3-5 mins heat, do 3 light stretches (3-5x each), then ice down 5-10 mins for pain as needed.  If stretches are truly too painful, she is to do light AROM as instructed last session, instead. OT then does light, tolerable manual therapy IASTM and stretches to Lt wrist and hand to help with pain symptoms and PROM. She doesn't tolerate very much stretch at all today. Afterwards, OT puts her on ice 5 mins to lower pain and swelling, while discussing how to modify stretches on off days vs work days. She states understanding, but seems tired and somewhat exasperated.   Exercises - Bend and Pull Back Wrist SLOWLY  - 4 x daily - 10-15 reps - "Windshield Wipers"   - 4 x daily - 10-15 reps - Wrist AROM Dart Throwers Motion  - 4 x daily - 10-15 reps - Thumb Opposition  - 4-6 x daily - 10 reps - Tendon Glides  - 4-6 x daily - 3-5 reps - 2-3 seconds hold - Seated Wrist Radial  Deviation Stretch  - 3-5 x daily - 3-5 reps - 15 hold - Wrist Flexion Stretch  - 4 x daily - 3-5 reps - 15 sec hold - Wrist Extension Stretch Pronated  - 4 x daily - 3-5 reps - 15 hold   PATIENT EDUCATION: Education details: See tx section above for details  Person educated: Patient Education method: Verbal Instruction, Teach back, Handouts  Education comprehension: States and demonstrates understanding, Additional Education required      HOME EXERCISE PROGRAM: Access Code: [email or web address] URL: [email or web address]    GOALS: Goals reviewed with patient? Yes     SHORT TERM GOALS: (STG required if POC>30 days)   Pt will obtain protective, custom orthotic. Target date: 09/08/21 Goal status: MET   2.  Pt will demo/state understanding of initial HEP to improve pain levels and prerequisite motion. Target date: 09/18/21 Goal status: 09/14/21: MET     LONG TERM GOALS:   Pt will improve functional ability by decreased impairment per Quick DASH assessment from 41% to 20% or better, for better quality of life. Target date: 12/11/21 Goal status: 12/08/21: Not Met- She rates herself more impaired than ever at this point, 45%   2.  Pt will improve grip strength in left hand to at least 40lbs for functional use at home and in IADLs. Target date: 12/11/21 Goal status: 12/08/21: Not Met -she had been doing well but this was not tolerated today    3.  Pt will improve A/ROM in FA pronation from 56* to at least 70*, to have functional motion for tasks like reach and grasp.  Target date: 12/11/21 Goal status: 12/08/21: Goal met today   4.  Pt will improve strength in left forearm to at least 4/5 MMT without  pain to have increased functional ability to carry out selfcare and higher-level homecare tasks with no difficulty. Target date: 12/11/21 Goal status: 12/08/21: Not met-this had been improving but due to increasing pain this was not tolerated today   5.  Pt will decrease pain at  rest from 5/10 to 1/10 or better to have better sleep and occupational participation in daily roles. Target date: 12/11/21 Goal status: 12/08/21: Not met-this was previously managed however due to recent exacerbation she now rates pain worse     ASSESSMENT:   CLINICAL IMPRESSION: 12/22/21: She is now working light duty and is somewhat more inflamed, stiff, tender today.  She was encouraged to wear hard orthotic at work, if allowed and safe to do with her job duties.     PLAN: OT FREQUENCY: 2x/week (tapering as appropriate)    OT DURATION: additional 4 weeks (from 12/08/21 - 01/08/22)    PLANNED INTERVENTIONS: self care/ADL training, therapeutic exercise, therapeutic activity, neuromuscular re-education, manual therapy, scar mobilization, passive range of motion, splinting, electrical stimulation, ultrasound, fluidotherapy, compression bandaging, moist heat, cryotherapy, contrast bath, patient/family education, energy conservation, coping strategies training, and Re-evaluation   RECOMMENDED OTHER SERVICES: none now    CONSULTED AND AGREED WITH PLAN OF CARE: Patient   PLAN FOR NEXT SESSION:  Check status, review AROM and stretches again, try to decrease stiffness, tenderness, eventually try to resume light strength.   Benito Mccreedy, OTR/L, CHT 12/22/2021, 5:02 PM

## 2021-12-22 ENCOUNTER — Encounter: Payer: Medicare PPO | Admitting: Rehabilitative and Restorative Service Providers"

## 2021-12-22 ENCOUNTER — Ambulatory Visit (INDEPENDENT_AMBULATORY_CARE_PROVIDER_SITE_OTHER): Payer: Worker's Compensation | Admitting: Rehabilitative and Restorative Service Providers"

## 2021-12-22 ENCOUNTER — Encounter: Payer: Self-pay | Admitting: Rehabilitative and Restorative Service Providers"

## 2021-12-22 DIAGNOSIS — R600 Localized edema: Secondary | ICD-10-CM | POA: Diagnosis not present

## 2021-12-22 DIAGNOSIS — M25632 Stiffness of left wrist, not elsewhere classified: Secondary | ICD-10-CM

## 2021-12-22 DIAGNOSIS — M25642 Stiffness of left hand, not elsewhere classified: Secondary | ICD-10-CM | POA: Diagnosis not present

## 2021-12-22 DIAGNOSIS — M6281 Muscle weakness (generalized): Secondary | ICD-10-CM | POA: Diagnosis not present

## 2021-12-28 NOTE — Therapy (Incomplete)
OUTPATIENT OCCUPATIONAL THERAPY TREATMENT NOTE   Patient Name: April Stevens MRN: [id number] DOB:1955/04/29, 66 y.o., female Today's Date: 12/28/2021  PCP: Dr. Benay Spice [name] REFERRING PROVIDER: Dr. Sherilyn Cooter    END OF SESSION:      Past Medical History:  Diagnosis Date   Depression    Hypertension    [name]    Past Surgical History:  Procedure Laterality Date   APPENDECTOMY  09/26/2018   HYSTERECTOMY ABDOMINAL WITH SALPINGO-OOPHORECTOMY  2000   DUB   JOINT REPLACEMENT     LAPAROSCOPIC APPENDECTOMY N/A 09/26/2018   Procedure: APPENDECTOMY LAPAROSCOPIC;  Surgeon: Mickeal Skinner, MD;  Location: [place];  Service: General;  Laterality: N/A;   OPEN REDUCTION INTERNAL FIXATION (ORIF) DISTAL RADIAL FRACTURE Left 08/17/2021   Procedure: OPEN REDUCTION INTERNAL FIXATION (ORIF) LEFT DISTAL RADIAL FRACTURE;  Surgeon: Sherilyn Cooter, MD;  Location: [place];  Service: Orthopedics;  Laterality: Left;   SHOULDER SURGERY Left    01-02-2018, has had R shoulder rotator cuff previously   Patient Active Problem List   Diagnosis Date Noted   Closed fracture of left distal radius 08/14/2021   Perforated diverticulum 02/08/2020   Pneumoperitoneum 02/08/2020   HLD (hyperlipidemia) 02/08/2020   Pure hypercholesterolemia 10/02/2019   Prediabetes 10/02/2019   Hypokalemia 03/29/2019   Acute appendicitis 09/26/2018   Obstructive sleep apnea treated with continuous positive airway pressure (CPAP) 08/01/2017   Inadequate sleep hygiene 02/22/2017   Excessive daytime sleepiness 02/22/2017   Sleep deprivation 02/22/2017   Snoring 02/22/2017   OSA (obstructive sleep apnea) 02/22/2017   HTN (hypertension) 02/23/2012   Anxiety and depression 02/23/2012    ONSET DATE: DOS 08/17/21, 10/29/21 (second surgery)    REFERRING DIAG: H54.562B (ICD-10-CM) - Other closed intra-articular fracture of distal end of left radius, initial encounter  THERAPY DIAG:  No  diagnosis found.  Rationale for Evaluation and Treatment Rehabilitation  PERTINENT HISTORY: Per MD: "Left distal radius fracture s/p ORIF with volar [plate] and dorsal bridge plate."   PRECAUTIONS: 8+ weeks s/p DSBP removal. A/A/PROM 4-6 x day 25 reps with end-range long stretches, edema control, resting orthosis; @ 3 weeks start weaning and dorsal taping as needed; 4-6 weeks weighted stretches & dynamics; _0  weeks PRE; return to full use 8-10 weeks.    WEIGHT BEARING RESTRICTIONS No -WBAT now    SUBJECTIVE:  She states ***  feeling less tender than last week, is wearing work uniform and has been working the past 4 days, doing light duty. She states it is somewhat painful to do these tasks, and she even dropped some things when working due to pain.    PAIN:  Are you having pain?  *** Yes- in dorsum of Lt wrist and somewhat in Lt thumb base as well, feeling sore/tired after work  Rating: 4-5/10 at rest today, uncomfortable with tight fist.     OBJECTIVE: (All objective assessments below are from initial evaluation on: 09/08/21 unless otherwise specified.)   HAND DOMINANCE: Right    ADLs: Overall ADLs: 10/01/21: states managing I/ADLs much better now, still has mild problems on average    FUNCTIONAL OUTCOME MEASURES: 12/08/21: Quick DASH 45% impairment today   10/21/21:Quick DASH 25% impairment today   Eval: Quck DASH 41% impairment today    UPPER EXTREMITY ROM    11/05/21: she has relatively painless, but tight, limited wrist motion today, making loose full fist still, can oppose, etc.   10/21/21: Full fist, full opposition today Lt hand.  Active ROM Left eval Left  09/14/21 Lt  10/21/21 Lt 11/05/21 Lt 11/17/21 Lt 12/08/21 Lt 12/22/21  Shoulder flexion ~150  ~160   WNL / no c/o   Shoulder abduction ~150  ~160   WNL / no c/o   Elbow flexion 150  WNL   WNL / no c/o   Elbow extension 0  WNL   WNL / no c/o   Wrist flexion X  x _0 (-4)  Wrist extension X  x 42 53 58  49  Wrist ulnar deviation X  x 27 42 37   Wrist radial deviation X  x (-5) 0 (-7)   Wrist pronation 56 74 64 71  78   Wrist supination 71 75 75 70  73   (Blank rows = not tested)   Active ROM Left eval Left 09/25/21 Lt  10/21/21 Lt  12/08/21  She can make loose/full fist with some pain today.  Thumb MCP (0-60)   52  45  Thumb IP (0-80)   56  48  Thumb Palmar abd/add     7cm span  Thumb Opposition to Small Finger 2cm gap to base of SF, but can oppose to pad of SF  Can now touch to base of SF Full opposition to base SF Can still oppose to base of SF with tightness  (Blank rows = not tested)     UPPER EXTREMITY MMT:      MMT Left 09/25/21 Lt  10/21/21 Lt  12/08/21  Elbow flexion  4/5 tender in wrist 5/5 Strength deferred today, as she is painful in hand/wrist from recent exacerbation  Elbow extension 4+/5  5/5   Wrist flexion      Wrist extension      Wrist ulnar deviation      Wrist radial deviation      Wrist pronation   5/5   Wrist supination   5/5   (Blank rows = not tested)   HAND FUNCTION: 12/08/21: gripping deferred today due to tenderness   10/21/21: 36# Lt grip   09/25/21: Right grip: 62#  Left grip: 19#    COORDINATION: 12/08/21: Box and Blocks Test: 51 Blocks today (61 is WFL)  10/21/21: Box and Blocks Test: 59 Blocks today (61 is WFL)  09/14/21: Box and Blocks Test: 42 Blocks today (61 is WFL)   SENSATION: 12/08/21: states tender over dorsum of IF and still decreased sensation there, though LT is palpable still.   EDEMA:           12/08/21: 18.2cm circumferentially around proximal wrist crease and ulnar styloid.  (First measurement at wrist today due to increased pain/swelling c/o)  Hand MCP Js largely unchanged since last visits.    OBSERVATIONS:          12/22/21: She's swollen and red at ulnar head (this is worst this session) as well as dorsal distal radius by Lister's Tubercle (this was present last visit)    12/08/21: She is tender about dorsum of Lt  wrist today also base of thumb. Everything seems to have stability still, and it's likely she just sprained her wrist when pushing up out of bed, but she will return to MD for a f/u to check.     TODAY'S TREATMENT:  01/01/22: ***Check status, review AROM and stretches again, try to decrease stiffness, tenderness, eventually try to resume light strength.    12/22/21: She starts with AROM for ROM check, which shows even tighter/more tender today after work.  OT starts her with 5 mins MH with edu to add light stretches as tolerated back into HEP.  After work, she was suggested to use 3-5 mins heat, do 3 light stretches (3-5x each), then ice down 5-10 mins for pain as needed.  If stretches are truly too painful, she is to do light AROM as instructed last session, instead. OT then does light, tolerable manual therapy IASTM and stretches to Lt wrist and hand to help with pain symptoms and PROM. She doesn't tolerate very much stretch at all today. Afterwards, OT puts her on ice 5 mins to lower pain and swelling, while discussing how to modify stretches on off days vs work days. She states understanding, but seems tired and somewhat exasperated.   Exercises - Bend and Pull Back Wrist SLOWLY  - 4 x daily - 10-15 reps - "Windshield Wipers"   - 4 x daily - 10-15 reps - Wrist AROM Dart Throwers Motion  - 4 x daily - 10-15 reps - Thumb Opposition  - 4-6 x daily - 10 reps - Tendon Glides  - 4-6 x daily - 3-5 reps - 2-3 seconds hold - Seated Wrist Radial Deviation Stretch  - 3-5 x daily - 3-5 reps - 15 hold - Wrist Flexion Stretch  - 4 x daily - 3-5 reps - 15 sec hold - Wrist Extension Stretch Pronated  - 4 x daily - 3-5 reps - 15 hold   PATIENT EDUCATION: Education details: See tx section above for details  Person educated: Patient Education method: Verbal Instruction, Teach back, Handouts  Education comprehension: States and demonstrates understanding, Additional Education required      HOME EXERCISE  PROGRAM: Access Code: [email or web address] URL: [email or web address]    GOALS: Goals reviewed with patient? Yes     SHORT TERM GOALS: (STG required if POC>30 days)   Pt will obtain protective, custom orthotic. Target date: 09/08/21 Goal status: MET   2.  Pt will demo/state understanding of initial HEP to improve pain levels and prerequisite motion. Target date: 09/18/21 Goal status: 09/14/21: MET     LONG TERM GOALS:   Pt will improve functional ability by decreased impairment per Quick DASH assessment from 41% to 20% or better, for better quality of life. Target date: 01/08/22 Goal status: 12/08/21: Not Met- She rates herself more impaired than ever at this point, 45%   2.  Pt will improve grip strength in left hand to at least 40lbs for functional use at home and in IADLs. Target date: 01/08/22 Goal status: 12/08/21: Not Met -she had been doing well but this was not tolerated today    3.  Pt will improve A/ROM in FA pronation from 56* to at least 70*, to have functional motion for tasks like reach and grasp.  Target date: 12/11/21 Goal status: 12/08/21: Goal met today   4.  Pt will improve strength in left forearm to at least 4/5 MMT without pain to have increased functional ability to carry out selfcare and higher-level homecare tasks with no difficulty. Target date: 01/08/22 Goal status: 12/08/21: Not met-this had been improving but due to increasing pain this was not tolerated today   5.  Pt will decrease pain at rest from 5/10 to 1/10 or better to have better sleep and occupational participation in daily roles. Target date: 01/08/22 Goal status: 12/08/21: Not met-this was previously managed however due to recent exacerbation she now rates pain worse     ASSESSMENT:   CLINICAL IMPRESSION: 01/01/22: ***  12/22/21:  She is now working light duty and is somewhat more inflamed, stiff, tender today.  She was encouraged to wear hard orthotic at work, if allowed and safe  to do with her job duties.     PLAN: OT FREQUENCY: 2x/week (tapering as appropriate)    OT DURATION: additional 4 weeks (from 12/08/21 - 01/08/22)    PLANNED INTERVENTIONS: self care/ADL training, therapeutic exercise, therapeutic activity, neuromuscular re-education, manual therapy, scar mobilization, passive range of motion, splinting, electrical stimulation, ultrasound, fluidotherapy, compression bandaging, moist heat, cryotherapy, contrast bath, patient/family education, energy conservation, coping strategies training, and Re-evaluation   RECOMMENDED OTHER SERVICES: none now    CONSULTED AND AGREED WITH PLAN OF CARE: Patient   PLAN FOR NEXT SESSION:  ***  Benito Mccreedy, OTR/L, CHT 12/28/2021, 1:14 PM

## 2022-01-01 ENCOUNTER — Encounter: Payer: Worker's Compensation | Admitting: Rehabilitative and Restorative Service Providers"

## 2022-01-05 NOTE — Therapy (Addendum)
OUTPATIENT OCCUPATIONAL THERAPY TREATMENT & DISCHARGE NOTE   Patient Name: April Stevens MRN: [id number] DOB:Mar 11, 1955, 66 y.o., female Today's Date: 01/08/2022  PCP: Dr. Hiram Comber REFERRING PROVIDER: Dr. Sherilyn Cooter   Progress Note  Reporting Period 12/08/21 to 01/08/22 (she's only been see 2x this period, due to attendance issues).   See note below for Objective Data and Assessment of Progress/Goals.    END OF SESSION:   OT End of Session - 01/08/22 1007     Visit Number 11    Number of Visits 24    Date for OT Re-Evaluation 02/05/22    Authorization Type WC eval + 24 visits approved    Authorization - Number of Visits 24    OT Start Time [id number]    OT Stop Time 1054    OT Time Calculation (min) 46 min    Activity Tolerance Patient tolerated treatment well;Patient limited by pain;Patient limited by fatigue;No increased pain    Behavior During Therapy WFL for tasks assessed/performed             Past Medical History:  Diagnosis Date   Depression    Hypertension    [name]    Past Surgical History:  Procedure Laterality Date   APPENDECTOMY  09/26/2018   HYSTERECTOMY ABDOMINAL WITH SALPINGO-OOPHORECTOMY  2000   DUB   JOINT REPLACEMENT     LAPAROSCOPIC APPENDECTOMY N/A 09/26/2018   Procedure: APPENDECTOMY LAPAROSCOPIC;  Surgeon: Kinsinger, Arta Bruce, MD;  Location: [place];  Service: General;  Laterality: N/A;   OPEN REDUCTION INTERNAL FIXATION (ORIF) DISTAL RADIAL FRACTURE Left 08/17/2021   Procedure: OPEN REDUCTION INTERNAL FIXATION (ORIF) LEFT DISTAL RADIAL FRACTURE;  Surgeon: Sherilyn Cooter, MD;  Location: [place];  Service: Orthopedics;  Laterality: Left;   SHOULDER SURGERY Left    01-02-2018, has had R shoulder rotator cuff previously   Patient Active Problem List   Diagnosis Date Noted   Closed fracture of left distal radius 08/14/2021   Perforated diverticulum 02/08/2020   Pneumoperitoneum 02/08/2020   HLD  (hyperlipidemia) 02/08/2020   Pure hypercholesterolemia 10/02/2019   Prediabetes 10/02/2019   Hypokalemia 03/29/2019   Acute appendicitis 09/26/2018   Obstructive sleep apnea treated with continuous positive airway pressure (CPAP) 08/01/2017   Inadequate sleep hygiene 02/22/2017   Excessive daytime sleepiness 02/22/2017   Sleep deprivation 02/22/2017   Snoring 02/22/2017   OSA (obstructive sleep apnea) 02/22/2017   HTN (hypertension) 02/23/2012   Anxiety and depression 02/23/2012    ONSET DATE: DOS 08/17/21, 10/29/21 (second surgery)    REFERRING DIAG: [id number] (ICD-10-CM) - Other closed intra-articular fracture of distal end of left radius, initial encounter  THERAPY DIAG:  Stiffness of left hand, not elsewhere classified  Localized edema  Stiffness of left wrist, not elsewhere classified  Muscle weakness (generalized)  Pain in left wrist  Rationale for Evaluation and Treatment Rehabilitation  PERTINENT HISTORY: Per MD: "Left distal radius fracture s/p ORIF with volar [plate] and dorsal bridge plate."   PRECAUTIONS: 10 weeks s/p DSBP removal. A/A/PROM 4-6 x day 25 reps with end-range long stretches, edema control, resting orthosis; @ 3 weeks start weaning and dorsal taping as needed; 4-6 weeks weighted stretches & dynamics; @6$  weeks PRE; return to full use 8-10 weeks.    WEIGHT BEARING RESTRICTIONS No -WBAT now    SUBJECTIVE:  She missed a week of therapy, returns today. She states she was "taken out of work" about a week ago by the doctor due to increased pain,  swelling.  She has repeat x-ray that looked ok but she states he is concerned about acute swelling, so she will also have a CT scan.  She also complains of Lt MF "locking" and "popping" at times in the morning (describes stenosing tenosynovitis).    PAIN:  Are you having pain?  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  Rating: 0/10 at rest today, uncomfortable with tight fist.     OBJECTIVE:  (All objective assessments below are from initial evaluation on: 09/08/21 unless otherwise specified.)   HAND DOMINANCE: Right    ADLs: Overall ADLs: 01/08/22: she has worsening problems at home, and cannot work at all per self-reports     FUNCTIONAL OUTCOME MEASURES: 12/08/21: Quick DASH 45% impairment today   10/21/21:Quick DASH 25% impairment today   Eval: Quck DASH 41% impairment today    UPPER EXTREMITY ROM    11/05/21: she has relatively painless, but tight, limited wrist motion today, making loose full fist still, can oppose, etc.   10/21/21: Full fist, full opposition today Lt hand.    Active ROM Left eval Left  09/14/21 Lt  10/21/21 Lt 11/05/21 Lt 11/17/21 Lt 12/08/21 Lt 12/22/21 Lt 01/08/22  Shoulder flexion ~150  ~160   WNL / no c/o    Shoulder abduction ~150  ~160   WNL / no c/o    Elbow flexion 150  WNL   WNL / no c/o    Elbow extension 0  WNL   WNL / no c/o    Wrist flexion X  x _0 (-4) (-5*) (PROM = 20* and tender)   Wrist extension X  x 42 53 58 49 52* (PROM = 52* and tender)   Wrist ulnar deviation X  x 27 42 37    Wrist radial deviation X  x (-5) 0 (-7)    Wrist pronation 56 74 64 71  78    Wrist supination 71 75 75 70  73    (Blank rows = not tested)   Active ROM Left eval Left 09/25/21 Lt  10/21/21 Lt  12/08/21  She can make loose/full fist with some pain today.  Thumb MCP (0-60)   52  45  Thumb IP (0-80)   56  48  Thumb Palmar abd/add     7cm span  Thumb Opposition to Small Finger 2cm gap to base of SF, but can oppose to pad of SF  Can now touch to base of SF Full opposition to base SF Can still oppose to base of SF with tightness  (Blank rows = not tested)     UPPER EXTREMITY MMT:      MMT Left 09/25/21 Lt  10/21/21 Lt  12/08/21  Elbow flexion  4/5 tender in wrist 5/5 Strength deferred today, as she is painful in hand/wrist from recent exacerbation  Elbow extension 4+/5  5/5   Wrist flexion      Wrist extension      Wrist ulnar deviation       Wrist radial deviation      Wrist pronation   5/5   Wrist supination   5/5   (Blank rows = not tested)   HAND FUNCTION: 12/08/21: gripping deferred today due to tenderness   10/21/21: 36# Lt grip   09/25/21: Right grip: 62#  Left grip: 19#    COORDINATION: 12/08/21: Box and Blocks Test: 51 Blocks today (61 is WFL)  10/21/21: Box and Blocks Test: 59 Blocks  today (61 is WFL)  09/14/21: Box and Blocks Test: 42 Blocks today (61 is WFL)   SENSATION: 12/08/21: states tender over dorsum of IF and still decreased sensation there, though LT is palpable still.   EDEMA:           12/08/21: 18.2cm circumferentially around proximal wrist crease and ulnar styloid.  (First measurement at wrist today due to increased pain/swelling c/o)  Hand MCP Js largely unchanged since last visits.    OBSERVATIONS:          01/08/22: She appears to have tender volar A1 pulley in Lt MF, also palpable tendon knot, pain and locking at middle finger- presents like acute "trigger finger" or stenosing tenosynovitis.  Her dorsum of wrist is more swollen than prior, tender to palpation, still unable to move well or without pain (like she was before and immediately after second surgery).   TODAY'S TREATMENT:  01/08/22: Due to continued, acute swelling (new since 1-2 weeks post second surgery), pain and decreased tolerance to motion and resistance, OT continues to advise decreasing/downgrading HEP to allow for more rest/healing.  She was recommended to not bear any weight, not do grip training, not stretch painfully, only do exercises 2-3 x day as tolerated, light motion and very light stretch/PROM.  She should wear brace at all other times, except hygiene.  She states understanding that.   She is also given band-aide, shown how to wear around MF PIP J to prevent triggering finger, and educated no repetitive gripping and rest there to help tenosynovitis heal.  She states understanding. Demo's back very light stretches with  no added pain today (as below).   Exercises - Bend and Pull Back Wrist SLOWLY  - 2-3 x daily - 10 reps - "Windshield Wipers"   - 2-3 x daily - 10 reps - Wrist AROM Dart Throwers Motion  - 2-3 x daily - 10 reps - Thumb Opposition  - 2-3 x daily - 10 reps - Tendon Glides  - 2-3 x daily - 3-5 reps - 2-3 seconds hold - Seated Wrist Radial Deviation Stretch  - 2-3 x daily - 3-5 reps - 15 hold - Wrist Flexion Stretch  - 2-3 x daily - 3-5 reps - 15 sec hold - Wrist Extension Stretch Pronated  - 2-3 x daily - 3-5 reps - 15 hold  PATIENT EDUCATION: Education details: See tx section above for details  Person educated: Patient Education method: Verbal Instruction, Teach back, Handouts  Education comprehension: States and demonstrates understanding, Additional Education required      HOME EXERCISE PROGRAM: Access Code: [email or web address] URL: [email or web address]    GOALS: Goals reviewed with patient? Yes     SHORT TERM GOALS: (STG required if POC>30 days)   Pt will obtain protective, custom orthotic. Target date: 09/08/21 Goal status: MET   2.  Pt will demo/state understanding of initial HEP to improve pain levels and prerequisite motion. Target date: 09/18/21 Goal status: 09/14/21: MET     LONG TERM GOALS:   Pt will improve functional ability by decreased impairment per Quick DASH assessment from 41% to 20% or better, for better quality of life. Target date: 02/05/22 Goal status: 01/08/22: NOT MET 12/08/21: Not Met- She rates herself more impaired than ever at this point, 45%   2.  Pt will improve grip strength in left hand to at least 40lbs for functional use at home and in IADLs. Target date: 02/05/22 Goal status: 01/08/22: NOT MET- not tolerating 12/08/21: Not Met -she had  been doing well but this was not tolerated today    3.  Pt will improve A/ROM in FA pronation from 56* to at least 70*, to have functional motion for tasks like reach and grasp.  Target date:  12/11/21 Goal status: 12/08/21: Goal met today   4.  Pt will improve strength in left forearm to at least 4/5 MMT without pain to have increased functional ability to carry out selfcare and higher-level homecare tasks with no difficulty. Target date: 02/05/22 Goal status: 01/08/22: NOT MET- not tolerating 12/08/21: Not met-this had been improving but due to increasing pain this was not tolerated today   5.  Pt will decrease pain at rest from 5/10 to 1/10 or better to have better sleep and occupational participation in daily roles. Target date: 02/05/22 Goal status: 01/08/22: NOT MET- no pain at the moment, but she has been in significant pain recently due to exacerbation ~3 weeks ago.      ASSESSMENT:   CLINICAL IMPRESSION: 01/08/22: She was doing very well immediately after DBP removal, better motion, full fist, strengthening hand, and then after 11/17/21 visit, she stated hurting her wrist at home, pushing herself out of bed.  After that time, she has had worsening swelling, redness, tenderness, lack of full fist, and now emerging tenosynovitis issues, etc. She has been progressively "worse" in past 3 weeks, especially after she attempted to return to work.  CT will be helpful to rule out internal problems that may be causing this exacerbation.  OT is forced to downgrade therapy, and start back to "square one," to a certain degree. If no improvement with increased rest in a few more weeks, it could be sign of damage that is beyond the outpatient therapy treatment ability, and she will be sent back to her doctor for other avenues of treatment.     PLAN: OT FREQUENCY: 1x/week    OT DURATION: additional 4 weeks (from 01/08/22- 02/05/22 as needed)    PLANNED INTERVENTIONS: self care/ADL training, therapeutic exercise, therapeutic activity, neuromuscular re-education, manual therapy, scar mobilization, passive range of motion, splinting, electrical stimulation, ultrasound, fluidotherapy,  compression bandaging, moist heat, cryotherapy, contrast bath, patient/family education, energy conservation, coping strategies training, and Re-evaluation   RECOMMENDED OTHER SERVICES: none now    CONSULTED AND AGREED WITH PLAN OF CARE: Patient   PLAN FOR NEXT SESSION:  Check on tenderness, swelling, tolerance to motion and stretch, check tenosynovitis as well.   Benito Mccreedy, OTR/L, CHT 01/08/2022, 12:24 PM    OCCUPATIONAL THERAPY DISCHARGE SUMMARY  Visits from Start of Care: 11  Unfortunately he did not return to therapy and is discharged at this point. See notes for details.   Benito Mccreedy, OTR/L, CHT 03/16/22

## 2022-01-08 ENCOUNTER — Encounter: Payer: Self-pay | Admitting: Rehabilitative and Restorative Service Providers"

## 2022-01-08 ENCOUNTER — Ambulatory Visit (INDEPENDENT_AMBULATORY_CARE_PROVIDER_SITE_OTHER): Payer: Worker's Compensation | Admitting: Rehabilitative and Restorative Service Providers"

## 2022-01-08 DIAGNOSIS — M25532 Pain in left wrist: Secondary | ICD-10-CM

## 2022-01-08 DIAGNOSIS — M6281 Muscle weakness (generalized): Secondary | ICD-10-CM

## 2022-01-08 DIAGNOSIS — M25632 Stiffness of left wrist, not elsewhere classified: Secondary | ICD-10-CM

## 2022-01-08 DIAGNOSIS — R600 Localized edema: Secondary | ICD-10-CM

## 2022-01-08 DIAGNOSIS — M25642 Stiffness of left hand, not elsewhere classified: Secondary | ICD-10-CM

## 2022-01-14 NOTE — Therapy (Incomplete)
OUTPATIENT OCCUPATIONAL THERAPY TREATMENT NOTE   Patient Name: April Stevens MRN: [id number] DOB:[birth year], 66 y.o., female Today's Date: 01/08/2022  PCP: Dr. Benay Spice [name] REFERRING PROVIDER: Dr. Sherilyn Cooter    END OF SESSION:   OT End of Session - 01/08/22 1007     Visit Number 11    Number of Visits 24    Date for OT Re-Evaluation 02/05/22    Authorization Type WC eval + 24 visits approved    Authorization - Number of Visits 24    OT Start Time 1008    OT Stop Time 1054    OT Time Calculation (min) 46 min    Activity Tolerance Patient tolerated treatment well;Patient limited by pain;Patient limited by fatigue;No increased pain    Behavior During Therapy WFL for tasks assessed/performed             Past Medical History:  Diagnosis Date   Depression    Hypertension    [name]    Past Surgical History:  Procedure Laterality Date   APPENDECTOMY  09/26/2018   HYSTERECTOMY ABDOMINAL WITH SALPINGO-OOPHORECTOMY  2000   DUB   JOINT REPLACEMENT     LAPAROSCOPIC APPENDECTOMY N/A 09/26/2018   Procedure: APPENDECTOMY LAPAROSCOPIC;  Surgeon: Kinsinger, Arta Bruce, MD;  Location: [place];  Service: General;  Laterality: N/A;   OPEN REDUCTION INTERNAL FIXATION (ORIF) DISTAL RADIAL FRACTURE Left 08/17/2021   Procedure: OPEN REDUCTION INTERNAL FIXATION (ORIF) LEFT DISTAL RADIAL FRACTURE;  Surgeon: Sherilyn Cooter, MD;  Location: [place];  Service: Orthopedics;  Laterality: Left;   SHOULDER SURGERY Left    01-02-2018, has had R shoulder rotator cuff previously   Patient Active Problem List   Diagnosis Date Noted   Closed fracture of left distal radius 08/14/2021   Perforated diverticulum 02/08/2020   Pneumoperitoneum 02/08/2020   HLD (hyperlipidemia) 02/08/2020   Pure hypercholesterolemia 10/02/2019   Prediabetes 10/02/2019   Hypokalemia 03/29/2019   Acute appendicitis 09/26/2018   Obstructive sleep apnea treated with continuous positive  airway pressure (CPAP) 08/01/2017   Inadequate sleep hygiene 02/22/2017   Excessive daytime sleepiness 02/22/2017   Sleep deprivation 02/22/2017   Snoring 02/22/2017   OSA (obstructive sleep apnea) 02/22/2017   HTN (hypertension) 02/23/2012   Anxiety and depression 02/23/2012    ONSET DATE: DOS 08/17/21, 10/29/21 (second surgery)    REFERRING DIAG: J24.268T (ICD-10-CM) - Other closed intra-articular fracture of distal end of left radius, initial encounter  THERAPY DIAG:  Stiffness of left hand, not elsewhere classified  Localized edema  Stiffness of left wrist, not elsewhere classified  Muscle weakness (generalized)  Pain in left wrist  Rationale for Evaluation and Treatment Rehabilitation  PERTINENT HISTORY: Per MD: "Left distal radius fracture s/p ORIF with volar [plate] and dorsal bridge plate."   PRECAUTIONS: 12 weeks s/p DSBP removal. A/A/PROM 4-6 x day 25 reps with end-range long stretches, edema control, resting orthosis; @ 3 weeks start weaning and dorsal taping as needed; 4-6 weeks weighted stretches & dynamics; _0  weeks PRE; return to full use 8-10 weeks.    WEIGHT BEARING RESTRICTIONS No -WBAT now    SUBJECTIVE:  She states ***  she was "taken out of work" about a week ago by the doctor due to increased pain, swelling.  She has repeat x-ray that looked ok but she states he is concerned about acute swelling, so she will also have a CT scan.  She also complains of Lt MF "locking" and "popping" at times in the morning (  describes stenosing tenosynovitis).    PAIN:  Are you having pain? 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  Rating: 0/10 at rest today, uncomfortable with tight fist.     OBJECTIVE: (All objective assessments below are from initial evaluation on: 09/08/21 unless otherwise specified.)   HAND DOMINANCE: Right    ADLs: Overall ADLs: 01/08/22: she has worsening problems at home, and cannot work at all per self-reports      FUNCTIONAL OUTCOME MEASURES: 12/08/21: Quick DASH 45% impairment today   10/21/21:Quick DASH 25% impairment today   Eval: Quck DASH 41% impairment today    UPPER EXTREMITY ROM    11/05/21: she has relatively painless, but tight, limited wrist motion today, making loose full fist still, can oppose, etc.   10/21/21: Full fist, full opposition today Lt hand.    Active ROM Left eval Left  09/14/21 Lt  10/21/21 Lt 11/05/21 Lt 11/17/21 Lt 12/08/21 Lt 12/22/21 Lt 01/08/22  Shoulder flexion ~150  ~160   WNL / no c/o    Shoulder abduction ~150  ~160   WNL / no c/o    Elbow flexion 150  WNL   WNL / no c/o    Elbow extension 0  WNL   WNL / no c/o    Wrist flexion X  x _0 (-4) (-5*) (PROM = 20* and tender)   Wrist extension X  x 42 53 58 49 52* (PROM = 52* and tender)   Wrist ulnar deviation X  x 27 42 37    Wrist radial deviation X  x (-5) 0 (-7)    Wrist pronation 56 74 64 71  78    Wrist supination 71 75 75 70  73    (Blank rows = not tested)   Active ROM Left eval Left 09/25/21 Lt  10/21/21 Lt  12/08/21  She can make loose/full fist with some pain today.  Thumb MCP (0-60)   52  45  Thumb IP (0-80)   56  48  Thumb Palmar abd/add     7cm span  Thumb Opposition to Small Finger 2cm gap to base of SF, but can oppose to pad of SF  Can now touch to base of SF Full opposition to base SF Can still oppose to base of SF with tightness  (Blank rows = not tested)     UPPER EXTREMITY MMT:      MMT Left 09/25/21 Lt  10/21/21 Lt  12/08/21  Elbow flexion  4/5 tender in wrist 5/5 Strength deferred today, as she is painful in hand/wrist from recent exacerbation  Elbow extension 4+/5  5/5   Wrist flexion      Wrist extension      Wrist ulnar deviation      Wrist radial deviation      Wrist pronation   5/5   Wrist supination   5/5   (Blank rows = not tested)   HAND FUNCTION: 12/08/21: gripping deferred today due to tenderness   10/21/21: 36# Lt grip   09/25/21: Right grip: 62#  Left  grip: 19#    COORDINATION: 12/08/21: Box and Blocks Test: 51 Blocks today (61 is WFL)  10/21/21: Box and Blocks Test: 59 Blocks today (61 is WFL)  09/14/21: Box and Blocks Test: 42 Blocks today (61 is WFL)   SENSATION: 12/08/21: states tender over dorsum of IF and still decreased sensation there, though LT is palpable still.   EDEMA:  12/08/21: 18.2cm circumferentially around proximal wrist crease and ulnar styloid.  (First measurement at wrist today due to increased pain/swelling c/o)  Hand MCP Js largely unchanged since last visits.    OBSERVATIONS:          01/08/22: She appears to have tender volar A1 pulley in Lt MF, also palpable tendon knot, pain and locking at middle finger- presents like acute "trigger finger" or stenosing tenosynovitis.  Her dorsum of wrist is more swollen than prior, tender to palpation, still unable to move well or without pain (like she was before and immediately after second surgery).   TODAY'S TREATMENT:  01/19/22: ***Check on tenderness, swelling, tolerance to motion and stretch, check tenosynovitis as well.   01/08/22: Due to continued, acute swelling (new since 1-2 weeks post second surgery), pain and decreased tolerance to motion and resistance, OT continues to advise decreasing/downgrading HEP to allow for more rest/healing.  She was recommended to not bear any weight, not do grip training, not stretch painfully, only do exercises 2-3 x day as tolerated, light motion and very light stretch/PROM.  She should wear brace at all other times, except hygiene.  She states understanding that.   She is also given band-aide, shown how to wear around MF PIP J to prevent triggering finger, and educated no repetitive gripping and rest there to help tenosynovitis heal.  She states understanding. Demo's back very light stretches with no added pain today (as below).   Exercises - Bend and Pull Back Wrist SLOWLY  - 2-3 x daily - 10 reps - "Windshield Wipers"   -  2-3 x daily - 10 reps - Wrist AROM Dart Throwers Motion  - 2-3 x daily - 10 reps - Thumb Opposition  - 2-3 x daily - 10 reps - Tendon Glides  - 2-3 x daily - 3-5 reps - 2-3 seconds hold - Seated Wrist Radial Deviation Stretch  - 2-3 x daily - 3-5 reps - 15 hold - Wrist Flexion Stretch  - 2-3 x daily - 3-5 reps - 15 sec hold - Wrist Extension Stretch Pronated  - 2-3 x daily - 3-5 reps - 15 hold  PATIENT EDUCATION: Education details: See tx section above for details  Person educated: Patient Education method: Verbal Instruction, Teach back, Handouts  Education comprehension: States and demonstrates understanding, Additional Education required      HOME EXERCISE PROGRAM: Access Code: [email or web address] URL: [email or web address]    GOALS: Goals reviewed with patient? Yes     SHORT TERM GOALS: (STG required if POC>30 days)   Pt will obtain protective, custom orthotic. Target date: 09/08/21 Goal status: MET   2.  Pt will demo/state understanding of initial HEP to improve pain levels and prerequisite motion. Target date: 09/18/21 Goal status: 09/14/21: MET     LONG TERM GOALS:   Pt will improve functional ability by decreased impairment per Quick DASH assessment from 41% to 20% or better, for better quality of life. Target date: 02/05/22 Goal status: 01/08/22: NOT MET 12/08/21: Not Met- She rates herself more impaired than ever at this point, 45%   2.  Pt will improve grip strength in left hand to at least 40lbs for functional use at home and in IADLs. Target date: 02/05/22 Goal status: 01/08/22: NOT MET- not tolerating 12/08/21: Not Met -she had been doing well but this was not tolerated today    3.  Pt will improve A/ROM in FA pronation from 56* to at least 70*, to have functional motion for tasks  like reach and grasp.  Target date: 12/11/21 Goal status: 12/08/21: Goal met today   4.  Pt will improve strength in left forearm to at least 4/5 MMT without pain to have  increased functional ability to carry out selfcare and higher-level homecare tasks with no difficulty. Target date: 02/05/22 Goal status: 01/08/22: NOT MET- not tolerating 12/08/21: Not met-this had been improving but due to increasing pain this was not tolerated today   5.  Pt will decrease pain at rest from 5/10 to 1/10 or better to have better sleep and occupational participation in daily roles. Target date: 02/05/22 Goal status: 01/08/22: NOT MET- no pain at the moment, but she has been in significant pain recently due to exacerbation ~3 weeks ago.      ASSESSMENT:   CLINICAL IMPRESSION: 01/19/22: ***  01/08/22: She was doing very well immediately after DBP removal, better motion, full fist, strengthening hand, and then after 11/17/21 visit, she stated hurting her wrist at home, pushing herself out of bed.  After that time, she has had worsening swelling, redness, tenderness, lack of full fist, and now emerging tenosynovitis issues, etc. She has been progressively "worse" in past 3 weeks, especially after she attempted to return to work.  CT will be helpful to rule out internal problems that may be causing this exacerbation.  OT is forced to downgrade therapy, and start back to "square one," to a certain degree. If no improvement with increased rest in a few more weeks, it could be sign of damage that is beyond the outpatient therapy treatment ability, and she will be sent back to her doctor for other avenues of treatment.     PLAN: OT FREQUENCY: 1x/week    OT DURATION: additional 4 weeks (from 01/08/22- 02/05/22 as needed)    PLANNED INTERVENTIONS: self care/ADL training, therapeutic exercise, therapeutic activity, neuromuscular re-education, manual therapy, scar mobilization, passive range of motion, splinting, electrical stimulation, ultrasound, fluidotherapy, compression bandaging, moist heat, cryotherapy, contrast bath, patient/family education, energy conservation, coping strategies  training, and Re-evaluation   RECOMMENDED OTHER SERVICES: none now    CONSULTED AND AGREED WITH PLAN OF CARE: Patient   PLAN FOR NEXT SESSION:  ***   Benito Mccreedy, OTR/L, CHT 01/08/2022, 12:24 PM

## 2022-01-19 ENCOUNTER — Encounter: Payer: Medicare PPO | Admitting: Rehabilitative and Restorative Service Providers"

## 2022-01-28 ENCOUNTER — Encounter: Payer: Self-pay | Admitting: Orthopedic Surgery

## 2022-03-21 ENCOUNTER — Emergency Department (HOSPITAL_BASED_OUTPATIENT_CLINIC_OR_DEPARTMENT_OTHER): Payer: Medicare PPO | Admitting: Radiology

## 2022-03-21 ENCOUNTER — Other Ambulatory Visit: Payer: Self-pay

## 2022-03-21 ENCOUNTER — Encounter (HOSPITAL_BASED_OUTPATIENT_CLINIC_OR_DEPARTMENT_OTHER): Payer: Self-pay | Admitting: *Deleted

## 2022-03-21 ENCOUNTER — Observation Stay (HOSPITAL_BASED_OUTPATIENT_CLINIC_OR_DEPARTMENT_OTHER)
Admission: EM | Admit: 2022-03-21 | Discharge: 2022-03-23 | Disposition: A | Discharge status: Home / Self Care | Payer: Medicare PPO | Attending: Internal Medicine | Admitting: Internal Medicine

## 2022-03-21 DIAGNOSIS — R32 Unspecified urinary incontinence: Secondary | ICD-10-CM | POA: Insufficient documentation

## 2022-03-21 DIAGNOSIS — Z79899 Other long term (current) drug therapy: Secondary | ICD-10-CM | POA: Insufficient documentation

## 2022-03-21 DIAGNOSIS — Z9660 Presence of unspecified orthopedic joint implant: Secondary | ICD-10-CM | POA: Diagnosis not present

## 2022-03-21 DIAGNOSIS — Z87891 Personal history of nicotine dependence: Secondary | ICD-10-CM | POA: Insufficient documentation

## 2022-03-21 DIAGNOSIS — I10 Essential (primary) hypertension: Secondary | ICD-10-CM | POA: Insufficient documentation

## 2022-03-21 DIAGNOSIS — R059 Cough, unspecified: Secondary | ICD-10-CM | POA: Diagnosis present

## 2022-03-21 DIAGNOSIS — J189 Pneumonia, unspecified organism: Secondary | ICD-10-CM | POA: Diagnosis not present

## 2022-03-21 DIAGNOSIS — E785 Hyperlipidemia, unspecified: Secondary | ICD-10-CM | POA: Diagnosis present

## 2022-03-21 DIAGNOSIS — G4733 Obstructive sleep apnea (adult) (pediatric): Secondary | ICD-10-CM

## 2022-03-21 DIAGNOSIS — Z7982 Long term (current) use of aspirin: Secondary | ICD-10-CM | POA: Insufficient documentation

## 2022-03-21 DIAGNOSIS — E876 Hypokalemia: Secondary | ICD-10-CM | POA: Insufficient documentation

## 2022-03-21 DIAGNOSIS — F419 Anxiety disorder, unspecified: Secondary | ICD-10-CM | POA: Diagnosis present

## 2022-03-21 DIAGNOSIS — F32A Depression, unspecified: Secondary | ICD-10-CM | POA: Diagnosis present

## 2022-03-21 DIAGNOSIS — Z1152 Encounter for screening for COVID-19: Secondary | ICD-10-CM | POA: Diagnosis not present

## 2022-03-21 DIAGNOSIS — R0902 Hypoxemia: Secondary | ICD-10-CM

## 2022-03-21 LAB — MAGNESIUM: Magnesium: 1.6 mg/dL — ABNORMAL LOW (ref 1.7–2.4)

## 2022-03-21 LAB — CREATININE, SERUM
Creatinine, Ser: 0.78 mg/dL (ref 0.44–1.00)
GFR, Estimated: >60 mL/min (ref >60)

## 2022-03-21 LAB — CBC WITH DIFFERENTIAL/PLATELET
Abs Immature Granulocytes: 0.01 10*3/uL (ref 0.00–0.07)
Basophils Absolute: 0 10*3/uL (ref 0.0–0.1)
Basophils Relative: 1 %
Eosinophils Absolute: 0.2 10*3/uL (ref 0.0–0.5)
Eosinophils Relative: 4 %
HCT: 37.6 % (ref 36.0–46.0)
Hemoglobin: 13.4 g/dL (ref 12.0–15.0)
Immature Granulocytes: 0 %
Lymphocytes Relative: 36 %
Lymphs Abs: 2 10*3/uL (ref 0.7–4.0)
MCH: 30 pg (ref 26.0–34.0)
MCHC: 35.6 g/dL (ref 30.0–36.0)
MCV: 84.3 fL (ref 80.0–100.0)
Monocytes Absolute: 0.4 10*3/uL (ref 0.1–1.0)
Monocytes Relative: 8 %
Neutro Abs: 2.9 10*3/uL (ref 1.7–7.7)
Neutrophils Relative %: 51 %
Platelets: 284 10*3/uL (ref 150–400)
RBC: 4.46 MIL/uL (ref 3.87–5.11)
RDW: 13.5 % (ref 11.5–15.5)
WBC: 5.5 10*3/uL (ref 4.0–10.5)
nRBC: 0 % (ref 0.0–0.2)

## 2022-03-21 LAB — RESP PANEL BY RT-PCR (RSV, FLU A&B, COVID)  RVPGX2
Influenza A by PCR: NEGATIVE
Influenza B by PCR: NEGATIVE
Resp Syncytial Virus by PCR: NEGATIVE
SARS Coronavirus 2 by RT PCR: NEGATIVE

## 2022-03-21 LAB — CBC
HCT: 35.8 % — ABNORMAL LOW (ref 36.0–46.0)
Hemoglobin: 12.6 g/dL (ref 12.0–15.0)
MCH: 30.4 pg (ref 26.0–34.0)
MCHC: 35.2 g/dL (ref 30.0–36.0)
MCV: 86.3 fL (ref 80.0–100.0)
Platelets: 271 10*3/uL (ref 150–400)
RBC: 4.15 MIL/uL (ref 3.87–5.11)
RDW: 13.5 % (ref 11.5–15.5)
WBC: 5.2 10*3/uL (ref 4.0–10.5)
nRBC: 0 % (ref 0.0–0.2)

## 2022-03-21 LAB — BASIC METABOLIC PANEL
Anion gap: 11 (ref 5–15)
BUN: 13 mg/dL (ref 8–23)
CO2: 27 mmol/L (ref 22–32)
Calcium: 9.1 mg/dL (ref 8.9–10.3)
Chloride: 103 mmol/L (ref 98–111)
Creatinine, Ser: 0.86 mg/dL (ref 0.44–1.00)
GFR, Estimated: >60 mL/min (ref >60)
Glucose, Bld: 181 mg/dL — ABNORMAL HIGH (ref 70–99)
Potassium: 2.8 mmol/L — ABNORMAL LOW (ref 3.5–5.1)
Sodium: 141 mmol/L (ref 135–145)

## 2022-03-21 MED ORDER — MAGNESIUM OXIDE -MG SUPPLEMENT 400 (240 MG) MG PO TABS
400.0000 mg | ORAL_TABLET | Freq: Once | ORAL | Status: AC
  Administered 2022-03-21: 400 mg via ORAL
  Filled 2022-03-21: qty 1

## 2022-03-21 MED ORDER — ONDANSETRON HCL 4 MG PO TABS: 4.0000 mg | ORAL_TABLET | Freq: Four times a day (QID) | ORAL | Status: DC | PRN

## 2022-03-21 MED ORDER — POTASSIUM CHLORIDE CRYS ER 20 MEQ PO TBCR
40.0000 meq | EXTENDED_RELEASE_TABLET | Freq: Once | ORAL | Status: AC
  Administered 2022-03-21: 40 meq via ORAL
  Filled 2022-03-21: qty 2

## 2022-03-21 MED ORDER — SODIUM CHLORIDE 0.9 % IV SOLN
500.0000 mg | Freq: Once | INTRAVENOUS | Status: AC
  Administered 2022-03-21: 500 mg via INTRAVENOUS
  Filled 2022-03-21: qty 5

## 2022-03-21 MED ORDER — SODIUM CHLORIDE 0.9 % IV SOLN
500.0000 mg | INTRAVENOUS | Status: DC
  Administered 2022-03-22: 500 mg via INTRAVENOUS
  Filled 2022-03-21 (×2): qty 5

## 2022-03-21 MED ORDER — ONDANSETRON HCL 4 MG/2ML IJ SOLN: 4.0000 mg | Freq: Four times a day (QID) | INTRAMUSCULAR | Status: DC | PRN

## 2022-03-21 MED ORDER — SODIUM CHLORIDE 0.9 % IV SOLN
1.0000 g | INTRAVENOUS | Status: DC
  Administered 2022-03-22: 1 g via INTRAVENOUS
  Filled 2022-03-21: qty 10

## 2022-03-21 MED ORDER — ACETAMINOPHEN 500 MG PO TABS
1000.0000 mg | ORAL_TABLET | Freq: Once | ORAL | Status: AC
  Administered 2022-03-21: 1000 mg via ORAL
  Filled 2022-03-21: qty 2

## 2022-03-21 MED ORDER — FLUOXETINE HCL 20 MG PO CAPS
60.0000 mg | ORAL_CAPSULE | Freq: Every day | ORAL | Status: DC
  Administered 2022-03-21 – 2022-03-22 (×2): 60 mg via ORAL
  Filled 2022-03-21 (×3): qty 3

## 2022-03-21 MED ORDER — ATORVASTATIN CALCIUM 10 MG PO TABS: 20.0000 mg | ORAL_TABLET | Freq: Every day | ORAL | Status: DC

## 2022-03-21 MED ORDER — ENOXAPARIN SODIUM 40 MG/0.4ML IJ SOSY
40.0000 mg | PREFILLED_SYRINGE | INTRAMUSCULAR | Status: DC
  Administered 2022-03-21 – 2022-03-22 (×2): 40 mg via SUBCUTANEOUS
  Filled 2022-03-21 (×2): qty 0.4

## 2022-03-21 MED ORDER — ACETAMINOPHEN 325 MG PO TABS: 650.0000 mg | ORAL_TABLET | Freq: Four times a day (QID) | ORAL | Status: DC | PRN

## 2022-03-21 MED ORDER — SODIUM CHLORIDE 0.9 % IV SOLN
1.0000 g | Freq: Once | INTRAVENOUS | Status: AC
  Administered 2022-03-21: 1 g via INTRAVENOUS
  Filled 2022-03-21 (×2): qty 10

## 2022-03-21 MED ORDER — ASPIRIN 81 MG PO TBEC
81.0000 mg | DELAYED_RELEASE_TABLET | Freq: Every day | ORAL | Status: DC
  Administered 2022-03-21 – 2022-03-22 (×2): 81 mg via ORAL
  Filled 2022-03-21 (×2): qty 1

## 2022-03-21 MED ORDER — IPRATROPIUM-ALBUTEROL 0.5-2.5 (3) MG/3ML IN SOLN
3.0000 mL | Freq: Once | RESPIRATORY_TRACT | Status: AC
Start: 2022-03-21 — End: 2022-03-21
  Administered 2022-03-21: 3 mL via RESPIRATORY_TRACT
  Filled 2022-03-21: qty 3

## 2022-03-21 MED ORDER — SODIUM CHLORIDE 0.9 % IV BOLUS
1000.0000 mL | Freq: Once | INTRAVENOUS | Status: AC
  Administered 2022-03-21: 1000 mL via INTRAVENOUS

## 2022-03-21 MED ORDER — TRAZODONE HCL 50 MG PO TABS
25.0000 mg | ORAL_TABLET | Freq: Every evening | ORAL | Status: DC | PRN
  Administered 2022-03-22: 25 mg via ORAL
  Filled 2022-03-21: qty 1

## 2022-03-21 MED ORDER — METOPROLOL TARTRATE 5 MG/5ML IV SOLN: 5.0000 mg | Freq: Four times a day (QID) | INTRAVENOUS | Status: DC | PRN

## 2022-03-21 MED ORDER — ACETAMINOPHEN 650 MG RE SUPP: 650.0000 mg | Freq: Four times a day (QID) | RECTAL | Status: DC | PRN

## 2022-03-21 NOTE — ED Notes (Signed)
Patient was placed on White 4L by APP, patient states this was approximately 6mn ago. Patient sating 95% on 4L, still coughing. Patient states she does feel less short of breath. No audible wheezing at this time. Patient breathing does not seem as labored as on first arrival to ED.

## 2022-03-21 NOTE — ED Notes (Signed)
Patient assisted to restroom, 90-93% on RA

## 2022-03-21 NOTE — H&P (Signed)
History and Physical  April Stevens [id number] DOB: 01/29/1955 DOA: 03/21/2022  PCP: [name], [name], MD   Patient coming from: Home   Chief Complaint: cough, fever   HPI: April Stevens is a 67 y.o. female with medical history significant for depression, hypertension, [name] being admitted to the hospital with community-acquired pneumonia.  She says that she started feeling unwell about 5 days ago, some malaise, some tiredness, some nonproductive cough.  Over the last 3 to 4 days, she has been noticing elevated temperature, as high as 102.  Decided to come to the ER for evaluation today as she just was not feeling better.  No treatment or therapy prior to presentation to the ER this morning.   ED Course: Evaluation at the [facility], she was noted to have high blood pressure 153/57, temperature of 100 F, slightly tachypneic at 24, saturating 92% on room air.  Lab work was promptly obtained, which showed normal CBC, but a low potassium of 2.8.  He was given empiric IV azithromycin and Rocephin, some IV fluids, and hospitalist was contacted for admission.  Respiratory panel was obtained and negative, she was also given oral potassium prior to transfer to [facility].  Currently she is resting comfortably, has no complaints and says that she wishes she could go home.  Review of Systems: Please see HPI for pertinent positives and negatives. A complete 10 system review of systems are otherwise negative.  Past Medical History:  Diagnosis Date   Depression    Hypertension    [name]    Past Surgical History:  Procedure Laterality Date   APPENDECTOMY  09/26/2018   HYSTERECTOMY ABDOMINAL WITH SALPINGO-OOPHORECTOMY  2000   DUB   JOINT REPLACEMENT     LAPAROSCOPIC APPENDECTOMY N/A 09/26/2018   Procedure: APPENDECTOMY LAPAROSCOPIC;  Surgeon: Kinsinger, Arta Bruce, MD;  Location: [place] [facility];  Service: General;  Laterality: N/A;   OPEN REDUCTION INTERNAL FIXATION (ORIF) DISTAL RADIAL  FRACTURE Left 08/17/2021   Procedure: OPEN REDUCTION INTERNAL FIXATION (ORIF) LEFT DISTAL RADIAL FRACTURE;  Surgeon: Sherilyn Cooter, MD;  Location: [facility];  Service: Orthopedics;  Laterality: Left;   SHOULDER SURGERY Left    01-02-2018, has had R shoulder rotator cuff previously    Social History:  reports that she quit smoking about 24 years ago. Her smoking use included cigarettes. She has a 60.00 pack-year smoking history. She has never used smokeless tobacco. She reports current alcohol use. She reports that she does not use drugs.   No Known Allergies  Family History  Problem Relation Age of Onset   Hypertension Father    Heart attack Father    Sleep apnea Neg Hx      Prior to Admission medications   Medication Sig Start Date End Date Taking? Authorizing Provider  aspirin 81 MG tablet You can restart your baby aspirin 1 daily 09/28/2018. Patient taking differently: Take 81 mg by mouth at bedtime. 09/26/18   Earnstine Regal, PA-C  atorvastatin (LIPITOR) 20 MG tablet Take 1 tablet (20 mg total) by mouth daily. Patient taking differently: Take 20 mg by mouth at bedtime. 10/02/19   Daleen Squibb, MD  chlorthalidone (HYGROTON) 25 MG tablet Take 25 mg by mouth daily. 03/22/20   [provider]  Cyanocobalamin (B-12 PO) Take by mouth daily at 2 PM.    [provider]  FLUoxetine (PROZAC) 20 MG capsule Take 3 capsules (60 mg total) by mouth daily. Patient taking  differently: Take 60 mg by mouth at bedtime. 10/02/19   [name], [name], MD  potassium chloride SA (KLOR-CON M) 20 MEQ tablet Take 20 mEq by mouth 2 (two) times daily.    [provider]    Physical Exam: BP (!) 164/88 (BP Location: Left Arm)   Pulse 75   Temp 99.6 F (37.6 C) (Oral)   Resp 18   Ht '5\' 1"'$  (1.549 m)   SpO2 98%   BMI 34.24 kg/m   General:  Alert, oriented, calm, in no acute distress, no cough, looks comfortable on oxygen, nontoxic in appearance.   Speaking in full sentences without any discomfort, tachypnea, etc. Eyes: EOMI, clear conjuctivae, white sclerea Neck: supple, no masses, trachea mildline  Cardiovascular: RRR, no murmurs or rubs, no peripheral edema  Respiratory: clear to auscultation bilaterally, no wheezes, no crackles  Abdomen: soft, nontender, nondistended, normal bowel tones heard  Skin: dry, no rashes  Musculoskeletal: no joint effusions, normal range of motion  Psychiatric: appropriate affect, normal speech  Neurologic: extraocular muscles intact, clear speech, moving all extremities with intact sensorium          Labs on Admission:  Basic Metabolic Panel: Recent Labs  Lab 03/21/22 1201  NA 141  K 2.8*  CL 103  CO2 27  GLUCOSE 181*  BUN 13  CREATININE 0.86  CALCIUM 9.1  MG 1.6*   Liver Function Tests: No results for input(s): "AST", "ALT", "ALKPHOS", "BILITOT", "PROT", "ALBUMIN" in the last 168 hours. No results for input(s): "LIPASE", "AMYLASE" in the last 168 hours. No results for input(s): "AMMONIA" in the last 168 hours. CBC: Recent Labs  Lab 03/21/22 1201  WBC 5.5  NEUTROABS 2.9  HGB 13.4  HCT 37.6  MCV 84.3  PLT 284   Cardiac Enzymes: No results for input(s): "CKTOTAL", "CKMB", "CKMBINDEX", "TROPONINI" in the last 168 hours.  BNP (last 3 results) No results for input(s): "BNP" in the last 8760 hours.  ProBNP (last 3 results) No results for input(s): "PROBNP" in the last 8760 hours.  CBG: No results for input(s): "GLUCAP" in the last 168 hours.  Radiological Exams on Admission: DG Chest 2 View  Result Date: 03/21/2022 CLINICAL DATA:  Cough and fever EXAM: CHEST - 2 VIEW COMPARISON:  Chest x-ray January 08, 2021 FINDINGS: Bibasilar infiltrates, right greater than left. The heart, hila, mediastinum, lungs, and pleura are otherwise unremarkable. IMPRESSION: Bibasilar infiltrates concerning for pneumonia given history. Recommend short-term follow-up imaging after treatment to ensure  resolution. Electronically Signed   By: Dorise Bullion III M.D.   On: 03/21/2022 11:28    Assessment/Plan Principal Problem:   PNA (pneumonia) -with fever, new hypoxia, bibasilar infiltrates right greater than left seen on chest x-ray this morning. -Observation admission -Supportive care -Continue empiric IV azithromycin and Rocephin -Incentive spirometry and flutter valve -Would recommend short interval follow-up imaging to document complete resolution Active Problems:   HTN (hypertension) -continue home aspirin, Lopressor as needed   Anxiety and depression -continue home Prozac   Obstructive sleep apnea treated with continuous positive airway pressure (CPAP)   Hypokalemia -repleted orally, will recheck in the morning, note hypomagnesemia will replete as well   HLD (hyperlipidemia)  DVT prophylaxis: Lovenox  Code Status: Full code  Admission status: Obs, likely hom ein AM if weaned to room air.   Time spent: 38 minutes  Jaima Janney Neva Seat MD [facility] Pager [phone]  If 7PM-7AM, please contact night-coverage [email or web address] Password [facility]  03/21/2022, 5:55 PM

## 2022-03-21 NOTE — ED Notes (Signed)
Called [facility] -- informed that the patient Bed Assignment is Ready

## 2022-03-21 NOTE — ED Provider Notes (Signed)
[facility] Provider Note   CSN: [id number] Arrival date & time: 03/21/22  1039     History  Chief Complaint  Patient presents with   Fever   Shortness of Breath   Cough    April Stevens is a 67 y.o. female.  With a history of hypertension, depression, OSA, anxiety who presents to the ED for evaluation of cough and fever.  Symptoms began on Tuesday of last week.  States have persisted.  She has been checking her temperature at home found to be between 101 101 F.  She has not taken any antipyretics at home.  Her cough is nonproductive.  She has some shortness of breath with exertion that resolves shortly after resting.  She also reports some wheezing.  Denies history of bronchitis, COPD, asthma.  States she stopped smoking 20 years ago.  Also reports bilateral lower rib pain due to the coughing.  Denies upper chest pain.  Denies congestion, rhinorrhea.  States his sister developed similar symptoms shortly after she did.  Tested for COVID at home and was negative.  70   Fever Associated symptoms: cough   Shortness of Breath Associated symptoms: cough and fever   Cough Associated symptoms: fever and shortness of breath        Home Medications Prior to Admission medications   Medication Sig Start Date End Date Taking? Authorizing Provider  aspirin 81 MG tablet You can restart your baby aspirin 1 daily 09/28/2018. Patient taking differently: Take 81 mg by mouth at bedtime. 09/26/18   Earnstine Regal, PA-C  atorvastatin (LIPITOR) 20 MG tablet Take 1 tablet (20 mg total) by mouth daily. Patient taking differently: Take 20 mg by mouth at bedtime. 10/02/19   Daleen Squibb, MD  chlorthalidone (HYGROTON) 25 MG tablet Take 25 mg by mouth daily. 03/22/20   [provider]  Cyanocobalamin (B-12 PO) Take by mouth daily at 2 PM.    [provider]  FLUoxetine (PROZAC) 20 MG capsule Take 3 capsules (60 mg total) by mouth  daily. Patient taking differently: Take 60 mg by mouth at bedtime. 10/02/19   [name], [name], MD  potassium chloride SA (KLOR-CON M) 20 MEQ tablet Take 20 mEq by mouth 2 (two) times daily.    [provider]      Allergies    Patient has no known allergies.    Review of Systems   Review of Systems  Constitutional:  Positive for fever.  Respiratory:  Positive for cough and shortness of breath.   All other systems reviewed and are negative.   Physical Exam Updated Vital Signs BP 138/82 (BP Location: Right Arm)   Pulse 77   Temp 99.6 F (37.6 C) (Oral)   Resp 20   SpO2 92%  Physical Exam Vitals and nursing note reviewed.  Constitutional:      General: She is not in acute distress.    Appearance: She is well-developed. She is not ill-appearing, toxic-appearing or diaphoretic.     Comments: Resting comfortably in bed  HENT:     Head: Normocephalic and atraumatic.  Eyes:     Conjunctiva/sclera: Conjunctivae normal.  Cardiovascular:     Rate and Rhythm: Normal rate and regular rhythm.     Heart sounds: No murmur heard. Pulmonary:     Effort: Pulmonary effort is normal. No respiratory distress.     Breath sounds: Examination of the right-upper field reveals wheezing. Wheezing present. No decreased breath  sounds, rhonchi or rales.  Abdominal:     Palpations: Abdomen is soft.     Tenderness: There is no abdominal tenderness.  Musculoskeletal:        General: No swelling.     Cervical back: Neck supple.     Right lower leg: No edema.     Left lower leg: No edema.  Skin:    General: Skin is warm and dry.     Capillary Refill: Capillary refill takes less than 2 seconds.  Neurological:     General: No focal deficit present.     Mental Status: She is alert and oriented to person, place, and time.  Psychiatric:        Mood and Affect: Mood normal.     ED Results / Procedures / Treatments   Labs (all labs ordered are listed, but only abnormal results are  displayed) Labs Reviewed  BASIC METABOLIC PANEL - Abnormal; Notable for the following components:      Result Value   Potassium 2.8 (*)    Glucose, Bld 181 (*)    All other components within normal limits  MAGNESIUM - Abnormal; Notable for the following components:   Magnesium 1.6 (*)    All other components within normal limits  RESP PANEL BY RT-PCR (RSV, FLU A&B, COVID)  [id number]  CBC WITH DIFFERENTIAL/PLATELET    EKG EKG Interpretation  Date/Time:  Sunday March 21 2022 15:13:01 EST Ventricular Rate:  82 PR Interval:  147 QRS Duration: 94 QT Interval:  389 QTC Calculation: 455 R Axis:   -25 Text Interpretation: Sinus rhythm Borderline left axis deviation Nonspecific T abnormalities, lateral leads poor baseline Confirmed by Regan Lemming ([id number]) on 03/21/2022 3:15:59 PM  Radiology DG Chest 2 View  Result Date: 03/21/2022 CLINICAL DATA:  Cough and fever EXAM: CHEST - 2 VIEW COMPARISON:  Chest x-ray January 08, 2021 FINDINGS: Bibasilar infiltrates, right greater than left. The heart, hila, mediastinum, lungs, and pleura are otherwise unremarkable. IMPRESSION: Bibasilar infiltrates concerning for pneumonia given history. Recommend short-term follow-up imaging after treatment to ensure resolution. Electronically Signed   By: Dorise Bullion III M.D.   On: 03/21/2022 11:28    Procedures Procedures    Medications Ordered in ED Medications  cefTRIAXone (ROCEPHIN) 1 g in sodium chloride 0.9 % 100 mL IVPB (has no administration in time range)  azithromycin (ZITHROMAX) 500 mg in sodium chloride 0.9 % 250 mL IVPB (500 mg Intravenous New Bag/Given 03/21/22 1457)  sodium chloride 0.9 % bolus 1,000 mL (has no administration in time range)  ipratropium-albuterol (DUONEB) 0.5-2.5 (3) MG/3ML nebulizer solution 3 mL (3 mLs Nebulization Given 03/21/22 1124)  acetaminophen (TYLENOL) tablet 1,000 mg (1,000 mg Oral Given 03/21/22 1155)  potassium chloride SA (KLOR-CON M) CR tablet 40 mEq (40 mEq Oral Given  03/21/22 1349)  magnesium oxide (MAG-OX) tablet 400 mg (400 mg Oral Given 03/21/22 1444)    ED Course/ Medical Decision Making/ A&P Clinical Course as of 03/21/22 1522  Sun Mar 21, 2022  1504 Spoke with hospitalist who will admit for pneumonia [AS]    Clinical Course User Index [AS] Nehemiah Massed                     PORT Score: 58        Medical Decision Making Amount and/or Complexity of Data Reviewed Labs: ordered. Radiology: ordered.  Risk OTC drugs. Prescription drug management.  This patient presents to the ED for concern of [place] room emergently  for cough, fever, this involves an extensive number of treatment options, and is a complaint that carries with it a high risk of complications and morbidity.  The differential diagnosis includes bronchitis, viral URI, pneumonia  Co morbidities that complicate the patient evaluation  hypertension, depression, OSA, anxiety  My initial workup includes chest x-ray, respiratory panel, Tylenol, DuoNeb  Additional history obtained from: Nursing notes from this visit.  I ordered, reviewed and interpreted labs which include: Respiratory panel, basic labs.  Hypokalemia of 2.8.  Hypomagnesemia 1.6.  No leukocytosis  I ordered imaging studies including chest x-ray I independently visualized and interpreted imaging which showed bibasilar pneumonia I agree with the radiologist interpretation  Cardiac Monitoring:  The patient was maintained on a cardiac monitor.  I personally viewed and interpreted the cardiac monitored which showed an underlying rhythm of: NSR  Consultations Obtained:  I requested consultation with the hospitalist Dr. Verlon Au,  and discussed lab and imaging findings as well as pertinent plan - they recommend: Admission for antibiotics and continued monitoring  Initially borderline febrile which improved after Tylenol.  Hypoxic and tachypneic on room air.  Which minimally improved with supplemental oxygen on  nasal cannula.  67 year old female presents to the ED for evaluation of cough and wheezing.  Symptoms have been present for the past 4 days.  She denies shortness of breath or chest pain.  Does not know what her pulse ox typically is, but was hypoxic on arrival and remained hypoxic on room air and borderline tachypneic.  Chest x-ray showed bilateral lower lobe pneumonias.  Patient appears overall very well, however would likely benefit from admission due to hypoxia.  Was started on Rocephin and azithromycin for community-acquired pneumonia.  Her pulse ox did improve to 95% on 4 L nasal cannula.  Stable at the time of admission.  Patient's case discussed with Dr. Armandina Gemma who agrees with plan to admit.   Note: Portions of this report may have been transcribed using voice recognition software. Every effort was made to ensure accuracy; however, inadvertent computerized transcription errors may still be present.        Final Clinical Impression(s) / ED Diagnoses Final diagnoses:  Community acquired pneumonia, unspecified laterality  Hypoxia    Rx / DC Orders ED Discharge Orders     None         Nehemiah Massed 03/21/22 1522    Regan Lemming, MD 03/21/22 [phone]

## 2022-03-21 NOTE — ED Triage Notes (Addendum)
Patient reports fever, cough, and shortness of breath. States started feeling bad on Wednesday, and has progressively worsened, no history of shortness of breath. Took morning meds, no otc antipyretics. Took nyquil last night for fever/cough. Reports rib cage pain from coughing.

## 2022-03-21 NOTE — ED Notes (Signed)
Lab to add on magnesium to previous blood

## 2022-03-21 NOTE — Hospital Course (Signed)
Patient with h/o [name], HTN, and diverticulitis with perforation in 2022) presenting with cough and SOB.  CXR showed pneumonia, treated with Rocephin and Azithromycin.  Persistent tachypnea and new O2 requirement.

## 2022-03-22 DIAGNOSIS — J189 Pneumonia, unspecified organism: Secondary | ICD-10-CM | POA: Diagnosis not present

## 2022-03-22 LAB — BASIC METABOLIC PANEL
Anion gap: 9 (ref 5–15)
BUN: 11 mg/dL (ref 8–23)
CO2: 26 mmol/L (ref 22–32)
Calcium: 8.4 mg/dL — ABNORMAL LOW (ref 8.9–10.3)
Chloride: 104 mmol/L (ref 98–111)
Creatinine, Ser: 0.78 mg/dL (ref 0.44–1.00)
GFR, Estimated: >60 mL/min (ref >60)
Glucose, Bld: 123 mg/dL — ABNORMAL HIGH (ref 70–99)
Potassium: 3.1 mmol/L — ABNORMAL LOW (ref 3.5–5.1)
Sodium: 139 mmol/L (ref 135–145)

## 2022-03-22 LAB — CBC
HCT: 36.2 % (ref 36.0–46.0)
Hemoglobin: 12.6 g/dL (ref 12.0–15.0)
MCH: 30.2 pg (ref 26.0–34.0)
MCHC: 34.8 g/dL (ref 30.0–36.0)
MCV: 86.8 fL (ref 80.0–100.0)
Platelets: 275 10*3/uL (ref 150–400)
RBC: 4.17 MIL/uL (ref 3.87–5.11)
RDW: 13.6 % (ref 11.5–15.5)
WBC: 5.1 10*3/uL (ref 4.0–10.5)
nRBC: 0 % (ref 0.0–0.2)

## 2022-03-22 LAB — HIV ANTIBODY (ROUTINE TESTING W REFLEX): HIV Screen 4th Generation wRfx: NONREACTIVE

## 2022-03-22 MED ORDER — GUAIFENESIN ER 600 MG PO TB12: 600.0000 mg | ORAL_TABLET | Freq: Two times a day (BID) | ORAL | Status: DC | PRN

## 2022-03-22 MED ORDER — ALBUTEROL SULFATE (2.5 MG/3ML) 0.083% IN NEBU: 2.5000 mg | INHALATION_SOLUTION | RESPIRATORY_TRACT | Status: DC | PRN

## 2022-03-22 MED ORDER — MAGNESIUM SULFATE 2 GM/50ML IV SOLN
2.0000 g | Freq: Once | INTRAVENOUS | Status: AC
  Administered 2022-03-22: 2 g via INTRAVENOUS
  Filled 2022-03-22: qty 50

## 2022-03-22 MED ORDER — CHLORTHALIDONE 25 MG PO TABS
25.0000 mg | ORAL_TABLET | Freq: Every day | ORAL | Status: DC
  Administered 2022-03-23: 25 mg via ORAL
  Filled 2022-03-22: qty 1

## 2022-03-22 MED ORDER — POTASSIUM CHLORIDE CRYS ER 20 MEQ PO TBCR
40.0000 meq | EXTENDED_RELEASE_TABLET | ORAL | Status: AC
  Administered 2022-03-22 (×2): 40 meq via ORAL
  Filled 2022-03-22 (×2): qty 2

## 2022-03-22 MED ORDER — OXYBUTYNIN CHLORIDE ER 5 MG PO TB24: 10.0000 mg | ORAL_TABLET | Freq: Every day | ORAL | Status: DC

## 2022-03-22 NOTE — Care Management CC44 (Signed)
Condition Code 44 Documentation Completed  Patient Details  Name: BRITAIN SCHUPP MRN: [id number] Date of Birth: [birth year]   Condition Code 44 given:  Yes Patient signature on Condition Code 44 notice:  Yes Documentation of 2 MD's agreement:  Yes Code 44 added to claim:  Yes    Vassie Moselle, LCSW 03/22/2022, 11:20 AM

## 2022-03-22 NOTE — Care Management Obs Status (Signed)
[facility] NOTIFICATION   Patient Details  Name: April Stevens MRN: [id number] Date of Birth: 1955/11/11   Medicare Observation Status Notification Given:  Yes    Vassie Moselle, LCSW 03/22/2022, 11:20 AM

## 2022-03-22 NOTE — TOC Initial Note (Signed)
Transition of Care [facility]) - Initial/Assessment Note    Patient Details  Name: April Stevens MRN: [id number] Date of Birth: [birth year]  Transition of Care [facility]) CM/SW Contact:    Ninfa Meeker, RN Phone Number: 03/22/2022, 10:08 AM  Clinical Narrative:                  Patient is a 67 yr old female, from home with cough, fever. Has pneumonia, on RA. Independent prior to admission.   Transition of Care [facility]) Department has reviewed patient and no TOC needs have been identified at this time. We will continue to monitor patient advancement through Interdisciplinary progressions and if new patient needs arise, please place a consult.     Patient Goals and CMS Choice            Expected Discharge Plan and Services                                              Prior Living Arrangements/Services                       Activities of Daily Living Home Assistive Devices/Equipment: None ADL Screening (condition at time of admission) Patient's cognitive ability adequate to safely complete daily activities?: Yes Is the patient deaf or have difficulty hearing?: No Does the patient have difficulty seeing, even when wearing glasses/contacts?: No Does the patient have difficulty concentrating, remembering, or making decisions?: No Patient able to express need for assistance with ADLs?: Yes Does the patient have difficulty dressing or bathing?: No Independently performs ADLs?: Yes (appropriate for developmental age) Does the patient have difficulty walking or climbing stairs?: No Weakness of Legs: None Weakness of Arms/Hands: None  Permission Sought/Granted                  Emotional Assessment              Admission diagnosis:  Hypoxia [R09.02] PNA (pneumonia) [J18.9] Community acquired pneumonia, unspecified laterality [J18.9] CAP (community acquired pneumonia) [J18.9] Patient Active Problem List   Diagnosis Date Noted   PNA (pneumonia)  03/21/2022   CAP (community acquired pneumonia) 03/21/2022   Closed fracture of left distal radius 08/14/2021   Perforated diverticulum 02/08/2020   Pneumoperitoneum 02/08/2020   HLD (hyperlipidemia) 02/08/2020   Pure hypercholesterolemia 10/02/2019   Prediabetes 10/02/2019   Hypokalemia 03/29/2019   Acute appendicitis 09/26/2018   Obstructive sleep apnea treated with continuous positive airway pressure (CPAP) 08/01/2017   Inadequate sleep hygiene 02/22/2017   Excessive daytime sleepiness 02/22/2017   Sleep deprivation 02/22/2017   Snoring 02/22/2017   OSA (obstructive sleep apnea) 02/22/2017   HTN (hypertension) 02/23/2012   Anxiety and depression 02/23/2012   PCP:  Daleen Squibb, MD Pharmacy:   [id number] [address] [place]Phone: [phone]: [phone]    Social Determinants of Health (SDOH) Social History: [place] No Food Insecurity (03/21/2022)  Housing: Low Risk  (03/21/2022)  Transportation Needs: No Transportation Needs (03/21/2022)  Utilities: Not At Risk (03/21/2022)  Depression (PHQ2-9): Low Risk  (10/02/2019)  Tobacco Use: Medium Risk (03/21/2022)   SDOH Interventions:     Readmission Risk Interventions     No data to display

## 2022-03-22 NOTE — Progress Notes (Signed)
Progress Note   Patient: April Stevens [id number] DOB: [birth year] DOA: 03/21/2022     1 DOS: the patient was seen and examined on 03/22/2022   Brief hospital course: Patient with h/o [name], HTN, and diverticulitis with perforation in 2022) presenting with cough and SOB.  CXR showed pneumonia, treated with Rocephin and Azithromycin.  Persistent tachypnea and new O2 requirement.  Assessment and Plan:   Multifocal PNA -Patient presenting with productive cough, mildly decreased oxygen saturation, and multifocal infiltrates on chest x-ray -This appears to be most likely community-acquired pneumonia.  -Influenza negative. -COVID-19 negative. -CURB-65 score is 1 - will observe the patient on Med Surg (originally on cardiac monitoring but this is discontinued as it does not appear to be needed further). -Pneumonia Severity Index (PSI) is Class 2, <1% mortality. -Will start Azithromycin 500 mg IV daily and Rocephin due to no risk factors for MDR cause  -Will add albuterol PRN -Will add Mucinex for cough -Would recommend short interval follow-up imaging to document complete resolution  HTN -Continue chlorthalidone -She does have associated hypokalemia and her K+ will be repleted with ongoing use of KCl supplementation at the time of dc -Recheck BMP in AM  Depression -Continue fluoxetine  HLD -No longer taking atorvastatin (or other medication)  Urinary incontinence  -Continue oxybutynin  OSA -Continue CPAP     Subjective: Patient was feeling well this AM and was hopeful for the opportunity to discharge.  However, O2 sats are still down to 91% with ambulation and she felt some nausea and dizziness in the afternoon.  She is ok with staying one more night.    Physical Exam: Vitals:   03/22/22 0547 03/22/22 0600 03/22/22 1000 03/22/22 1337  BP: (!) 196/80 (!) 145/65  (!) 162/85  Pulse: 73   83  Resp: 17   18  Temp: 97.9 F (36.6 C)   99.3 F (37.4 C)  TempSrc: Oral    Oral  SpO2: 98%  93% 94%  Weight:      Height:       General:  Appears calm and comfortable and is in NAD Eyes:  EOMI, normal lids, iris ENT:  grossly normal hearing, lips & tongue, mmm Neck:  no LAD, masses or thyromegaly Cardiovascular:  RRR, no m/r/g. No LE edema.  Respiratory:   CTA bilaterally with no wheezes/rales/rhonchi.  Normal respiratory effort on [place] O2 this AM at the time of my evaluation. Abdomen:  soft, NT, ND Skin:  no rash or induration seen on limited exam Musculoskeletal:  grossly normal tone BUE/BLE, good ROM, no bony abnormality Psychiatric:  grossly normal mood and affect, speech fluent and appropriate, AOx3 Neurologic:  CN 2-12 grossly intact, moves all extremities in coordinated fashion   Radiological Exams on Admission: Independently reviewed - see discussion in A/P where applicable  DG Chest 2 View  Result Date: 03/21/2022 CLINICAL DATA:  Cough and fever EXAM: CHEST - 2 VIEW COMPARISON:  Chest x-ray January 08, 2021 FINDINGS: Bibasilar infiltrates, right greater than left. The heart, hila, mediastinum, lungs, and pleura are otherwise unremarkable. IMPRESSION: Bibasilar infiltrates concerning for pneumonia given history. Recommend short-term follow-up imaging after treatment to ensure resolution. Electronically Signed   By: Dorise Bullion III M.D.   On: 03/21/2022 11:28    EKG: none today   Labs on Admission: I have personally reviewed the available labs and imaging studies at the time of the admission.  Pertinent labs:    K+ 3.1 Glucose 123 Normal CBC  Family Communication: I attempted to call her sister without success.  The patient is capable of communicating with family at this time.  Disposition: Status is: Observation The patient remains OBS appropriate and will d/c before 2 midnights.  Planned Discharge Destination: Home   Time spent: 50 minutes  Author: Karmen Bongo, MD 03/22/2022 4:48 PM  For on call review [email or web address].

## 2022-03-22 NOTE — Plan of Care (Signed)

## 2022-03-22 NOTE — Progress Notes (Signed)
Pt refused cpap states that  is fine

## 2022-03-22 NOTE — Progress Notes (Signed)
Mobility Specialist - Progress Note   03/22/22 1200  Oxygen Therapy  O2 Device Room Air  Patient Activity (if Appropriate) Ambulating  Mobility  Activity Ambulated independently in hallway  Level of Assistance Independent  Assistive Device None  Distance Ambulated (ft) 500 ft  Activity Response Tolerated well  Mobility Referral Yes  $Mobility charge 1 Mobility   Nurse requested Mobility Specialist to perform oxygen saturation test with pt which includes removing pt from oxygen both at rest and while ambulating.  Below are the results from that testing.     Patient Saturations on Room Air at Rest = spO2 92%  Patient Saturations on Room Air while Ambulating = sp02 85% .  Rested and performed pursed lip breathing for 1 minute with sp02 at 93%.  Patient Saturations on 0 Liters of oxygen while Ambulating = sp02 85%  At end of testing pt left in room on 0  Liters of oxygen.  Reported results to nurse.    Pt received in bed and agreeable to mobility. Pt desat to 85% after ambulating ~28f while ambulating but stated she felt ok & chose not to wear oxygen. Pt to bed after session with all needs met.    Pre-mobility: 86 HR, 92% SpO2 (RA) During mobility: 82 HR, 85% SpO2 (RA) Post-mobility: 97 HR, 90% SPO2  MSet designer

## 2022-03-22 NOTE — Progress Notes (Signed)
Mobility Specialist - Progress Note   03/22/22 1610  Oxygen Therapy  O2 Device Room Air  Mobility  Activity Ambulated independently in hallway;Ambulated independently to bathroom  Level of Assistance Independent  Assistive Device None  Distance Ambulated (ft) 500 ft  Activity Response Tolerated well  Mobility Referral Yes  $Mobility charge 1 Mobility   Pt received in bed and agreeable to mobility. No complaints during session. Pt to bed after session with all needs met.    Pre-mobility: 84 HR, 90% SpO2 During mobility: 99 HR, 90% SpO2 Post-mobility: 71 HR, 93% SPO2  Set designer

## 2022-03-23 DIAGNOSIS — J189 Pneumonia, unspecified organism: Secondary | ICD-10-CM | POA: Diagnosis not present

## 2022-03-23 LAB — CBC WITH DIFFERENTIAL/PLATELET
Abs Immature Granulocytes: 0.01 10*3/uL (ref 0.00–0.07)
Basophils Absolute: 0.1 10*3/uL (ref 0.0–0.1)
Basophils Relative: 1 %
Eosinophils Absolute: 0.3 10*3/uL (ref 0.0–0.5)
Eosinophils Relative: 5 %
HCT: 41.1 % (ref 36.0–46.0)
Hemoglobin: 14 g/dL (ref 12.0–15.0)
Immature Granulocytes: 0 %
Lymphocytes Relative: 44 %
Lymphs Abs: 2.7 10*3/uL (ref 0.7–4.0)
MCH: 30 pg (ref 26.0–34.0)
MCHC: 34.1 g/dL (ref 30.0–36.0)
MCV: 88 fL (ref 80.0–100.0)
Monocytes Absolute: 0.5 10*3/uL (ref 0.1–1.0)
Monocytes Relative: 8 %
Neutro Abs: 2.5 10*3/uL (ref 1.7–7.7)
Neutrophils Relative %: 42 %
Platelets: 313 10*3/uL (ref 150–400)
RBC: 4.67 MIL/uL (ref 3.87–5.11)
RDW: 13.4 % (ref 11.5–15.5)
WBC: 6.1 10*3/uL (ref 4.0–10.5)
nRBC: 0 % (ref 0.0–0.2)

## 2022-03-23 LAB — BASIC METABOLIC PANEL
Anion gap: 8 (ref 5–15)
BUN: 8 mg/dL (ref 8–23)
CO2: 24 mmol/L (ref 22–32)
Calcium: 8.5 mg/dL — ABNORMAL LOW (ref 8.9–10.3)
Chloride: 104 mmol/L (ref 98–111)
Creatinine, Ser: 0.74 mg/dL (ref 0.44–1.00)
GFR, Estimated: >60 mL/min (ref >60)
Glucose, Bld: 132 mg/dL — ABNORMAL HIGH (ref 70–99)
Potassium: 3.6 mmol/L (ref 3.5–5.1)
Sodium: 136 mmol/L (ref 135–145)

## 2022-03-23 MED ORDER — AMOXICILLIN 875 MG PO TABS: 875.0000 mg | ORAL_TABLET | Freq: Two times a day (BID) | ORAL | 0 refills | Status: AC

## 2022-03-23 MED ORDER — GUAIFENESIN ER 600 MG PO TB12: 600.0000 mg | ORAL_TABLET | Freq: Two times a day (BID) | ORAL | 0 refills | Status: AC | PRN

## 2022-03-23 MED ORDER — ALBUTEROL SULFATE HFA 108 (90 BASE) MCG/ACT IN AERS: 2.0000 | INHALATION_SPRAY | Freq: Four times a day (QID) | RESPIRATORY_TRACT | 2 refills | Status: AC | PRN

## 2022-03-23 NOTE — Discharge Summary (Addendum)
Physician Discharge Summary   Patient: April Stevens MRN: [id number] DOB: [birth year]  Admit date:     03/21/2022  Discharge date: 03/23/22  Discharge Physician: Karmen Bongo   PCP: [name], [name], MD   Recommendations at discharge:   Take amoxicillin twice daily until it is gone Take Mucinex/use albuterol as needed for cough Follow up with PCP within 2 weeks; will need follow up chest x-ray to ensure resolution of infiltrate Glucose has been mildly elevated during hospitalization, needs follow up to assess for diabetes (has upcoming physical appointment scheduled) Needs follow up BMP  No longer on statin therapy, should discuss with PCP at upcoming physical appointment  Discharge Diagnoses: Principal Problem:   CAP (community acquired pneumonia) Active Problems:   HTN (hypertension)   Anxiety and depression   Obstructive sleep apnea treated with continuous positive airway pressure (CPAP)   Hypokalemia   HLD (hyperlipidemia)  Resolved Problems:   PNA (pneumonia)  Hospital Course: Patient with h/o [name], HTN, and diverticulitis with perforation in 2022) presenting with cough and SOB.  CXR showed pneumonia, treated with Rocephin and Azithromycin.  Persistent tachypnea and new O2 requirement.  Assessment and Plan: No notes have been filed under this hospital service. Service: Hospitalist  Multifocal PNA -Patient presenting with productive cough, mildly decreased oxygen saturation, and multifocal infiltrates on chest x-ray -This appears to be most likely community-acquired pneumonia.  -Influenza negative. -COVID-19 negative. -CURB-65 score is 1 - patient in observation status -Pneumonia Severity Index (PSI) is Class 2, <1% mortality. -Started Azithromycin 500 mg IV daily and Rocephin due to no risk factors for MDR cause -> transitioned to PO Amoxicillin 875 mg BID for 5 additional days -Will add albuterol PRN -Will add Mucinex for cough -Would recommend short  interval follow-up imaging to document complete resolution   HTN -Continue chlorthalidone -Continue KCl supplementation at the time of dc   Depression -Continue fluoxetine   HLD -No longer taking atorvastatin (or other medication)   Urinary incontinence  -Continue oxybutynin   OSA -Continue CPAP      Pain control - [place] Controlled Substance Reporting System database was reviewed. and patient was instructed, not to drive, operate heavy machinery, perform activities at heights, swimming or participation in water activities or provide baby-sitting services while on Pain, Sleep and Anxiety Medications; until their outpatient Physician has advised to do so again. Also recommended to not to take more than prescribed Pain, Sleep and Anxiety Medications.  Consultants: TOC team Procedures performed: None  Disposition: Home Diet recommendation:  Carb modified diet DISCHARGE MEDICATION: Allergies as of 03/23/2022   No Known Allergies      Medication List     STOP taking these medications    atorvastatin 20 MG tablet Commonly known as: LIPITOR       TAKE these medications    albuterol 108 (90 Base) MCG/ACT inhaler Commonly known as: VENTOLIN HFA Inhale 2 puffs into the lungs every 6 (six) hours as needed for wheezing or shortness of breath.   amoxicillin 875 MG tablet Commonly known as: AMOXIL Take 1 tablet (875 mg total) by mouth 2 (two) times daily for 5 days.   aspirin 81 MG tablet You can restart your baby aspirin 1 daily 09/28/2018. What changed:  how much to take how to take this when to take this additional instructions   B-12 PO Take 1 tablet by mouth daily at 2 PM.   chlorthalidone 25 MG tablet Commonly known as: HYGROTON Take 25  mg by mouth daily.   FLUoxetine 20 MG capsule Commonly known as: PROzac Take 3 capsules (60 mg total) by mouth daily. What changed: when to take this   guaiFENesin 600 MG 12 hr tablet Commonly known as:  MUCINEX Take 1 tablet (600 mg total) by mouth 2 (two) times daily as needed for cough or to loosen phlegm.   meloxicam 7.5 MG tablet Commonly known as: MOBIC Take 1 tablet by mouth daily as needed for pain.   oxybutynin 10 MG 24 hr tablet Commonly known as: DITROPAN-XL Take 10 mg by mouth daily.   potassium chloride SA 20 MEQ tablet Commonly known as: KLOR-CON M Take 20 mEq by mouth daily.        Discharge Exam: Filed Weights   03/21/22 1708  Weight: 86.5 kg    General:  Appears calm and comfortable and is in NAD, on RA currently Eyes:  EOMI, normal lids, iris ENT:  grossly normal hearing, lips & tongue, mmm Neck:  no LAD, masses or thyromegaly Cardiovascular:  RRR, no m/r/g. No LE edema.  Respiratory:   CTA bilaterally with no wheezes/rales/rhonchi.  Normal respiratory effort on room air Abdomen:  soft, NT, ND Skin:  no rash or induration seen on limited exam Musculoskeletal:  grossly normal tone BUE/BLE, good ROM, no bony abnormality Psychiatric:  grossly normal mood and affect, speech fluent and appropriate, AOx3 Neurologic:  CN 2-12 grossly intact, moves all extremities in coordinated fashion   Radiological Exams on Admission: Independently reviewed - see discussion in A/P where applicable  DG Chest 2 View  Result Date: 03/21/2022 CLINICAL DATA:  Cough and fever EXAM: CHEST - 2 VIEW COMPARISON:  Chest x-ray January 08, 2021 FINDINGS: Bibasilar infiltrates, right greater than left. The heart, hila, mediastinum, lungs, and pleura are otherwise unremarkable. IMPRESSION: Bibasilar infiltrates concerning for pneumonia given history. Recommend short-term follow-up imaging after treatment to ensure resolution. Electronically Signed   By: Dorise Bullion III M.D.   On: 03/21/2022 11:28    EKG: not done   Labs on Admission: I have personally reviewed the available labs and imaging studies at the time of the admission.  Pertinent labs:    Glucose 132 Normal  CBC  Condition at discharge: improving  The results of significant diagnostics from this hospitalization (including imaging, microbiology, ancillary and laboratory) are listed below for reference.   Imaging Studies: DG Chest 2 View  Result Date: 03/21/2022 CLINICAL DATA:  Cough and fever EXAM: CHEST - 2 VIEW COMPARISON:  Chest x-ray January 08, 2021 FINDINGS: Bibasilar infiltrates, right greater than left. The heart, hila, mediastinum, lungs, and pleura are otherwise unremarkable. IMPRESSION: Bibasilar infiltrates concerning for pneumonia given history. Recommend short-term follow-up imaging after treatment to ensure resolution. Electronically Signed   By: Dorise Bullion III M.D.   On: 03/21/2022 11:28    Microbiology: Results for orders placed or performed during the hospital encounter of 03/21/22  Resp panel by RT-PCR (RSV, Flu A&B, Covid) Anterior Nasal Swab     Status: None   Collection Time: 03/21/22 11:16 AM   Specimen: Anterior Nasal Swab  Result Value Ref Range Status   SARS Coronavirus 2 by RT PCR NEGATIVE NEGATIVE Final    Comment: (NOTE) SARS-CoV-2 target nucleic acids are NOT DETECTED.  The SARS-CoV-2 RNA is generally detectable in upper respiratory specimens during the acute phase of infection. The lowest concentration of SARS-CoV-2 viral copies this assay can detect is 138 copies/mL. A negative result does not preclude SARS-Cov-2 infection and should  not be used as the sole basis for treatment or other patient management decisions. A negative result may occur with  improper specimen collection/handling, submission of specimen other than nasopharyngeal swab, presence of viral mutation(s) within the areas targeted by this assay, and inadequate number of viral copies(<138 copies/mL). A negative result must be combined with clinical observations, patient history, and epidemiological information. The expected result is Negative.  Fact Sheet for Patients:   EntrepreneurPulse.com.au  Fact Sheet for Healthcare Providers:  IncredibleEmployment.be  This test is no t yet approved or cleared by the [place] FDA and  has been authorized for detection and/or diagnosis of SARS-CoV-2 by FDA under an Emergency Use Authorization (EUA). This EUA will remain  in effect (meaning this test can be used) for the duration of the COVID-19 declaration under Section 564(b)(1) of the Act, 21 U.S.C.section 360bbb-3(b)(1), unless the authorization is terminated  or revoked sooner.       Influenza A by PCR NEGATIVE NEGATIVE Final   Influenza B by PCR NEGATIVE NEGATIVE Final    Comment: (NOTE) The Xpert Xpress SARS-CoV-2/FLU/RSV plus assay is intended as an aid in the diagnosis of influenza from Nasopharyngeal swab specimens and should not be used as a sole basis for treatment. Nasal washings and aspirates are unacceptable for Xpert Xpress SARS-CoV-2/FLU/RSV testing.  Fact Sheet for Patients: EntrepreneurPulse.com.au  Fact Sheet for Healthcare Providers: IncredibleEmployment.be  This test is not yet approved or cleared by the [place] FDA and has been authorized for detection and/or diagnosis of SARS-CoV-2 by FDA under an Emergency Use Authorization (EUA). This EUA will remain in effect (meaning this test can be used) for the duration of the COVID-19 declaration under Section 564(b)(1) of the Act, 21 U.S.C. section 360bbb-3(b)(1), unless the authorization is terminated or revoked.     Resp Syncytial Virus by PCR NEGATIVE NEGATIVE Final    Comment: (NOTE) Fact Sheet for Patients: EntrepreneurPulse.com.au  Fact Sheet for Healthcare Providers: IncredibleEmployment.be  This test is not yet approved or cleared by the [place] FDA and has been authorized for detection and/or diagnosis of SARS-CoV-2 by FDA under an Emergency Use  Authorization (EUA). This EUA will remain in effect (meaning this test can be used) for the duration of the COVID-19 declaration under Section 564(b)(1) of the Act, 21 U.S.C. section 360bbb-3(b)(1), unless the authorization is terminated or revoked.  Performed at [facility], [address] [place], [place] [address]       Discharge time spent: greater than 30 minutes.  Signed: Karmen Bongo, MD [facility] 03/23/2022

## 2022-05-06 NOTE — Progress Notes (Signed)
PATIENT: April Stevens DOB: September 16, 1955  REASON FOR VISIT: follow up HISTORY FROM: patient PRIMARY NEUROLOGIST: Dr. Vickey Huger  Chief Complaint  Patient presents with   Follow-up    Pt in 19  Pt here for CPAP f/u Pt states trying to lose weight Pt had Pneumonia March 2024 . Admitted in [facility] for 3 days      HISTORY OF PRESENT ILLNESS: Today 05/10/22:  April Stevens is a 67 y.o. female with a history of [name]. Returns today for follow-up.  Reports that the CPAP is working well.  She states that she is trying to lose weight in the hopes of getting off of the CPAP eventually.  She is seeing a weight loss specialist in May.       05/06/21: April Stevens is a 67 year old female with a history of obstructive sleep apnea on CPAP.  She returns today for follow-up.  She reports that the CPAP is working well.  Her only issue is that she is having to pay for CPAP supplies.   REVIEW OF SYSTEMS: Out of a complete 14 system review of symptoms, the patient complains only of the following symptoms, and all other reviewed systems are negative.  FSS 10 ESS 6  ALLERGIES: No Known Allergies  HOME MEDICATIONS: Outpatient Medications Prior to Visit  Medication Sig Dispense Refill   albuterol (VENTOLIN HFA) 108 (90 Base) MCG/ACT inhaler Inhale 2 puffs into the lungs every 6 (six) hours as needed for wheezing or shortness of breath. 8 g 2   aspirin 81 MG tablet You can restart your baby aspirin 1 daily 09/28/2018. (Patient taking differently: Take 81 mg by mouth at bedtime.) 365 tablet 0   chlorthalidone (HYGROTON) 25 MG tablet Take 25 mg by mouth daily.     Cyanocobalamin (B-12 PO) Take 1 tablet by mouth daily at 2 PM.     FLUoxetine (PROZAC) 20 MG capsule Take 3 capsules (60 mg total) by mouth daily. (Patient taking differently: Take 60 mg by mouth at bedtime.) 270 capsule 1   guaiFENesin (MUCINEX) 600 MG 12 hr tablet Take 1 tablet (600 mg total) by mouth 2 (two) times daily as needed for cough or  to loosen phlegm. 30 tablet 0   meloxicam (MOBIC) 7.5 MG tablet Take 1 tablet by mouth daily as needed for pain.     oxybutynin (DITROPAN-XL) 10 MG 24 hr tablet Take 10 mg by mouth daily.     potassium chloride SA (KLOR-CON M) 20 MEQ tablet Take 20 mEq by mouth daily.     No facility-administered medications prior to visit.    PAST MEDICAL HISTORY: Past Medical History:  Diagnosis Date   Depression    Hypertension    [name]     PAST SURGICAL HISTORY: Past Surgical History:  Procedure Laterality Date   APPENDECTOMY  09/26/2018   HYSTERECTOMY ABDOMINAL WITH SALPINGO-OOPHORECTOMY  2000   DUB   JOINT REPLACEMENT     LAPAROSCOPIC APPENDECTOMY N/A 09/26/2018   Procedure: APPENDECTOMY LAPAROSCOPIC;  Surgeon: Kinsinger, De Blanch, MD;  Location: [facility] OR;  Service: General;  Laterality: N/A;   OPEN REDUCTION INTERNAL FIXATION (ORIF) DISTAL RADIAL FRACTURE Left 08/17/2021   Procedure: OPEN REDUCTION INTERNAL FIXATION (ORIF) LEFT DISTAL RADIAL FRACTURE;  Surgeon: Marlyne Beards, MD;  Location: [facility];  Service: Orthopedics;  Laterality: Left;   SHOULDER SURGERY Left    01-02-2018, has had R shoulder rotator cuff previously    FAMILY HISTORY: Family History  Problem Relation Age of Onset   Hypertension Father    Heart attack Father    Sleep apnea Neg Hx     SOCIAL HISTORY: Social History   Socioeconomic History   Marital status: Single    Spouse name: Not on file   Number of children: Not on file   Years of education: Not on file   Highest education level: Not on file  Occupational History   Not on file  Tobacco Use   Smoking status: Former    Packs/day: 3.00    Years: 20.00    Additional pack years: 0.00    Total pack years: 60.00    Types: Cigarettes    Quit date: 2000    Years since quitting: 24.3   Smokeless tobacco: Never  Vaping Use   Vaping Use: Never used  Substance and Sexual Activity   Alcohol use: Yes    Comment: socially   Drug  use: No   Sexual activity: Yes  Other Topics Concern   Not on file  Social History Narrative   Not on file   Social Determinants of Health   Financial Resource Strain: Not on file  Food Insecurity: No Food Insecurity (03/21/2022)   Hunger Vital Sign    Worried About Running Out of Food in the Last Year: Never true    Ran Out of Food in the Last Year: Never true  Transportation Needs: No Transportation Needs (03/21/2022)   PRAPARE - Administrator, Civil Service (Medical): No    Lack of Transportation (Non-Medical): No  Physical Activity: Not on file  Stress: Not on file  Social Connections: Not on file  Intimate Partner Violence: Not At Risk (03/21/2022)   Humiliation, Afraid, Rape, and Kick questionnaire    Fear of Current or Ex-Partner: No    Emotionally Abused: No    Physically Abused: No    Sexually Abused: No      PHYSICAL EXAM  Vitals:   05/10/22 0830  BP: (!) 149/76  Pulse: 79  Weight: 191 lb 6.4 oz (86.8 kg)  Height:  (1.549 m)    Body mass index is 36.16 kg/m.  Generalized: Well developed, in no acute distress  Chest: Lungs clear to auscultation bilaterally  Neurological examination  Mentation: Alert oriented to time, place, history taking. Follows all commands speech and language fluent Cranial nerve II-XII: Facial symmetry noted   DIAGNOSTIC DATA (LABS, IMAGING, TESTING) - I reviewed patient records, labs, notes, testing and imaging myself where available.  Lab Results  Component Value Date   WBC 6.1 03/23/2022   HGB 14.0 03/23/2022   HCT 41.1 03/23/2022   MCV 88.0 03/23/2022   PLT 313 03/23/2022      Component Value Date/Time   NA 136 03/23/2022 0532   NA 139 06/29/2019 1157   K 3.6 03/23/2022 0532   CL 104 03/23/2022 0532   CO2 24 03/23/2022 0532   GLUCOSE 132 (H) 03/23/2022 0532   BUN 8 03/23/2022 0532   BUN 12 06/29/2019 1157   CREATININE 0.74 03/23/2022 0532   CREATININE 0.89 03/14/2014 1146   CALCIUM 8.5 (L)  03/23/2022 0532   PROT 7.0 02/08/2020 0049   PROT 7.2 06/29/2019 1157   ALBUMIN 3.7 02/08/2020 0049   ALBUMIN 4.3 06/29/2019 1157   AST 19 02/08/2020 0049   ALT 16 02/08/2020 0049   ALKPHOS 88 02/08/2020 0049   BILITOT 1.2 02/08/2020 0049   BILITOT 0.7 06/29/2019 1157   GFRNONAA >60 03/23/2022  0532   GFRNONAA 71 03/14/2014 1146   GFRAA 78 06/29/2019 1157   GFRAA 81 03/14/2014 1146   Lab Results  Component Value Date   CHOL 205 (H) 06/29/2019   HDL 45 06/29/2019   LDLCALC 110 (H) 06/29/2019   TRIG 292 (H) 06/29/2019   CHOLHDL 4.6 (H) 06/29/2019   Lab Results  Component Value Date   HGBA1C 6.1 (H) 03/27/2019   No results found for: "VITAMINB12" Lab Results  Component Value Date   TSH 2.170 03/27/2019      ASSESSMENT AND PLAN 67 y.o. year old female  has a past medical history of Depression, Hypertension, and [name]. here with:  [name]  - CPAP compliance excellent - Good treatment of AHI  - Encourage patient to use CPAP nightly and > 4 hours each night - F/U in 1 year or sooner if needed     Butch Penny, MSN, NP-C 05/10/2022, 8:25 AM [facility] [address] [place], [place] [address] [phone]

## 2022-05-10 ENCOUNTER — Ambulatory Visit: Payer: Medicare PPO | Admitting: Adult Health

## 2022-05-10 ENCOUNTER — Encounter: Payer: Self-pay | Admitting: Adult Health

## 2022-05-10 VITALS — BP 149/76 | HR 79 | Ht 61.0 in | Wt 191.4 lb

## 2022-05-10 DIAGNOSIS — G4733 Obstructive sleep apnea (adult) (pediatric): Secondary | ICD-10-CM | POA: Diagnosis not present

## 2022-08-03 ENCOUNTER — Other Ambulatory Visit (HOSPITAL_BASED_OUTPATIENT_CLINIC_OR_DEPARTMENT_OTHER): Payer: Self-pay

## 2022-08-03 MED ORDER — SEMAGLUTIDE-WEIGHT MANAGEMENT 1 MG/0.5ML ~~LOC~~ SOAJ
1.0000 mg | SUBCUTANEOUS | 0 refills | Status: AC
  Filled 2022-08-03: qty 2, 30d supply, fill #0
  Filled 2022-08-03: qty 2, 28d supply, fill #0

## 2022-08-16 ENCOUNTER — Other Ambulatory Visit (HOSPITAL_BASED_OUTPATIENT_CLINIC_OR_DEPARTMENT_OTHER): Payer: Self-pay

## 2022-08-16 MED ORDER — WEGOVY 1.7 MG/0.75ML ~~LOC~~ SOAJ
1.7000 mg | SUBCUTANEOUS | 0 refills | Status: DC
  Filled 2022-08-16 – 2022-08-27 (×2): qty 3, 28d supply, fill #0

## 2022-08-23 ENCOUNTER — Other Ambulatory Visit (HOSPITAL_BASED_OUTPATIENT_CLINIC_OR_DEPARTMENT_OTHER): Payer: Self-pay

## 2022-08-27 ENCOUNTER — Other Ambulatory Visit (HOSPITAL_BASED_OUTPATIENT_CLINIC_OR_DEPARTMENT_OTHER): Payer: Self-pay

## 2022-09-27 ENCOUNTER — Other Ambulatory Visit (HOSPITAL_BASED_OUTPATIENT_CLINIC_OR_DEPARTMENT_OTHER): Payer: Self-pay

## 2022-09-27 MED ORDER — WEGOVY 2.4 MG/0.75ML ~~LOC~~ SOAJ
2.4000 mg | SUBCUTANEOUS | 0 refills | Status: DC
  Filled 2022-09-27: qty 3, 28d supply, fill #0

## 2022-09-28 ENCOUNTER — Other Ambulatory Visit (HOSPITAL_BASED_OUTPATIENT_CLINIC_OR_DEPARTMENT_OTHER): Payer: Self-pay

## 2023-02-07 ENCOUNTER — Encounter (HOSPITAL_BASED_OUTPATIENT_CLINIC_OR_DEPARTMENT_OTHER): Payer: Self-pay

## 2023-02-07 ENCOUNTER — Other Ambulatory Visit (HOSPITAL_BASED_OUTPATIENT_CLINIC_OR_DEPARTMENT_OTHER): Payer: Self-pay

## 2023-02-07 MED ORDER — WEGOVY 0.25 MG/0.5ML ~~LOC~~ SOAJ
0.2500 mg | SUBCUTANEOUS | 0 refills | Status: AC
  Filled 2023-02-07: qty 2, 28d supply, fill #0

## 2023-02-09 ENCOUNTER — Other Ambulatory Visit (HOSPITAL_BASED_OUTPATIENT_CLINIC_OR_DEPARTMENT_OTHER): Payer: Self-pay

## 2023-02-10 ENCOUNTER — Other Ambulatory Visit (HOSPITAL_BASED_OUTPATIENT_CLINIC_OR_DEPARTMENT_OTHER): Payer: Self-pay

## 2023-03-07 ENCOUNTER — Other Ambulatory Visit (HOSPITAL_BASED_OUTPATIENT_CLINIC_OR_DEPARTMENT_OTHER): Payer: Self-pay

## 2023-03-07 MED ORDER — WEGOVY 0.25 MG/0.5ML ~~LOC~~ SOAJ
0.2500 mg | SUBCUTANEOUS | 0 refills | Status: AC
  Filled 2023-03-07: qty 2, 28d supply, fill #0

## 2023-04-04 ENCOUNTER — Encounter (HOSPITAL_BASED_OUTPATIENT_CLINIC_OR_DEPARTMENT_OTHER): Payer: Self-pay

## 2023-04-04 ENCOUNTER — Other Ambulatory Visit (HOSPITAL_BASED_OUTPATIENT_CLINIC_OR_DEPARTMENT_OTHER): Payer: Self-pay

## 2023-04-04 MED ORDER — PHENTERMINE HCL 37.5 MG PO TABS
37.5000 mg | ORAL_TABLET | Freq: Every day | ORAL | 0 refills | Status: AC
  Filled 2023-04-04: qty 15, 15d supply, fill #0

## 2023-04-04 MED ORDER — WEGOVY 0.25 MG/0.5ML ~~LOC~~ SOAJ
0.2500 mg | SUBCUTANEOUS | 0 refills | Status: AC
  Filled 2023-04-04: qty 2, 28d supply, fill #0

## 2023-04-06 ENCOUNTER — Other Ambulatory Visit (HOSPITAL_BASED_OUTPATIENT_CLINIC_OR_DEPARTMENT_OTHER): Payer: Self-pay

## 2023-04-14 ENCOUNTER — Other Ambulatory Visit (HOSPITAL_BASED_OUTPATIENT_CLINIC_OR_DEPARTMENT_OTHER): Payer: Self-pay

## 2023-05-16 NOTE — Progress Notes (Deleted)
 April Stevens

## 2023-05-17 ENCOUNTER — Ambulatory Visit: Payer: Medicare PPO | Admitting: Adult Health

## 2023-05-17 ENCOUNTER — Encounter: Payer: Self-pay | Admitting: Adult Health

## 2023-05-17 ENCOUNTER — Telehealth: Payer: Self-pay | Admitting: Adult Health

## 2023-05-17 NOTE — Progress Notes (Deleted)
 PATIENT: April Stevens DOB: 1955/03/30  REASON FOR VISIT: follow up HISTORY FROM: patient PRIMARY NEUROLOGIST: Dr. Albertina Hugger   No chief complaint on file.    HISTORY OF PRESENT ILLNESS: Today 05/17/23:  April Stevens is a 68 y.o. female with a history of [name]. Returns today for follow-up.      05/10/22:   April Stevens is a 68 y.o. female with a history of [name]. Returns today for follow-up.  Reports that the CPAP is working well.  She states that she is trying to lose weight in the hopes of getting off of the CPAP eventually.  She is seeing a weight loss specialist in May.   REVIEW OF SYSTEMS: Out of a complete 14 system review of symptoms, the patient complains only of the following symptoms, and all other reviewed systems are negative.  FSS ESS  ALLERGIES: No Known Allergies  HOME MEDICATIONS: Outpatient Medications Prior to Visit  Medication Sig Dispense Refill   albuterol  (VENTOLIN  HFA) 108 (90 Base) MCG/ACT inhaler Inhale 2 puffs into the lungs every 6 (six) hours as needed for wheezing or shortness of breath. 8 g 2   aspirin  81 MG tablet You can restart your baby aspirin  1 daily 09/28/2018. (Patient taking differently: Take 81 mg by mouth at bedtime.) 365 tablet 0   chlorthalidone  (HYGROTON ) 25 MG tablet Take 25 mg by mouth daily.     Cyanocobalamin  (B-12 PO) Take 1 tablet by mouth daily at 2 PM.     FLUoxetine  (PROZAC ) 20 MG capsule Take 3 capsules (60 mg total) by mouth daily. (Patient taking differently: Take 60 mg by mouth at bedtime.) 270 capsule 1   guaiFENesin  (MUCINEX ) 600 MG 12 hr tablet Take 1 tablet (600 mg total) by mouth 2 (two) times daily as needed for cough or to loosen phlegm. 30 tablet 0   meloxicam  (MOBIC ) 7.5 MG tablet Take 1 tablet by mouth daily as needed for pain.     oxybutynin  (DITROPAN -XL) 10 MG 24 hr tablet Take 10 mg by mouth daily.     phentermine  (ADIPEX-P ) 37.5 MG tablet Take 1 tablet (37.5 mg total) by mouth daily 30 minutes  before breakfast. 15 tablet 0   potassium chloride  SA (KLOR-CON  M) 20 MEQ tablet Take 20 mEq by mouth daily.     Semaglutide -Weight Management 1 MG/0.5ML SOAJ Inject 1 mg as directed once a week. 2 mL 0   WEGOVY  0.25 MG/0.5ML SOAJ Inject 0.25 mg into the skin once a week at 0900 for 4 doses 2 mL 0   WEGOVY  0.25 MG/0.5ML SOAJ Inject 0.25 mg into the skin once a week at 9 am 2 mL 0   WEGOVY  0.25 MG/0.5ML SOAJ Inject 0.25 mg into the skin once a week. 2 mL 0   No facility-administered medications prior to visit.    PAST MEDICAL HISTORY: Past Medical History:  Diagnosis Date   Depression    Hypertension    [name]     PAST SURGICAL HISTORY: Past Surgical History:  Procedure Laterality Date   APPENDECTOMY  09/26/2018   HYSTERECTOMY ABDOMINAL WITH SALPINGO-OOPHORECTOMY  2000   DUB   JOINT REPLACEMENT     LAPAROSCOPIC APPENDECTOMY N/A 09/26/2018   Procedure: APPENDECTOMY LAPAROSCOPIC;  Surgeon: Kinsinger, Alphonso Aschoff, MD;  Location: [facility] OR;  Service: General;  Laterality: N/A;   OPEN REDUCTION INTERNAL FIXATION (ORIF) DISTAL RADIAL FRACTURE Left 08/17/2021   Procedure: OPEN REDUCTION INTERNAL FIXATION (ORIF) LEFT DISTAL RADIAL  FRACTURE;  Surgeon: Marilyn Shropshire, MD;  Location: [facility];  Service: Orthopedics;  Laterality: Left;   SHOULDER SURGERY Left    01-02-2018, has had R shoulder rotator cuff previously    FAMILY HISTORY: Family History  Problem Relation Age of Onset   Hypertension Father    Heart attack Father    Sleep apnea Neg Hx     SOCIAL HISTORY: Social History   Socioeconomic History   Marital status: Single    Spouse name: Not on file   Number of children: Not on file   Years of education: Not on file   Highest education level: Not on file  Occupational History   Not on file  Tobacco Use   Smoking status: Former    Current packs/day: 0.00    Average packs/day: 3.0 packs/day for 20.0 years (60.0 ttl pk-yrs)    Types: Cigarettes    Start  date: 10    Quit date: 2000    Years since quitting: 25.3   Smokeless tobacco: Never  Vaping Use   Vaping status: Never Used  Substance and Sexual Activity   Alcohol use: Yes    Comment: socially   Drug use: No   Sexual activity: Yes  Other Topics Concern   Not on file  Social History Narrative   Not on file   Social Drivers of Health   Financial Resource Strain: Low Risk  (09/21/2022)   Received from [facility]   Overall Financial Resource Strain (CARDIA)    Difficulty of Paying Living Expenses: Not hard at all  Food Insecurity: No Food Insecurity (09/21/2022)   Received from [facility]   Hunger Vital Sign    Worried About Running Out of Food in the Last Year: Never true    Ran Out of Food in the Last Year: Never true  Transportation Needs: No Transportation Needs (09/21/2022)   Received from [facility] - Transportation    Lack of Transportation (Medical): No    Lack of Transportation (Non-Medical): No  Physical Activity: Sufficiently Active (09/21/2022)   Received from [facility]   Exercise Vital Sign    Days of Exercise per Week: 7 days    Minutes of Exercise per Session: 30 min  Stress: No Stress Concern Present (09/21/2022)   Received from [facility] - Occupational Stress Questionnaire    Feeling of Stress : Only a little  Social Connections: Moderately Integrated (09/21/2022)   Received from [facility]   Social Network    How would you rate your social network (family, work, friends)?: Adequate participation with social networks  Intimate Partner Violence: Not At Risk (09/21/2022)   Received from [facility]   HITS    Over the last 12 months how often did your partner physically hurt you?: Never    Over the last 12 months how often did your partner insult you or talk down to you?: Never    Over the last 12 months how often did your partner threaten you with physical harm?: Never    Over the last 12  months how often did your partner scream or curse at you?: Never      PHYSICAL EXAM  There were no vitals filed for this visit. There is no height or weight on file to calculate BMI.  Generalized: Well developed, in no acute distress  Chest: Lungs clear to auscultation bilaterally  Neurological examination  Mentation: Alert oriented to time,  place, history taking. Follows all commands speech and language fluent Cranial nerve II-XII: Extraocular movements were full, visual field were full on confrontational test Head turning and shoulder shrug  were normal and symmetric. Motor: The motor testing reveals 5 over 5 strength of all 4 extremities. Good symmetric motor tone is noted throughout.  Sensory: Sensory testing is intact to soft touch on all 4 extremities. No evidence of extinction is noted.  Gait and station: Gait is normal.    DIAGNOSTIC DATA (LABS, IMAGING, TESTING) - I reviewed patient records, labs, notes, testing and imaging myself where available.  Lab Results  Component Value Date   WBC 6.1 03/23/2022   HGB 14.0 03/23/2022   HCT 41.1 03/23/2022   MCV 88.0 03/23/2022   PLT 313 03/23/2022      Component Value Date/Time   NA 136 03/23/2022 0532   NA 139 06/29/2019 1157   K 3.6 03/23/2022 0532   CL 104 03/23/2022 0532   CO2 24 03/23/2022 0532   GLUCOSE 132 (H) 03/23/2022 0532   BUN 8 03/23/2022 0532   BUN 12 06/29/2019 1157   CREATININE 0.74 03/23/2022 0532   CREATININE 0.89 03/14/2014 1146   CALCIUM  8.5 (L) 03/23/2022 0532   PROT 7.0 02/08/2020 0049   PROT 7.2 06/29/2019 1157   ALBUMIN 3.7 02/08/2020 0049   ALBUMIN 4.3 06/29/2019 1157   AST 19 02/08/2020 0049   ALT 16 02/08/2020 0049   ALKPHOS 88 02/08/2020 0049   BILITOT 1.2 02/08/2020 0049   BILITOT 0.7 06/29/2019 1157   GFRNONAA >60 03/23/2022 0532   GFRNONAA 71 03/14/2014 1146   GFRAA 78 06/29/2019 1157   GFRAA 81 03/14/2014 1146   Lab Results  Component Value Date   CHOL 205 (H) 06/29/2019    HDL 45 06/29/2019   LDLCALC 110 (H) 06/29/2019   TRIG 292 (H) 06/29/2019   CHOLHDL 4.6 (H) 06/29/2019   Lab Results  Component Value Date   HGBA1C 6.1 (H) 03/27/2019   No results found for: "VITAMINB12" Lab Results  Component Value Date   TSH 2.170 03/27/2019      ASSESSMENT AND PLAN 67 y.o. year old female  has a past medical history of Depression, Hypertension, and [name]. here with:  [name]  - CPAP compliance excellent - Good treatment of AHI  - Encourage patient to use CPAP nightly and > 4 hours each night - F/U in 1 year or sooner if needed    Clem Currier, MSN, NP-C 05/17/2023, 8:25 AM [facility] [address], [address] [place], [place] [address] [phone]

## 2023-05-17 NOTE — Telephone Encounter (Signed)
 Appointment made

## 2023-05-21 ENCOUNTER — Emergency Department (HOSPITAL_BASED_OUTPATIENT_CLINIC_OR_DEPARTMENT_OTHER)
Admission: EM | Admit: 2023-05-21 | Discharge: 2023-05-21 | Disposition: A | Discharge status: Home / Self Care | Payer: Self-pay | Attending: Emergency Medicine | Admitting: Emergency Medicine

## 2023-05-21 ENCOUNTER — Encounter (HOSPITAL_BASED_OUTPATIENT_CLINIC_OR_DEPARTMENT_OTHER): Payer: Self-pay

## 2023-05-21 ENCOUNTER — Emergency Department (HOSPITAL_BASED_OUTPATIENT_CLINIC_OR_DEPARTMENT_OTHER): Payer: Self-pay

## 2023-05-21 DIAGNOSIS — I10 Essential (primary) hypertension: Secondary | ICD-10-CM | POA: Insufficient documentation

## 2023-05-21 DIAGNOSIS — Z7982 Long term (current) use of aspirin: Secondary | ICD-10-CM | POA: Diagnosis not present

## 2023-05-21 DIAGNOSIS — R1032 Left lower quadrant pain: Secondary | ICD-10-CM | POA: Insufficient documentation

## 2023-05-21 DIAGNOSIS — Z79899 Other long term (current) drug therapy: Secondary | ICD-10-CM | POA: Insufficient documentation

## 2023-05-21 DIAGNOSIS — Z87891 Personal history of nicotine dependence: Secondary | ICD-10-CM | POA: Diagnosis not present

## 2023-05-21 DIAGNOSIS — R109 Unspecified abdominal pain: Secondary | ICD-10-CM

## 2023-05-21 LAB — COMPREHENSIVE METABOLIC PANEL WITH GFR
ALT: 12 U/L (ref 0–44)
AST: 19 U/L (ref 15–41)
Albumin: 4.3 g/dL (ref 3.5–5.0)
Alkaline Phosphatase: 100 U/L (ref 38–126)
Anion gap: 12 (ref 5–15)
BUN: 15 mg/dL (ref 8–23)
CO2: 27 mmol/L (ref 22–32)
Calcium: 9.8 mg/dL (ref 8.9–10.3)
Chloride: 101 mmol/L (ref 98–111)
Creatinine, Ser: 0.92 mg/dL (ref 0.44–1.00)
GFR, Estimated: >60 mL/min (ref >60)
Glucose, Bld: 113 mg/dL — ABNORMAL HIGH (ref 70–99)
Potassium: 3.6 mmol/L (ref 3.5–5.1)
Sodium: 140 mmol/L (ref 135–145)
Total Bilirubin: 0.8 mg/dL (ref 0.0–1.2)
Total Protein: 7 g/dL (ref 6.5–8.1)

## 2023-05-21 LAB — CBC
HCT: 36.3 % (ref 36.0–46.0)
Hemoglobin: 13.1 g/dL (ref 12.0–15.0)
MCH: 30.3 pg (ref 26.0–34.0)
MCHC: 36.1 g/dL — ABNORMAL HIGH (ref 30.0–36.0)
MCV: 84 fL (ref 80.0–100.0)
Platelets: 321 10*3/uL (ref 150–400)
RBC: 4.32 MIL/uL (ref 3.87–5.11)
RDW: 13.4 % (ref 11.5–15.5)
WBC: 8.2 10*3/uL (ref 4.0–10.5)
nRBC: 0 % (ref 0.0–0.2)

## 2023-05-21 LAB — URINALYSIS, ROUTINE W REFLEX MICROSCOPIC
Bacteria, UA: NONE SEEN
Bilirubin Urine: NEGATIVE
Glucose, UA: NEGATIVE mg/dL
Ketones, ur: NEGATIVE mg/dL
Leukocytes,Ua: NEGATIVE
Nitrite: NEGATIVE
Protein, ur: NEGATIVE mg/dL
Specific Gravity, Urine: 1.021 (ref 1.005–1.030)
pH: 5.5 (ref 5.0–8.0)

## 2023-05-21 MED ORDER — IOHEXOL 300 MG/ML  SOLN
100.0000 mL | Freq: Once | INTRAMUSCULAR | Status: AC | PRN
  Administered 2023-05-21: 100 mL via INTRAVENOUS

## 2023-05-21 MED ORDER — METHYLPREDNISOLONE 4 MG PO TBPK: ORAL_TABLET | ORAL | 0 refills | Status: AC

## 2023-05-21 MED ORDER — HYDROCODONE-ACETAMINOPHEN 5-325 MG PO TABS
1.0000 | ORAL_TABLET | Freq: Once | ORAL | Status: AC
  Administered 2023-05-21: 1 via ORAL
  Filled 2023-05-21: qty 1

## 2023-05-21 NOTE — ED Triage Notes (Signed)
 Onset two weeks of left flank pain radiating into left hip.  States was having urination problems and started on medication and this has resolved.

## 2023-05-21 NOTE — Discharge Instructions (Signed)
 As discussed, CT scan of your abdomen and pelvis showed nothing acutely abnormal.  You do have severe degenerative disc disease in your low back which could be causing radicular pain.  Will trial a Medrol  Dosepak in the outpatient setting to see if you get relief.  You may take Tylenol  on top of this as well.  Recommend close follow-up with your primary care for reassessment.  Please not hesitate to return if the worrisome signs and symptoms we discussed become apparent.

## 2023-05-21 NOTE — ED Notes (Signed)
 AVS given.Health teaching done.All questions answered satisfactorily.Discharged ambulatory.

## 2023-05-21 NOTE — ED Provider Notes (Signed)
 [place] EMERGENCY DEPARTMENT AT [facility] Provider Note   CSN: [id number] Arrival date & time: 05/21/23  2952     History  No chief complaint on file.   April Stevens is a 68 y.o. female.  HPI   68 year old female left flank pain.  Has been having symptoms for the past 3 weeks.  New medication in the form of oxybutynin  was began around a week ago.  Reports left-sided flank pain with some radiation to her left hip.  Symptoms worsened with certain positions/movement.  Denies any known trauma/injury.  Pain has been constant since onset.  Denies any fever, chills, nausea, vomiting, dysuria, hematuria, change in bowel habits.  Denies history of similar symptoms in the past.  Has tried over-the-counter medications for pain with some improvement.  Denies any saddle anesthesia, weakness/sensory deficits in lower extremities, fever, history of IV drug use, prolonged corticosteroid use, known malignancy.  Past medical history significant for htn, depression, osa, hld  Home Medications Prior to Admission medications   Medication Sig Start Date End Date Taking? Authorizing Provider  albuterol  (VENTOLIN  HFA) 108 (90 Base) MCG/ACT inhaler Inhale 2 puffs into the lungs every 6 (six) hours as needed for wheezing or shortness of breath. 03/23/22   Lorita Rosa, MD  aspirin  81 MG tablet You can restart your baby aspirin  1 daily 09/28/2018. Patient taking differently: Take 81 mg by mouth at bedtime. 09/26/18   Monetta Angst, PA-C  chlorthalidone  (HYGROTON ) 25 MG tablet Take 25 mg by mouth daily. 03/22/20   [provider]  Cyanocobalamin  (B-12 PO) Take 1 tablet by mouth daily at 2 PM.    [provider]  FLUoxetine  (PROZAC ) 20 MG capsule Take 3 capsules (60 mg total) by mouth daily. Patient taking differently: Take 60 mg by mouth at bedtime. 10/02/19   Elyce Hams, Marguerita Shih, MD  guaiFENesin  (MUCINEX ) 600 MG 12 hr tablet Take 1 tablet (600 mg total) by mouth 2 (two) times  daily as needed for cough or to loosen phlegm. 03/23/22   Lorita Rosa, MD  meloxicam  (MOBIC ) 7.5 MG tablet Take 1 tablet by mouth daily as needed for pain. 03/08/22   [provider]  oxybutynin  (DITROPAN -XL) 10 MG 24 hr tablet Take 10 mg by mouth daily.    [provider]  phentermine  (ADIPEX-P ) 37.5 MG tablet Take 1 tablet (37.5 mg total) by mouth daily 30 minutes before breakfast. 04/04/23     potassium chloride  SA (KLOR-CON  M) 20 MEQ tablet Take 20 mEq by mouth daily.    [provider]  Semaglutide -Weight Management 1 MG/0.5ML SOAJ Inject 1 mg as directed once a week. 07/26/22     WEGOVY  0.25 MG/0.5ML SOAJ Inject 0.25 mg into the skin once a week at 0900 for 4 doses 02/07/23     WEGOVY  0.25 MG/0.5ML SOAJ Inject 0.25 mg into the skin once a week at 9 am 03/07/23     WEGOVY  0.25 MG/0.5ML SOAJ Inject 0.25 mg into the skin once a week. 04/04/23         Allergies    Patient has no known allergies.    Review of Systems   Review of Systems  All other systems reviewed and are negative.   Physical Exam Updated Vital Signs There were no vitals taken for this visit. Physical Exam Vitals and nursing note reviewed.  Constitutional:      General: She is not in acute distress.    Appearance: She is well-developed.  HENT:  Head: Normocephalic and atraumatic.  Eyes:     Conjunctiva/sclera: Conjunctivae normal.  Cardiovascular:     Rate and Rhythm: Normal rate and regular rhythm.  Pulmonary:     Effort: Pulmonary effort is normal. No respiratory distress.     Breath sounds: Normal breath sounds.  Abdominal:     Palpations: Abdomen is soft.     Tenderness: There is abdominal tenderness in the left lower quadrant. There is left CVA tenderness. There is no guarding. Negative signs include Murphy's sign and McBurney's sign.  Musculoskeletal:        General: No swelling.     Cervical back: Neck supple.     Comments: No midline tenderness cervical, thoracic and lumbar  spine without step-off performed.  Muscular strength 5 out of 5 lower extremities.  No sensory deficits along major nerve extrusions of lower extremities.  DTR symmetric at patella as well as Achilles.  Skin:    General: Skin is warm and dry.     Capillary Refill: Capillary refill takes less than 2 seconds.  Neurological:     Mental Status: She is alert.  Psychiatric:        Mood and Affect: Mood normal.     ED Results / Procedures / Treatments   Labs (all labs ordered are listed, but only abnormal results are displayed) Labs Reviewed - No data to display  EKG None  Radiology No results found.  Procedures Procedures    Medications Ordered in ED Medications - No data to display  ED Course/ Medical Decision Making/ A&P Clinical Course as of 05/21/23 1024  Sat May 21, 2023  1000 Oxybutinin x 7 days [CR]    Clinical Course User Index [CR] [place]ands Butter, PA                                 Medical Decision Making Amount and/or Complexity of Data Reviewed Labs: ordered. Radiology: ordered.  Risk Prescription drug management.   This patient presents to the ED for concern of abdominal pain, this involves an extensive number of treatment options, and is a complaint that carries with it a high risk of complications and morbidity.  The differential diagnosis includes gastritis, PUD, cholecystitis, CBD pathology, SBO/LBO, volvulus, diverticulitis, appendicitis, pyelonephritis, nephrolithiasias, ovarian torsion, cystitis, other   Co morbidities that complicate the patient evaluation  See HPI   Additional history obtained:  Additional history obtained from EMR External records from outside source obtained and reviewed including hospital records   Lab Tests:  I Ordered, and personally interpreted labs.  The pertinent results include: No leukocytosis.  No evidence of anemia.  Platelets within range.  Electra abnormalities.  No transaminitis.  No renal dysfunction.   UA with small hemoglobin, 11-20 RBCs but otherwise unremarkable.   Imaging Studies ordered:  I ordered imaging studies including CT abdomen pelvis I independently visualized and interpreted imaging which showed no acute finding abdomen or pelvis.  Diverticulosis.  Cholelithiasis.  Small umbilical and supraumbilical ventral abdominal wall hernias.  Chronic bilateral L5 pars defects with 1.6 cm anterolisthesis L5 on S1.  Aortic atherosclerosis. I agree with the radiologist interpretation  Cardiac Monitoring: / EKG:  The patient was maintained on a cardiac monitor.  I personally viewed and interpreted the cardiac monitored which showed an underlying rhythm of: Sinus rhythm   Consultations Obtained:  N/a   Problem List / ED Course / Critical interventions / Medication management  Left flank pain I ordered medication including Norco   Reevaluation of the patient after these medicines showed that the patient improved I have reviewed the patients home medicines and have made adjustments as needed   Social Determinants of Health:  Former cigarette use.  Denies illicit drug use.   Test / Admission - Considered:  Left flank pain Vitals signs significant for hyper blood pressure 179/90. Otherwise within normal range and stable throughout visit. Laboratory/imaging studies significant for: See above 68 year old female left flank pain.  Has been having symptoms for the past 3 weeks.  Was having some difficulty with urinating but was started on oxybutynin  around a week ago with improvement of urination.  Reports continued pain.  Reports left-sided flank pain with some radiation to her left hip.  Symptoms worsened with certain positions/movement.  Pain has been constant since onset.  Denies any fever, chills, nausea, vomiting, dysuria, hematuria, change in bowel habits.  Denies history of similar symptoms in the past.  Has tried over-the-counter medications for pain with some improvement. On  exam, left-sided CVA versus paraspinal tenderness noted in the left lumbar region.  Slight left lower quadrant abdominal tenderness.  Labs without evidence of acute emergent process.  CT imaging without evidence of acute intra-abdominal/pelvic abnormality.  Given reassuring workup, suspect patient's symptoms could be secondary to musculoskeletal etiology.  Will trial symptomatic therapy as described in AVS and recommend close follow-up with PCP in the outpatient setting.  Treatment plan discussed with patient and she acknowledged understanding was agreeable to said plan.  Patient overall well-appearing, afebrile in no acute distress. Worrisome signs and symptoms were discussed with the patient, and the patient acknowledged understanding to return to the ED if noticed. Patient was stable upon discharge.         Final Clinical Impression(s) / ED Diagnoses Final diagnoses:  None    Rx / DC Orders ED Discharge Orders     None         Louisa Butter, [place] 05/21/23 1218    Lowery Rue, DO 05/21/23 1449

## 2023-09-27 NOTE — Progress Notes (Signed)
 Attempted to contact the patient to discuss upcoming appointments and recommended health maintenance screenings for the year.  A [facility] Specialist has reviewed the patient's chart to identify any gaps in care and outstanding preventive health needs. However, we were unable to reach the patient to review or schedule the necessary follow-up appointments and screenings.  NA  Left message: yes   MyChart message sent: yes   Voicemail was left including callback details and our hours of operation.  Comments:

## 2023-10-12 ENCOUNTER — Emergency Department (HOSPITAL_COMMUNITY)

## 2023-10-12 ENCOUNTER — Encounter (HOSPITAL_COMMUNITY): Payer: Self-pay | Admitting: Emergency Medicine

## 2023-10-12 ENCOUNTER — Emergency Department (HOSPITAL_COMMUNITY)
Admission: EM | Admit: 2023-10-12 | Discharge: 2023-10-12 | Disposition: A | Discharge status: Home / Self Care | Source: Ambulatory Visit | Attending: Emergency Medicine | Admitting: Emergency Medicine

## 2023-10-12 ENCOUNTER — Other Ambulatory Visit: Payer: Self-pay

## 2023-10-12 DIAGNOSIS — Z7982 Long term (current) use of aspirin: Secondary | ICD-10-CM | POA: Insufficient documentation

## 2023-10-12 DIAGNOSIS — W108XXA Fall (on) (from) other stairs and steps, initial encounter: Secondary | ICD-10-CM | POA: Diagnosis not present

## 2023-10-12 DIAGNOSIS — I10 Essential (primary) hypertension: Secondary | ICD-10-CM | POA: Diagnosis not present

## 2023-10-12 DIAGNOSIS — S0990XA Unspecified injury of head, initial encounter: Secondary | ICD-10-CM | POA: Diagnosis not present

## 2023-10-12 DIAGNOSIS — E876 Hypokalemia: Secondary | ICD-10-CM | POA: Diagnosis not present

## 2023-10-12 DIAGNOSIS — S42032A Displaced fracture of lateral end of left clavicle, initial encounter for closed fracture: Secondary | ICD-10-CM | POA: Diagnosis not present

## 2023-10-12 DIAGNOSIS — Z79899 Other long term (current) drug therapy: Secondary | ICD-10-CM | POA: Diagnosis not present

## 2023-10-12 DIAGNOSIS — S4992XA Unspecified injury of left shoulder and upper arm, initial encounter: Secondary | ICD-10-CM | POA: Diagnosis present

## 2023-10-12 DIAGNOSIS — W19XXXA Unspecified fall, initial encounter: Secondary | ICD-10-CM

## 2023-10-12 LAB — CBC
HCT: 38.9 % (ref 36.0–46.0)
Hemoglobin: 13.6 g/dL (ref 12.0–15.0)
MCH: 30.4 pg (ref 26.0–34.0)
MCHC: 35 g/dL (ref 30.0–36.0)
MCV: 86.8 fL (ref 80.0–100.0)
Platelets: 325 K/uL (ref 150–400)
RBC: 4.48 MIL/uL (ref 3.87–5.11)
RDW: 13.8 % (ref 11.5–15.5)
WBC: 10.3 K/uL (ref 4.0–10.5)
nRBC: 0 % (ref 0.0–0.2)

## 2023-10-12 LAB — COMPREHENSIVE METABOLIC PANEL WITH GFR
ALT: 10 U/L (ref 0–44)
AST: 21 U/L (ref 15–41)
Albumin: 4.4 g/dL (ref 3.5–5.0)
Alkaline Phosphatase: 97 U/L (ref 38–126)
Anion gap: 16 — ABNORMAL HIGH (ref 5–15)
BUN: 16 mg/dL (ref 8–23)
CO2: 23 mmol/L (ref 22–32)
Calcium: 9.8 mg/dL (ref 8.9–10.3)
Chloride: 103 mmol/L (ref 98–111)
Creatinine, Ser: 0.85 mg/dL (ref 0.44–1.00)
GFR, Estimated: >60 mL/min (ref >60)
Glucose, Bld: 131 mg/dL — ABNORMAL HIGH (ref 70–99)
Potassium: 2.9 mmol/L — ABNORMAL LOW (ref 3.5–5.1)
Sodium: 141 mmol/L (ref 135–145)
Total Bilirubin: 0.7 mg/dL (ref 0.0–1.2)
Total Protein: 7.5 g/dL (ref 6.5–8.1)

## 2023-10-12 MED ORDER — OXYCODONE-ACETAMINOPHEN 5-325 MG PO TABS: 1.0000 | ORAL_TABLET | Freq: Four times a day (QID) | ORAL | 0 refills | Status: DC | PRN

## 2023-10-12 MED ORDER — MORPHINE SULFATE (PF) 4 MG/ML IV SOLN
4.0000 mg | Freq: Once | INTRAVENOUS | Status: AC
  Administered 2023-10-12: 4 mg via INTRAVENOUS
  Filled 2023-10-12: qty 1

## 2023-10-12 MED ORDER — POTASSIUM CHLORIDE CRYS ER 20 MEQ PO TBCR
40.0000 meq | EXTENDED_RELEASE_TABLET | Freq: Once | ORAL | Status: AC
  Administered 2023-10-12: 40 meq via ORAL
  Filled 2023-10-12: qty 2

## 2023-10-12 MED ORDER — ONDANSETRON HCL 4 MG/2ML IJ SOLN
4.0000 mg | Freq: Once | INTRAMUSCULAR | Status: AC
  Administered 2023-10-12: 4 mg via INTRAVENOUS
  Filled 2023-10-12: qty 2

## 2023-10-12 NOTE — ED Provider Notes (Signed)
 [place] [facility]ERG[facility] DEPARTMENT AT [facility] Provider Note   CSN: [id number] Arrival date & time: 10/12/23  2028     Patient presents with: April Stevens is a 68 y.o. female.   Pt is a 68 yo with pmhx significant for depression, htn, and sleep apnea.  Pt accidentally fell down the 5 to 6 carpeted stairs and landed on her left shoulder.  Pt did hit her head.  No loc.  She has been ambulatory since fall.        Prior to Admission medications   Medication Sig Start Date End Date Taking? Authorizing Provider  oxyCODONE -acetaminophen  (PERCOCET/ROXICET) 5-325 MG tablet Take 1 tablet by mouth every 6 (six) hours as needed for severe pain (pain score 7-10). 10/12/23  Yes Dean Clarity, MD  albuterol  (VENTOLIN  HFA) 108 (90 Base) MCG/ACT inhaler Inhale 2 puffs into the lungs every 6 (six) hours as needed for wheezing or shortness of breath. 03/23/22   Barbarann Nest, MD  aspirin  81 MG tablet You can restart your baby aspirin  1 daily 09/28/2018. Patient taking differently: Take 81 mg by mouth at bedtime. 09/26/18   Tonnie George, PA-C  chlorthalidone  (HYGROTON ) 25 MG tablet Take 25 mg by mouth daily. 03/22/20   [provider]  Cyanocobalamin  (B-12 PO) Take 1 tablet by mouth daily at 2 PM.    [provider]  FLUoxetine  (PROZAC ) 20 MG capsule Take 3 capsules (60 mg total) by mouth daily. Patient taking differently: Take 60 mg by mouth at bedtime. 10/02/19   Melonie Colonel, Mikel HERO, MD  guaiFENesin  (MUCINEX ) 600 MG 12 hr tablet Take 1 tablet (600 mg total) by mouth 2 (two) times daily as needed for cough or to loosen phlegm. 03/23/22   Barbarann Nest, MD  meloxicam  (MOBIC ) 7.5 MG tablet Take 1 tablet by mouth daily as needed for pain. 03/08/22   [provider]  methylPREDNISolone  (MEDROL  DOSEPAK) 4 MG TBPK tablet See pack instructions 05/21/23   Silver Fell A, PA  oxybutynin  (DITROPAN -XL) 10 MG 24 hr tablet Take 10 mg by mouth daily.    [provider]  phentermine  (ADIPEX-P ) 37.5 MG tablet Take 1 tablet (37.5 mg total) by mouth daily 30 minutes before breakfast. 04/04/23     potassium chloride  SA (KLOR-CON  M) 20 MEQ tablet Take 20 mEq by mouth daily.    [provider]  Semaglutide -Weight Management 1 MG/0.5ML SOAJ Inject 1 mg as directed once a week. 07/26/22     WEGOVY  0.25 MG/0.5ML SOAJ Inject 0.25 mg into the skin once a week at 0900 for 4 doses 02/07/23     WEGOVY  0.25 MG/0.5ML SOAJ Inject 0.25 mg into the skin once a week at 9 am 03/07/23     WEGOVY  0.25 MG/0.5ML SOAJ Inject 0.25 mg into the skin once a week. 04/04/23       Allergies: Patient has no known allergies.    Review of Systems  Musculoskeletal:        Left shoulder pain  All other systems reviewed and are negative.   Updated Vital Signs BP (!) 220/89 (BP Location: Right Arm)   Pulse 82   Temp 98.7 F (37.1 C) (Oral)   Resp 20   SpO2 95%   Physical Exam Vitals and nursing note reviewed.  Constitutional:      Appearance: Normal appearance.  HENT:     Head: Normocephalic and atraumatic.     Right Ear: External ear normal.  Left Ear: External ear normal.     Nose: Nose normal.     Mouth/Throat:     Mouth: Mucous membranes are moist.     Pharynx: Oropharynx is clear.  Eyes:     Extraocular Movements: Extraocular movements intact.     Conjunctiva/sclera: Conjunctivae normal.     Pupils: Pupils are equal, round, and reactive to light.  Cardiovascular:     Rate and Rhythm: Normal rate and regular rhythm.     Pulses: Normal pulses.     Heart sounds: Normal heart sounds.  Pulmonary:     Effort: Pulmonary effort is normal.     Breath sounds: Normal breath sounds.  Abdominal:     General: Abdomen is flat. Bowel sounds are normal.     Palpations: Abdomen is soft.  Musculoskeletal:       Arms:     Cervical back: Normal range of motion and neck supple.  Skin:    General: Skin is warm.     Capillary Refill: Capillary refill takes less  than 2 seconds.     Comments: No skin tenting  Neurological:     General: No focal deficit present.     Mental Status: She is alert and oriented to person, place, and time.  Psychiatric:        Mood and Affect: Mood normal.        Behavior: Behavior normal.     (all labs ordered are listed, but only abnormal results are displayed) Labs Reviewed  COMPREHENSIVE METABOLIC PANEL WITH GFR - Abnormal; Notable for the following components:      Result Value   Potassium 2.9 (*)    Glucose, Bld 131 (*)    Anion gap 16 (*)    All other components within normal limits  CBC    EKG: None  Radiology: CT Cervical Spine Wo Contrast Result Date: 10/12/2023 CLINICAL DATA:  Fall EXAM: CT CERVICAL SPINE WITHOUT CONTRAST TECHNIQUE: Multidetector CT imaging of the cervical spine was performed without intravenous contrast. Multiplanar CT image reconstructions were also generated. RADIATION DOSE REDUCTION: This exam was performed according to the departmental dose-optimization program which includes automated exposure control, adjustment of the mA and/or kV according to patient size and/or use of iterative reconstruction technique. COMPARISON:  None Available. FINDINGS: Alignment: 3 mm degenerative anterolisthesis of C4 on C5. Skull base and vertebrae: No acute fracture. No primary bone lesion or focal pathologic process. Soft tissues and spinal canal: No prevertebral fluid or swelling. No visible canal hematoma. Disc levels: Degenerative disc disease most pronounced at C5-6 and C6-7 with disc space narrowing and spurring. Moderate to advanced bilateral degenerative facet disease. Multilevel bilateral neural foraminal narrowing, left greater than right. Upper chest: No acute findings Other: None IMPRESSION: Multilevel degenerative changes.  No acute bony abnormality. Electronically Signed   By: Franky Crease M.D.   On: 10/12/2023 22:38   CT Head Wo Contrast Result Date: 10/12/2023 CLINICAL DATA:  Polytrauma,  blunt.  Fall. EXAM: CT HEAD WITHOUT CONTRAST TECHNIQUE: Contiguous axial images were obtained from the base of the skull through the vertex without intravenous contrast. RADIATION DOSE REDUCTION: This exam was performed according to the departmental dose-optimization program which includes automated exposure control, adjustment of the mA and/or kV according to patient size and/or use of iterative reconstruction technique. COMPARISON:  10/08/2014 FINDINGS: Brain: No acute intracranial abnormality. Specifically, no hemorrhage, hydrocephalus, mass lesion, acute infarction, or significant intracranial injury. Vascular: No hyperdense vessel or unexpected calcification. Skull: No acute calvarial abnormality.  Sinuses/Orbits: No acute findings Other: None IMPRESSION: No acute intracranial abnormality. Electronically Signed   By: Franky Crease M.D.   On: 10/12/2023 22:36   DG Chest Portable 1 View Result Date: 10/12/2023 CLINICAL DATA:  Fall EXAM: PORTABLE CHEST 1 VIEW COMPARISON:  03/21/2022 FINDINGS: Heart and mediastinal contours are within normal limits. Aortic atherosclerosis. Linear densities in the lung bases could reflect scarring or atelectasis. No effusions or pneumothorax. Displaced, overlapping distal left clavicle fracture noted as seen on clavicle series. IMPRESSION: Bibasilar atelectasis or scarring. Distal left clavicle fracture. Electronically Signed   By: Franky Crease M.D.   On: 10/12/2023 21:24   DG Pelvis Portable Result Date: 10/12/2023 CLINICAL DATA:  Fall EXAM: PORTABLE PELVIS 1-2 VIEWS COMPARISON:  None Available. FINDINGS: There is no evidence of pelvic fracture or diastasis. No pelvic bone lesions are seen. IMPRESSION: Mild symmetric degenerative changes in the hips with joint space narrowing and spurring. No acute bony abnormality. Specifically, no fracture, subluxation, or dislocation. No acute bony abnormality. Electronically Signed   By: Franky Crease M.D.   On: 10/12/2023 21:23   DG  Shoulder Left Portable Result Date: 10/12/2023 CLINICAL DATA:  Fall, left shoulder pain EXAM: LEFT SHOULDER COMPARISON:  None Available. FINDINGS: Markedly displaced distal left clavicle fracture. The distal fragment is displaced inferiorly and overlapping the remainder the clavicle approximately 3.3 cm. AC joint appears intact. Moderate to advanced degenerative changes in the left [facility] and glenohumeral joints. No humeral abnormality. IMPRESSION: Displaced and overlapping distal left clavicle fracture. Electronically Signed   By: Franky Crease M.D.   On: 10/12/2023 21:22     Procedures   Medications Ordered in the ED  potassium chloride  SA (KLOR-CON  M) CR tablet 40 mEq (has no administration in time range)  morphine  (PF) 4 MG/ML injection 4 mg (4 mg Intravenous Given 10/12/23 2112)  ondansetron  (ZOFRAN ) injection 4 mg (4 mg Intravenous Given 10/12/23 2111)  morphine  (PF) 4 MG/ML injection 4 mg (4 mg Intravenous Given 10/12/23 2238)                                    Medical Decision Making Amount and/or Complexity of Data Reviewed Labs: ordered. Radiology: ordered.  Risk Prescription drug management.   This patient presents to the ED for concern of fall, this involves an extensive number of treatment options, and is a complaint that carries with it a high risk of complications and morbidity.  The differential diagnosis includes multiple trauma   Co morbidities that complicate the patient evaluation  depression, htn, and sleep apnea   Additional history obtained:  Additional history obtained from epic chart review External records from outside source obtained and reviewed including friend   Lab Tests:  I Ordered, and personally interpreted labs.  The pertinent results include:  cbc nl, cmp with k low at 2.9   Imaging Studies ordered:  I ordered imaging studies including ct head/c-spine, left shoulder, cxr, pelvis  I independently visualized and interpreted imaging which  showed  CXR: Bibasilar atelectasis or scarring.    Distal left clavicle fracture.  Pelvis: Mild symmetric degenerative changes in the hips with joint space  narrowing and spurring. No acute bony abnormality. Specifically, no  fracture, subluxation, or dislocation. No acute bony abnormality.  L shoulder: Displaced and overlapping distal left clavicle fracture.  CT head: No acute intracranial abnormality.  CT cervical spine: Multilevel degenerative changes.  No acute bony abnormality.  I agree with the radiologist interpretation   Medicines ordered and prescription drug management:  I ordered medication including morphine /zofran   for sx  Reevaluation of the patient after these medicines showed that the patient improved I have reviewed the patients home medicines and have made adjustments as needed   Test Considered:  Ct   Critical Interventions:  Pain control   Consultations Obtained:  I requested consultation with the orthopedist (Dr. Fidel),  and discussed lab and imaging findings as well as pertinent plan -he recommends sling and outpatient f/u   Problem List / ED Course:  Distal clavicle fx:  sling applied.  Pt will need to f/u with ortho. Hypokalemia:  pt given kdur in ED.  She takes K at home.  She is instructed to continue taking it, but 1 pill instead of 1/2.    Reevaluation:  After the interventions noted above, I reevaluated the patient and found that they have :improved   Social Determinants of Health:  Lives at home   Dispostion:  After consideration of the diagnostic results and the patients response to treatment, I feel that the patent would benefit from discharge with outpatient f/u.       Final diagnoses:  Fall, initial encounter  Closed displaced fracture of acromial end of left clavicle, initial encounter  Hypokalemia    ED Discharge Orders          Ordered    oxyCODONE -acetaminophen  (PERCOCET/ROXICET) 5-325 MG tablet  Every 6  hours PRN        10/12/23 2210               Dean Clarity, MD 10/12/23 2251

## 2023-10-12 NOTE — ED Triage Notes (Signed)
 Patient c/o fall tonight. Patient reports that she fell down the stairs while vacuuming. Obvious deformity on her left shoulder.  Patient report she hit her head. Patient denies LOC. Patient denies taking blood thinner. Patient a/ox4. Patient ambulatory at triage.

## 2023-10-19 DIAGNOSIS — M898X1 Other specified disorders of bone, shoulder: Secondary | ICD-10-CM | POA: Insufficient documentation

## 2023-10-20 ENCOUNTER — Other Ambulatory Visit: Payer: Self-pay

## 2023-10-20 ENCOUNTER — Encounter (HOSPITAL_COMMUNITY): Payer: Self-pay

## 2023-10-20 NOTE — H&P (Signed)
 ORTHOPAEDIC H&P   PCP:  Melonie Colonel, Mikel HERO, MD  Chief Complaint: Left shoulder pain  HPI: April Stevens is a 68 y.o. right hand dominant female who was seen by my partner Elspeth Her MD and myself in clinic s/p sustaining a left shoulder injury slipping down 5 steps on 9/24.  She presented to the emergency department at that time where she had x-rays taken and was diagnosed with a distal clavicle fracture.  The patient was provided a sling and then advised to follow-up with orthopedic surgery for for further follow-up.  Patient reports that she has pain that is localized to the left shoulder that is worse with motion and better with rest.  She has no associated numbness or tingling of the left upper extremity.  She does report a prior history of the left distal radius fracture which underwent ORIF in 2023 as well as bilateral rotator cuff repairs greater than 5 years ago by Dr. Duwayne.  Patient reports that she has taken Tylenol  and anti-inflammatories with modest improvement in her symptoms.  She has a history of hypertension, apnea, as well as depression and is a former smoker with a 60-pack-year history  She works part time as a Facilities manager.  Past Medical History:  Diagnosis Date   Depression    Hypertension    [name]    Past Surgical History:  Procedure Laterality Date   APPENDECTOMY  09/26/2018   HYSTERECTOMY ABDOMINAL WITH SALPINGO-OOPHORECTOMY  2000   DUB   LAPAROSCOPIC APPENDECTOMY N/A 09/26/2018   Procedure: APPENDECTOMY LAPAROSCOPIC;  Surgeon: Kinsinger, Herlene Righter, MD;  Location: [facility] OR;  Service: General;  Laterality: N/A;   OPEN REDUCTION INTERNAL FIXATION (ORIF) DISTAL RADIAL FRACTURE Left 08/17/2021   Procedure: OPEN REDUCTION INTERNAL FIXATION (ORIF) LEFT DISTAL RADIAL FRACTURE;  Surgeon: Romona Harari, MD;  Location: [facility];  Service: Orthopedics;  Laterality: Left;   SHOULDER SURGERY Left    01-02-2018, has had R shoulder rotator cuff  previously   TONSILLECTOMY     Social History   Socioeconomic History   Marital status: Single    Spouse name: Not on file   Number of children: Not on file   Years of education: Not on file   Highest education level: Not on file  Occupational History   Not on file  Tobacco Use   Smoking status: Former    Current packs/day: 0.00    Average packs/day: 3.0 packs/day for 20.0 years (60.0 ttl pk-yrs)    Types: Cigarettes    Start date: 1    Quit date: 2000    Years since quitting: 25.7   Smokeless tobacco: Never  Vaping Use   Vaping status: Never Used  Substance and Sexual Activity   Alcohol use: Yes    Comment: socially   Drug use: No   Sexual activity: Yes  Other Topics Concern   Not on file  Social History Narrative   Not on file   Social Drivers of Health   Financial Resource Strain: Low Risk  (09/21/2022)   Received from [facility]   Overall Financial Resource Strain (CARDIA)    Difficulty of Paying Living Expenses: Not hard at all  Food Insecurity: No Food Insecurity (09/21/2022)   Received [facility]   Hunger Vital Sign    Within the past 12 months, you worried that your food would run out before you got the money to buy more.: Never true    Within the past 12  months, the food you bought just didn't last and you didn't have money to get more.: Never true  Transportation Needs: No Transportation Needs (09/21/2022)   Received from [facility] - Transportation    Lack of Transportation (Medical): No    Lack of Transportation (Non-Medical): No  Physical Activity: Sufficiently Active (09/21/2022)   Received from [facility]   Exercise Vital Sign    On average, how many days per week do you engage in moderate to strenuous exercise (like a brisk walk)?: 7 days    On average, how many minutes do you engage in exercise at this level?: 30 min  Stress: No Stress Concern Present (09/21/2022)   Received from [facility] - Occupational Stress Questionnaire    Feeling of Stress : Only a little  Social Connections: Moderately Integrated (09/21/2022)   Received from [facility]   Social Network    How would you rate your social network (family, work, friends)?: Adequate participation with social networks   Family History  Problem Relation Age of Onset   Hypertension Father    Heart attack Father    Sleep apnea Neg Hx    No Known Allergies Prior to Admission medications   Medication Sig Start Date End Date Taking? Authorizing Provider  aspirin  81 MG tablet You can restart your baby aspirin  1 daily 09/28/2018. Patient taking differently: Take 81 mg by mouth at bedtime. 09/26/18  Yes Tonnie George, PA-C  albuterol  (VENTOLIN  HFA) 108 (90 Base) MCG/ACT inhaler Inhale 2 puffs into the lungs every 6 (six) hours as needed for wheezing or shortness of breath. Patient not taking: Reported on 10/20/2023 03/23/22   Barbarann Nest, MD  chlorthalidone  (HYGROTON ) 25 MG tablet Take 25 mg by mouth daily. 03/22/20   [provider]  Cyanocobalamin  (B-12 PO) Take 1 tablet by mouth daily at 2 PM.    [provider]  FLUoxetine  (PROZAC ) 20 MG capsule Take 3 capsules (60 mg total) by mouth daily. Patient taking differently: Take 60 mg by mouth at bedtime. 10/02/19   Melonie Colonel, Mikel HERO, MD  guaiFENesin  (MUCINEX ) 600 MG 12 hr tablet Take 1 tablet (600 mg total) by mouth 2 (two) times daily as needed for cough or to loosen phlegm. Patient not taking: Reported on 10/20/2023 03/23/22   Barbarann Nest, MD  meloxicam  (MOBIC ) 7.5 MG tablet Take 1 tablet by mouth daily as needed for pain. 03/08/22   [provider]  methylPREDNISolone  (MEDROL  DOSEPAK) 4 MG TBPK tablet See pack instructions Patient not taking: Reported on 10/20/2023 05/21/23   Silver Wonda LABOR, PA  oxybutynin  (DITROPAN -XL) 10 MG 24 hr tablet Take 10 mg by mouth daily.    [provider]  oxyCODONE -acetaminophen   (PERCOCET/ROXICET) 5-325 MG tablet Take 1 tablet by mouth every 6 (six) hours as needed for severe pain (pain score 7-10). 10/12/23   Dean Clarity, MD  phentermine  (ADIPEX-P ) 37.5 MG tablet Take 1 tablet (37.5 mg total) by mouth daily 30 minutes before breakfast. 04/04/23     potassium chloride  SA (KLOR-CON  M) 20 MEQ tablet Take 20 mEq by mouth daily.    [provider]  Semaglutide -Weight Management 1 MG/0.5ML SOAJ Inject 1 mg as directed once a week. 07/26/22     WEGOVY  0.25 MG/0.5ML SOAJ Inject 0.25 mg into the skin once a week at 0900 for 4 doses 02/07/23     WEGOVY  0.25 MG/0.5ML SOAJ Inject 0.25 mg into the skin once  a week at 9 am 03/07/23     WEGOVY  0.25 MG/0.5ML SOAJ Inject 0.25 mg into the skin once a week. 04/04/23      No results found.  Positive ROS: All other systems have been reviewed and were otherwise negative with the exception of those mentioned in the HPI and as above.  Physical Exam: General: Alert, no acute distress Cardiovascular: intact distal pulses Respiratory: No cyanosis, no use of accessory musculature GI: No organomegaly, abdomen is soft and non-tender Skin: Ecchymosis and swelling of the left shoulder, skin intact Neurologic: Sensation intact distally Psychiatric: Patient is competent for consent with normal mood and affect Lymphatic: No axillary or cervical lymphadenopathy  MUSCULOSKELETAL: Left upper extremity - Skin is intact with ecchymosis and swelling over the left distal clavicle.  There is no skin tenting - Tender to patient over the left distal clavicle.  No tenderness over the proximal humerus or ipsilateral upper extremity - Sensation intact to the axillary, median, radial, ulnar nerve distributions to light touch. - Motor intact to the median, radial, ulnar nerve distributions. - 2+ radial pulse  X-rays: 2 views of the left clavicle were obtained and reviewed with the patient.  My independent interpretation is as follows: Displaced,  shortened, and comminuted distal third clavicle fracture with disassociation of the attachment of the CC ligaments from the remaining clavicle, consistent with a Neer  type V fracture pattern.  Osteopenia present.  Assessment: 68 year old female status post fall sustaining a left comminuted distal clavicle fracture.  Given the fracture morphology and displacement, the patient is indicated to undergo open reduction internal fixation of her left distal clavicle fracture to improve alignment and confer stability to her fracture.  Discussion was had the patient regarding risk, benefits, and alternatives.  The risks include infection, blood loss, damage to surrounding structures such as nerves, arteries, veins, malunion, nonunion, the need for additional procedures, persistent stiffness in the left shoulder.  It was explained that Ancef  would be given preoperatively to reduce risk of infection.  The patient expressed understanding of the procedure and the risks and associated benefits and elected to proceed with open reduction internal fixation of her left distal clavicle fracture as well as repair to her coracoclavicular ligaments.  Informed consent was obtained.  Plan: - Proceed to the operating room today for open reduction internal fixation of her left distal apical fracture with repair of her coracoclavicular ligaments -Nonweightbearing to the left upper extremity - Continue to maintain sling - Oxy codone 5 mg every 6 hours as needed for pain - Tylenol  650 mg Q6 as needed for pain - Bowel regimen recommended of docusate and senna while patient continues to require narcotic pain medication - Patient is planned to have surgery on outpatient basis and return to the clinic in 2 weeks for skin check and repeat x-rays postoperatively.    Rankin LELON Pizza, MD  10/21/2023 6:49 AM

## 2023-10-20 NOTE — Progress Notes (Signed)
 SDW CALL  Patient was given pre-op instructions over the phone. The opportunity was given for the patient to ask questions. No further questions asked. Patient verbalized understanding of instructions given.   PCP - Aldona Teresa PIETY Cardiologist - denies  PPM/ICD - denies Device Orders -  Rep Notified -   Chest x-ray - 10/12/23-1 view EKG - DOS Stress Test - cardiolite study 11/02/17 ECHO - denies Cardiac Cath - denies  Sleep Study - 04/18/17 CPAP - yes  Fasting Blood Sugar - na Checks Blood Sugar _____ times a day  Blood Thinner Instructions:na Aspirin  Instructions:pt reports her last dose of aspirin  was 10/1.   ERAS Protcol -clears until 0430 PRE-SURGERY Ensure or G2- no  COVID TEST- na   Anesthesia review: yes- pt takes Phentermine ,Wegovy ,hx essential hypertension   Special instructions:    Oral Hygiene is also important to reduce your risk of infection.  Remember - BRUSH YOUR TEETH THE MORNING OF SURGERY WITH YOUR REGULAR TOOTHPASTE

## 2023-10-20 NOTE — Anesthesia Preprocedure Evaluation (Signed)
 Anesthesia Evaluation  Patient identified by MRN, date of birth, ID band Patient awake    Reviewed: Allergy & Precautions, NPO status , Patient's Chart, lab work & pertinent test results  Airway Mallampati: III  TM Distance: >3 FB Neck ROM: Full    Dental  (+) Dental Advisory Given, Teeth Intact   Pulmonary sleep apnea , former smoker   Pulmonary exam normal breath sounds clear to auscultation       Cardiovascular hypertension, Pt. on medications Normal cardiovascular exam Rhythm:Regular Rate:Normal     Neuro/Psych  PSYCHIATRIC DISORDERS Anxiety Depression    negative neurological ROS     GI/Hepatic negative GI ROS, Neg liver ROS,,,  Endo/Other  negative endocrine ROS    Renal/GU negative Renal ROS     Musculoskeletal negative musculoskeletal ROS (+)    Abdominal  (+) + obese  Peds  Hematology negative hematology ROS (+)   Anesthesia Other Findings   Reproductive/Obstetrics negative OB ROS                              Anesthesia Physical Anesthesia Plan  ASA: 2  Anesthesia Plan: General   Post-op Pain Management:    Induction: Intravenous  PONV Risk Score and Plan: 3 and Ondansetron , Dexamethasone , Midazolam  and Treatment may vary due to age or medical condition  Airway Management Planned: Oral ETT  Additional Equipment:   Intra-op Plan:   Post-operative Plan: Extubation in OR  Informed Consent: I have reviewed the patients History and Physical, chart, labs and discussed the procedure including the risks, benefits and alternatives for the proposed anesthesia with the patient or authorized representative who has indicated his/her understanding and acceptance.     Dental advisory given  Plan Discussed with: CRNA  Anesthesia Plan Comments: (PAT note written 10/20/2023 by Analiza Cowger, PA-C. iSTAT ordered for day of surgery due to K 2.9, supplemented in the ED.   )          Anesthesia Quick Evaluation

## 2023-10-20 NOTE — Progress Notes (Signed)
 Anesthesia Chart Review: April Stevens  Case: [id number] Date/Time: 10/21/23 0715   Procedure: OPEN REDUCTION INTERNAL FIXATION (ORIF) CLAVICULAR FRACTURE (Left)   Anesthesia type: Choice   Diagnosis: Closed displaced fracture of acromial end of left clavicle, initial encounter [S42.032A]   Pre-op diagnosis: LEFT SHOULDER DISTAL CLAVICLE FRACTURE   [facility]: [facility] ROOM 04 / [facility]   Surgeons: Teresa Rankin ORN, MD       DISCUSSION: Patient is a 68 year old female scheduled for the above procedure.  She had a mechanical fall down carpeted stairs on 10/12/2023 sustaining a displaced and overlapping distal left clavicular fracture.  Sling applied and outpatient Ortho follow-up arranged.  She was also given given an additional KCl 40 mEq for treatment of hypokalemia with potassium of 2.9.   History includes former smoker (quit 2000), HTN, OSA (severe 04/18/2017 HST), depression, appendectomy (09/26/2018), hysterectomy/BSO (04/25/2003).  Last phentermine  and Wegovy  were about six months ago due to cost.   She is a same-day workup, so anesthesia team to evaluate on the day of surgery.  Last EKG seen is greater than 1 year ago.  She had a CBC and BMP on 10/12/2023 which overall unremarkable except for K 2.9 which was supplemented. Will plan for iSTAT on the day of surgery to recheck K.   VS:  Wt Readings from Last 3 Encounters:  05/21/23 76.2 kg  05/10/22 86.8 kg  03/21/22 86.5 kg   BP Readings from Last 3 Encounters:  10/12/23 (!) 158/96  05/21/23 (!) 156/70  05/10/22 (!) 149/76   Pulse Readings from Last 3 Encounters:  10/12/23 88  05/21/23 71  05/10/22 79     PROVIDERS: Melonie Colonel, Mikel HERO, MD is PCP    LABS: Last labs in [facility] include: Lab Results  Component Value Date   WBC 10.3 10/12/2023   HGB 13.6 10/12/2023   HCT 38.9 10/12/2023   PLT 325 10/12/2023   GLUCOSE 131 (H) 10/12/2023   ALT 10 10/12/2023   AST 21 10/12/2023   NA 141 10/12/2023   K 2.9 (L) 10/12/2023   CL 103  10/12/2023   CREATININE 0.85 10/12/2023   BUN 16 10/12/2023   CO2 23 10/12/2023      IMAGES: CT C-spine 10/12/2023: IMPRESSION: Multilevel degenerative changes.  No acute bony abnormality.   CT Head 10/12/2023: IMPRESSION: No acute intracranial abnormality.   1V CXR 10/12/2023: FINDINGS: Heart and mediastinal contours are within normal limits. Aortic atherosclerosis. Linear densities in the lung bases could reflect scarring or atelectasis. No effusions or pneumothorax. Displaced, overlapping distal left clavicle fracture noted as seen on clavicle series. IMPRESSION: Bibasilar atelectasis or scarring. Distal left clavicle fracture.   Xray left shoulder 10/12/2023: IMPRESSION: Displaced and overlapping distal left clavicle fracture.   EKG: For day of surgery as indicated.  Last EKG noted is from 03/21/2022 and showed: Normal sinus rhythm Borderline left axis deviation Nonspecific T abnormalities, lateral leads poor baseline Confirmed by Jerrol Agent ([id number]) on 03/21/2022 3:15:59 PM   CV: Nuclear stress test 11/03/2007: IMPRESSION:  Normal examination without evidence of stress induced myocardial ischemia. The calculated left ventricular ejection fraction is  79%.    Past Medical History:  Diagnosis Date   Depression    Hypertension    [name]     Past Surgical History:  Procedure Laterality Date   APPENDECTOMY  09/26/2018   HYSTERECTOMY ABDOMINAL WITH SALPINGO-OOPHORECTOMY  2000   DUB   LAPAROSCOPIC APPENDECTOMY N/A 09/26/2018   Procedure: APPENDECTOMY LAPAROSCOPIC;  Surgeon:  Kinsinger, Herlene Righter, MD;  [facility];  Service: General;  Laterality: N/A;   OPEN REDUCTION INTERNAL FIXATION (ORIF) DISTAL RADIAL FRACTURE Left 08/17/2021   Procedure: OPEN REDUCTION INTERNAL FIXATION (ORIF) LEFT DISTAL RADIAL FRACTURE;  Surgeon: Romona Harari, MD;  Location: [facility];  Service: Orthopedics;  Laterality: Left;   SHOULDER SURGERY Left     01-02-2018, has had R shoulder rotator cuff previously   TONSILLECTOMY      MEDICATIONS: No current facility-administered medications for this encounter.    albuterol  (VENTOLIN  HFA) 108 (90 Base) MCG/ACT inhaler   aspirin  81 MG tablet   chlorthalidone  (HYGROTON ) 25 MG tablet   Cyanocobalamin  (B-12 PO)   FLUoxetine  (PROZAC ) 20 MG capsule   guaiFENesin  (MUCINEX ) 600 MG 12 hr tablet   meloxicam  (MOBIC ) 7.5 MG tablet   methylPREDNISolone  (MEDROL  DOSEPAK) 4 MG TBPK tablet   oxybutynin  (DITROPAN -XL) 10 MG 24 hr tablet   oxyCODONE -acetaminophen  (PERCOCET/ROXICET) 5-325 MG tablet   phentermine  (ADIPEX-P ) 37.5 MG tablet   potassium chloride  SA (KLOR-CON  M) 20 MEQ tablet   Semaglutide -Weight Management 1 MG/0.5ML SOAJ   WEGOVY  0.25 MG/0.5ML SOAJ   WEGOVY  0.25 MG/0.5ML SOAJ   WEGOVY  0.25 MG/0.5ML SOAJ     Isaiah Ruder, PA-C Surgical Short Stay/Anesthesiology [facility] Phone [phone] [facility] Phone [phone] 10/20/2023 4:29 PM

## 2023-10-21 ENCOUNTER — Encounter (HOSPITAL_COMMUNITY): Admitting: Vascular Surgery

## 2023-10-21 ENCOUNTER — Ambulatory Visit (HOSPITAL_COMMUNITY)

## 2023-10-21 ENCOUNTER — Ambulatory Visit (HOSPITAL_COMMUNITY): Admission: RE | Admit: 2023-10-21 | Discharge: 2023-10-21 | Disposition: A | Discharge status: Home / Self Care

## 2023-10-21 ENCOUNTER — Ambulatory Visit (HOSPITAL_COMMUNITY): Admitting: Vascular Surgery

## 2023-10-21 ENCOUNTER — Other Ambulatory Visit: Payer: Self-pay

## 2023-10-21 ENCOUNTER — Encounter (HOSPITAL_COMMUNITY): Admission: RE | Disposition: A | Discharge status: Home / Self Care | Payer: Self-pay | Source: Home / Self Care

## 2023-10-21 DIAGNOSIS — E876 Hypokalemia: Secondary | ICD-10-CM

## 2023-10-21 DIAGNOSIS — I10 Essential (primary) hypertension: Secondary | ICD-10-CM | POA: Diagnosis not present

## 2023-10-21 DIAGNOSIS — S42032A Displaced fracture of lateral end of left clavicle, initial encounter for closed fracture: Secondary | ICD-10-CM | POA: Insufficient documentation

## 2023-10-21 DIAGNOSIS — Z87891 Personal history of nicotine dependence: Secondary | ICD-10-CM | POA: Insufficient documentation

## 2023-10-21 DIAGNOSIS — G4733 Obstructive sleep apnea (adult) (pediatric): Secondary | ICD-10-CM | POA: Insufficient documentation

## 2023-10-21 DIAGNOSIS — W109XXA Fall (on) (from) unspecified stairs and steps, initial encounter: Secondary | ICD-10-CM | POA: Insufficient documentation

## 2023-10-21 HISTORY — PX: ORIF CLAVICULAR FRACTURE: SHX5055

## 2023-10-21 LAB — POCT I-STAT, CHEM 8
BUN: 12 mg/dL (ref 8–23)
Calcium, Ion: 1.12 mmol/L — ABNORMAL LOW (ref 1.15–1.40)
Chloride: 103 mmol/L (ref 98–111)
Creatinine, Ser: 0.8 mg/dL (ref 0.44–1.00)
Glucose, Bld: 131 mg/dL — ABNORMAL HIGH (ref 70–99)
HCT: 37 % (ref 36.0–46.0)
Hemoglobin: 12.6 g/dL (ref 12.0–15.0)
Potassium: 3.3 mmol/L — ABNORMAL LOW (ref 3.5–5.1)
Sodium: 139 mmol/L (ref 135–145)
TCO2: 24 mmol/L (ref 22–32)

## 2023-10-21 SURGERY — OPEN REDUCTION INTERNAL FIXATION (ORIF) CLAVICULAR FRACTURE
Anesthesia: General | Site: Shoulder | Laterality: Left

## 2023-10-21 MED ORDER — AMISULPRIDE (ANTIEMETIC) 5 MG/2ML IV SOLN
10.0000 mg | Freq: Once | INTRAVENOUS | Status: AC | PRN
  Administered 2023-10-21: 10 mg via INTRAVENOUS

## 2023-10-21 MED ORDER — EPHEDRINE SULFATE-NACL 50-0.9 MG/10ML-% IV SOSY
PREFILLED_SYRINGE | INTRAVENOUS | Status: DC | PRN
  Administered 2023-10-21: 10 mg via INTRAVENOUS
  Administered 2023-10-21: 15 mg via INTRAVENOUS

## 2023-10-21 MED ORDER — IRRISEPT - 450ML BOTTLE WITH 0.05% CHG IN STERILE WATER, USP 99.95% OPTIME
TOPICAL | Status: DC | PRN
Start: 2023-10-21 — End: 2023-10-21
  Administered 2023-10-21: 450 mL

## 2023-10-21 MED ORDER — LACTATED RINGERS IV SOLN: INTRAVENOUS | Status: DC

## 2023-10-21 MED ORDER — SENNOSIDES-DOCUSATE SODIUM 8.6-50 MG PO TABS: 1.0000 | ORAL_TABLET | Freq: Every day | ORAL | 0 refills | Status: AC

## 2023-10-21 MED ORDER — CEFAZOLIN SODIUM-DEXTROSE 2-3 GM-%(50ML) IV SOLR
INTRAVENOUS | Status: DC | PRN
  Administered 2023-10-21: 2 g via INTRAVENOUS

## 2023-10-21 MED ORDER — KETOROLAC TROMETHAMINE 30 MG/ML IJ SOLN
INTRAMUSCULAR | Status: DC | PRN
  Administered 2023-10-21: 30 mg via INTRAVENOUS

## 2023-10-21 MED ORDER — OXYCODONE HCL 5 MG PO TABS: 5.0000 mg | ORAL_TABLET | Freq: Once | ORAL | Status: DC | PRN

## 2023-10-21 MED ORDER — ONDANSETRON HCL 4 MG/2ML IJ SOLN
INTRAMUSCULAR | Status: AC
  Filled 2023-10-21: qty 2

## 2023-10-21 MED ORDER — MIDAZOLAM HCL 2 MG/2ML IJ SOLN
INTRAMUSCULAR | Status: AC
  Filled 2023-10-21: qty 2

## 2023-10-21 MED ORDER — HYDROMORPHONE HCL 1 MG/ML IJ SOLN
INTRAMUSCULAR | Status: AC
  Filled 2023-10-21: qty 0.5

## 2023-10-21 MED ORDER — LIDOCAINE 2% (20 MG/ML) 5 ML SYRINGE
INTRAMUSCULAR | Status: DC | PRN
  Administered 2023-10-21: 80 mg via INTRAVENOUS

## 2023-10-21 MED ORDER — AMISULPRIDE (ANTIEMETIC) 5 MG/2ML IV SOLN
INTRAVENOUS | Status: AC
  Filled 2023-10-21: qty 4

## 2023-10-21 MED ORDER — ORAL CARE MOUTH RINSE: 15.0000 mL | Freq: Once | OROMUCOSAL | Status: AC

## 2023-10-21 MED ORDER — PROPOFOL 10 MG/ML IV BOLUS
INTRAVENOUS | Status: DC | PRN
  Administered 2023-10-21: 110 mg via INTRAVENOUS

## 2023-10-21 MED ORDER — HYDROMORPHONE HCL 1 MG/ML IJ SOLN
INTRAMUSCULAR | Status: DC | PRN
  Administered 2023-10-21: .5 mg via INTRAVENOUS

## 2023-10-21 MED ORDER — EPHEDRINE 5 MG/ML INJ
INTRAVENOUS | Status: AC
  Filled 2023-10-21: qty 5

## 2023-10-21 MED ORDER — MEPERIDINE HCL 25 MG/ML IJ SOLN: 6.2500 mg | INTRAMUSCULAR | Status: DC | PRN

## 2023-10-21 MED ORDER — SODIUM CHLORIDE 0.9 % IV SOLN: 12.5000 mg | INTRAVENOUS | Status: DC | PRN

## 2023-10-21 MED ORDER — FENTANYL CITRATE (PF) 250 MCG/5ML IJ SOLN
INTRAMUSCULAR | Status: DC | PRN
  Administered 2023-10-21: 50 ug via INTRAVENOUS
  Administered 2023-10-21: 100 ug via INTRAVENOUS
  Administered 2023-10-21: 50 ug via INTRAVENOUS

## 2023-10-21 MED ORDER — ROCURONIUM BROMIDE 10 MG/ML (PF) SYRINGE
PREFILLED_SYRINGE | INTRAVENOUS | Status: DC | PRN
  Administered 2023-10-21: 10 mg via INTRAVENOUS
  Administered 2023-10-21: 80 mg via INTRAVENOUS
  Administered 2023-10-21: 10 mg via INTRAVENOUS

## 2023-10-21 MED ORDER — SUGAMMADEX SODIUM 200 MG/2ML IV SOLN
INTRAVENOUS | Status: DC | PRN
  Administered 2023-10-21: 50 mg via INTRAVENOUS
  Administered 2023-10-21: 110 mg via INTRAVENOUS

## 2023-10-21 MED ORDER — STERILE WATER FOR IRRIGATION IR SOLN
Status: DC | PRN
  Administered 2023-10-21: 1000 mL

## 2023-10-21 MED ORDER — FENTANYL CITRATE (PF) 250 MCG/5ML IJ SOLN
INTRAMUSCULAR | Status: AC
  Filled 2023-10-21: qty 5

## 2023-10-21 MED ORDER — PROPOFOL 10 MG/ML IV BOLUS
INTRAVENOUS | Status: AC
  Filled 2023-10-21: qty 20

## 2023-10-21 MED ORDER — CEFAZOLIN SODIUM-DEXTROSE 2-4 GM/100ML-% IV SOLN
INTRAVENOUS | Status: AC
  Filled 2023-10-21: qty 100

## 2023-10-21 MED ORDER — MIDAZOLAM HCL 2 MG/2ML IJ SOLN
INTRAMUSCULAR | Status: DC | PRN
  Administered 2023-10-21: 2 mg via INTRAVENOUS

## 2023-10-21 MED ORDER — DEXAMETHASONE SODIUM PHOSPHATE 10 MG/ML IJ SOLN
INTRAMUSCULAR | Status: AC
  Filled 2023-10-21: qty 1

## 2023-10-21 MED ORDER — ACETAMINOPHEN 325 MG PO TABS: 650.0000 mg | ORAL_TABLET | Freq: Four times a day (QID) | ORAL | 2 refills | Status: AC | PRN

## 2023-10-21 MED ORDER — KETOROLAC TROMETHAMINE 30 MG/ML IJ SOLN
INTRAMUSCULAR | Status: AC
  Filled 2023-10-21: qty 1

## 2023-10-21 MED ORDER — ROCURONIUM BROMIDE 10 MG/ML (PF) SYRINGE
PREFILLED_SYRINGE | INTRAVENOUS | Status: AC
  Filled 2023-10-21: qty 10

## 2023-10-21 MED ORDER — CHLORHEXIDINE GLUCONATE 0.12 % MT SOLN
15.0000 mL | Freq: Once | OROMUCOSAL | Status: AC
  Administered 2023-10-21: 15 mL via OROMUCOSAL
  Filled 2023-10-21: qty 15

## 2023-10-21 MED ORDER — PHENYLEPHRINE 80 MCG/ML (10ML) SYRINGE FOR IV PUSH (FOR BLOOD PRESSURE SUPPORT)
PREFILLED_SYRINGE | INTRAVENOUS | Status: DC | PRN
Start: 2023-10-21 — End: 2023-10-21
  Administered 2023-10-21 (×2): 160 ug via INTRAVENOUS
  Administered 2023-10-21 (×3): 80 ug via INTRAVENOUS
  Administered 2023-10-21: 160 ug via INTRAVENOUS

## 2023-10-21 MED ORDER — HYDROMORPHONE HCL 1 MG/ML IJ SOLN: 0.2500 mg | INTRAMUSCULAR | Status: DC | PRN

## 2023-10-21 MED ORDER — 0.9 % SODIUM CHLORIDE (POUR BTL) OPTIME
TOPICAL | Status: DC | PRN
  Administered 2023-10-21: 1000 mL

## 2023-10-21 MED ORDER — DEXAMETHASONE SODIUM PHOSPHATE 10 MG/ML IJ SOLN
INTRAMUSCULAR | Status: DC | PRN
  Administered 2023-10-21: 10 mg via INTRAVENOUS

## 2023-10-21 MED ORDER — BUPIVACAINE HCL (PF) 0.25 % IJ SOLN
INTRAMUSCULAR | Status: AC
  Filled 2023-10-21: qty 30

## 2023-10-21 MED ORDER — OXYCODONE HCL 5 MG/5ML PO SOLN: 5.0000 mg | Freq: Once | ORAL | Status: DC | PRN

## 2023-10-21 MED ORDER — BUPIVACAINE HCL 0.25 % IJ SOLN
INTRAMUSCULAR | Status: DC | PRN
  Administered 2023-10-21: 30 mL

## 2023-10-21 MED ORDER — PHENYLEPHRINE 80 MCG/ML (10ML) SYRINGE FOR IV PUSH (FOR BLOOD PRESSURE SUPPORT)
PREFILLED_SYRINGE | INTRAVENOUS | Status: AC
  Filled 2023-10-21: qty 10

## 2023-10-21 MED ORDER — ONDANSETRON HCL 4 MG/2ML IJ SOLN
INTRAMUSCULAR | Status: DC | PRN
  Administered 2023-10-21: 4 mg via INTRAVENOUS

## 2023-10-21 SURGICAL SUPPLY — 51 items
BAG COUNTER SPONGE SURGICOUNT (BAG) ×2 IMPLANT
BENZOIN TINCTURE PRP APPL 2/3 (GAUZE/BANDAGES/DRESSINGS) IMPLANT
BIT DRILL 2 CANN GRADUATED (BIT) IMPLANT
BIT DRILL 2.5 CANN ENDOSCOPIC (BIT) IMPLANT
BLADE KNIFE PERSONA 15 (BLADE) IMPLANT
BNDG COHESIVE 4X5 TAN STRL LF (GAUZE/BANDAGES/DRESSINGS) ×2 IMPLANT
BRUSH SCRUB EZ 4% CHG (MISCELLANEOUS) ×2 IMPLANT
CHLORAPREP W/TINT 26 (MISCELLANEOUS) ×2 IMPLANT
COVER MAYO STAND STRL (DRAPES) ×2 IMPLANT
COVER SURGICAL LIGHT HANDLE (MISCELLANEOUS) ×2 IMPLANT
DRAPE C-ARM 42X120 X-RAY (DRAPES) ×2 IMPLANT
DRAPE INCISE IOBAN 66X45 STRL (DRAPES) ×2 IMPLANT
DRAPE SURG ORHT 6 SPLT 77X108 (DRAPES) ×4 IMPLANT
DRAPE U-SHAPE 47X51 STRL (DRAPES) ×4 IMPLANT
DRSG TEGADERM 4X4.75 (GAUZE/BANDAGES/DRESSINGS) IMPLANT
DRSG XEROFORM 1X8 (GAUZE/BANDAGES/DRESSINGS) IMPLANT
ELECT COATED BLADE 2.86 ST (ELECTRODE) IMPLANT
GAUZE SPONGE 4X4 12PLY STRL (GAUZE/BANDAGES/DRESSINGS) IMPLANT
GLOVE BIO SURGEON STRL SZ8 (GLOVE) ×2 IMPLANT
GLOVE BIOGEL PI IND STRL 8 (GLOVE) ×2 IMPLANT
GOWN SRG 2XL LVL 4 BRTHBL STRL (GOWNS) IMPLANT
GOWN STRL REUS W/ TWL LRG LVL3 (GOWN DISPOSABLE) ×2 IMPLANT
GOWN STRL REUS W/ TWL XL LVL3 (GOWN DISPOSABLE) ×2 IMPLANT
GRAFT BNE CANC CHIPS 1-8 20CC (Bone Implant) IMPLANT
KIT BASIN OR (CUSTOM PROCEDURE TRAY) ×2 IMPLANT
KIT TURNOVER KIT A (KITS) ×2 IMPLANT
LAVAGE JET IRRISEPT WOUND (IRRIGATION / IRRIGATOR) ×2 IMPLANT
MANIFOLD NEPTUNE II (INSTRUMENTS) ×2 IMPLANT
NDL HYPO 22X1.5 SAFETY MO (MISCELLANEOUS) IMPLANT
NEEDLE HYPO 22X1.5 SAFETY MO (MISCELLANEOUS) ×1 IMPLANT
NS IRRIG 1000ML POUR BTL (IV SOLUTION) ×2 IMPLANT
PACK SHOULDER (CUSTOM PROCEDURE TRAY) ×2 IMPLANT
PAD ARMBOARD POSITIONER FOAM (MISCELLANEOUS) ×4 IMPLANT
PENCIL BUTTON HOLSTER BLD 10FT (ELECTRODE) IMPLANT
PLATE DISTAL CLAVICLE ING LFT (Plate) IMPLANT
RESTRAINT HEAD UNIVERSAL NS (MISCELLANEOUS) ×2 IMPLANT
SCREW COMP KREULOCK 2.7X14 (Screw) IMPLANT
SCREW COMP KREULOCK 2.7X16 (Screw) IMPLANT
SCREW COMP KREULOCK 2.7X18 (Screw) IMPLANT
SCREW NON-LOCKING 3.5X12MM (Screw) IMPLANT
SLING ARM FOAM STRAP LRG (SOFTGOODS) IMPLANT
SOLN STERILE WATER 1000 ML (IV SOLUTION) ×1 IMPLANT
SOLN STERILE WATER BTL 1000 ML (IV SOLUTION) ×2 IMPLANT
STOCKINETTE IMPERVIOUS 9X36 MD (GAUZE/BANDAGES/DRESSINGS) ×2 IMPLANT
STRIP CLOSURE SKIN 1/2X4 (GAUZE/BANDAGES/DRESSINGS) IMPLANT
SUT ETHIBOND 5 LR DA (SUTURE) ×2 IMPLANT
SUT MON AB 2-0 SH 27 (SUTURE) IMPLANT
SUT MON AB 2-0 SH27 (SUTURE) IMPLANT
SUT MON AB 4-0 PS1 27 (SUTURE) IMPLANT
SUT VIC AB 0 CT1 36 (SUTURE) IMPLANT
SUTURE FIBERWR #5 38 CONV NDL (SUTURE) ×2 IMPLANT

## 2023-10-21 NOTE — Anesthesia Procedure Notes (Signed)
 Procedure Name: Intubation Date/Time: 10/21/2023 7:43 AM  Performed by: Julien Manus, CRNAPre-anesthesia Checklist: Patient identified, Emergency Drugs available, Suction available and Patient being monitored Patient Re-evaluated:Patient Re-evaluated prior to induction Oxygen Delivery Method: Circle System Utilized Preoxygenation: Pre-oxygenation with 100% oxygen Induction Type: IV induction Ventilation: Mask ventilation without difficulty Laryngoscope Size: Mac and 3 Grade View: Grade I Tube type: Oral Number of attempts: 1 Airway Equipment and Method: Stylet and Oral airway Placement Confirmation: ETT inserted through vocal cords under direct vision, positive ETCO2 and breath sounds checked- equal and bilateral Secured at: 22 cm Tube secured with: Tape Dental Injury: Teeth and Oropharynx as per pre-operative assessment

## 2023-10-21 NOTE — Progress Notes (Signed)
 Post op note  S: Patient reports pain is controlled with her oral regimen.  She has no numbness or tingling of the left upper extremity. No nausea or vomitting  O: Left upper extremity - dressing clean/dry/intact - tender to palpation of the left shoulder - motor intact m/r/u distribution - sensation intact to light touch ax/m/r/u distribution - 2+ radial pulse  A/P: 68 year old female POD 0 s/p ORIF of the left distal clavicle with coracoclavicular ligament repair.  Patient is doing well.  Pain is well controlled.  NWB LUE in sling Oxy and tylenol  for pain Docusate and senna for bowel regimen Ok to discharge home Regular diet RTC in 2 weeks for skin check and x-rays left shoulder

## 2023-10-21 NOTE — Op Note (Addendum)
 Date of Surgery: 10/21/2023  INDICATIONS: Ms. Resetar is a 68 y.o.-year-old female who sustained a left distal clavicle fracture with coracoclavicular ligament injury;  The patient did consent to the procedure after discussion of the risks and benefits.  PREOPERATIVE DIAGNOSIS: Left distal third clavicle fracture  POSTOPERATIVE DIAGNOSIS: Same.  PROCEDURE: Open treatment of left distal clavicle fracture with internal fixation, repair of the coracoclavicular ligaments  SURGEON: Rankin Pizza, M.D.  ASSIST: Magdalene Fireman.  ANESTHESIA:  general  IV FLUIDS AND URINE: See anesthesia.  ESTIMATED BLOOD LOSS: minimal mL.  IMPLANTS: Arthrex distal clavicle plate, 6.4ff cortical screws x 4, 2.7 mm locking screws x3. # 5 fiberwire x 2  COMPLICATIONS: None.  PROCEDURE: The patient was brought to the operating room and placed on the operating room table in the supine position. The patient underwent general anesthesia per anesthesia protocol. The patient was placed in a beach-chair position with all bony prominences well-padded. The operative arm was prepped and draped along with the area of the clavicle and anterior chest.  A timeout was then performed confirming patient's name, date of birth, informed consent present on the chart, laterality and procedure to be performed, that Ancef  was given preoperatively and that implant representatives were available.  All present agreed.  A transverse incision was made over the distal clavicle and acromioclavicular joint and brought down with sharp and blunt dissection to the distal clavicle.  Care was taken during this time to protect any nerves present in the surgical field.  The fracture was identified and fracture fragments were debrided of hematoma. Dissection was then carried down bluntly to the coracoid neck and then carefully intervals were developed medial and lateral to the coracoid neck.  During this approach care was also taken to protect the medial  neurovascular structures.  Using a curved tonsil, a # 5 ethibond was passed underneath the inferior aspect of the coracoid which was used to subsequently to shuttle two #5 fiberwire sutures with a limb of each suture passing anteriorly and posteriorly respectively.  The fracture was examined and reduced with reduction maintained to the combination of K wires and clamps. There was determined to be significant comminution and so the butterfly fragment was clamped and cerclaged using # 5 ethibond to assist in reduction. Biplanar fluoroscopy was utilized at this time to confirm the reduction. The passed fiberwire sutures were then tied over the clavicle to reconstruct the coracoclavicular ligaments and re approximated their bony attachment by tying the fiberwire over the top of the clavicle in a cross-fashion taking care to move the knots as anteriorly and inferiorly as possible. This nicely reduced the coracoclavicular  interval and restored the acromioclavicular alignment for the acromioclavicular reconstruction. At this point, a distal clavicle plate was now placed over the distal clavicle and it was fixed with three medial locking screws. Multiple lateral cortical screws were drilled eccentrically to produce compression. During screw placement, care was taken to protect the structures inferior to the clavicle. The position of the plate and screws and fracture were confirmed again using intraoperative biplanar fluoroscopy at the clavicle and found to be in excellent position. There was some residual bone loss and the decision was made to bone graft this void with cancelous chips. Final biplanar fluoroscopy was obtained confirming satisfactory reduction and hardware positioning    The wound was irrigated with irrisept and copious saline. The trapezius was repaired to the pectoralis over the top of the clavicle using 0 vicryl suture. After further irrigation, the wound was  closed 2-0 monocryl buried subcutaneous and  4-0 Monocryl running subcuticular stitch.  25 cc of 0.25% marcaine  plane was infiltrated into the subcutaneous tissue for local anesthesia. The wound was dressed using steris,  Xeroform, 4 x 4, Tegaderm and the arm was placed in a sling. The patient tolerated the procedure well.  Patient was awakened per anesthesia protocol, transferred to a stretcher and taken to the recovery room in stable condition.  POST OP PROTOCOL Patient is to be nonweightbearing to the left extremity in the sling. She will return to clinic in 2 weeks postoperatively for skin check and repeat x-rays.  Rankin Pizza, MD

## 2023-10-21 NOTE — Discharge Instructions (Addendum)
 Orthopedic surgery discharge instructions:  Dressings:  -Maintain postoperative bandage until your follow-up appointment.    Shower: This bandage is waterproof and you may shower and that bandage beginning on postoperative day #2.  Do not get the bandages. Please do not submerge the shoulder underwater.  Activity restrictions: -Maintain your sling at all times unless performing activities of daily living or desk work.   - No lifting above shoulder height.   - You may move the elbow, hand and wrist as tolerated and you may do shoulder pendulums, letting the arm dangle at your side.  Again and you are free to use the non surgical arm for activities of daily living such as eating, getting dressed, and bathing and doing work.   Medications: -For mild to moderate pain you should use Tylenol  as your first line as prescribed.  Do this throughout the day and utilize Oxycodone  as needed for breakthrough pain. you should try to wean off of the oxycodone  codone over the next 3-5 days.  Follow Up: -You will return to see Dr. Teresa in the office in 2 weeks for postoperative x-rays and wound check.   Additional:  -Apply ice to the operative shoulder along the surgical bandage for 20 to 30 minutes out of each hour.  Do this as frequently as possible throughout the day.

## 2023-10-21 NOTE — Anesthesia Postprocedure Evaluation (Signed)
 Anesthesia Post Note  Patient: April Stevens  Procedure(s) Performed: OPEN REDUCTION INTERNAL FIXATION (ORIF) CLAVICULAR FRACTURE (Left: Shoulder)     Patient location during evaluation: PACU Anesthesia Type: General Level of consciousness: awake and alert Pain management: pain level controlled Vital Signs Assessment: post-procedure vital signs reviewed and stable Respiratory status: spontaneous breathing, nonlabored ventilation and respiratory function stable Cardiovascular status: blood pressure returned to baseline and stable Postop Assessment: no apparent nausea or vomiting Anesthetic complications: no   No notable events documented.  Last Vitals:  Vitals:   10/21/23 1200 10/21/23 1201  BP: (!) 147/76   Pulse: 92 92  Resp: 13 17  Temp:  37.2 C  SpO2: 95% 96%    Last Pain:  Vitals:   10/21/23 1201  TempSrc:   PainSc: 0-No pain                 Butler Levander Pinal

## 2023-10-21 NOTE — Transfer of Care (Signed)
 Immediate Anesthesia Transfer of Care Note  Patient: April Stevens  Procedure(s) Performed: OPEN REDUCTION INTERNAL FIXATION (ORIF) CLAVICULAR FRACTURE (Left: Shoulder)  Patient Location: PACU  Anesthesia Type:General  Level of Consciousness: drowsy  Airway & Oxygen Therapy: Patient Spontanous Breathing and Patient connected to face mask oxygen  Post-op Assessment: Report given to RN and Post -op Vital signs reviewed and stable  Post vital signs: Reviewed and stable  Last Vitals:  Vitals Value Taken Time  BP 138/63 10/21/23 11:15  Temp 36.9 C 10/21/23 11:15  Pulse 94 10/21/23 11:18  Resp 12 10/21/23 11:18  SpO2 89 % 10/21/23 11:18  Vitals shown include unfiled device data.  Last Pain:  Vitals:   10/21/23 0656  TempSrc:   PainSc: 3       Patients Stated Pain Goal: 4 (10/21/23 0656)  Complications: No notable events documented.

## 2023-10-24 ENCOUNTER — Encounter (HOSPITAL_COMMUNITY): Payer: Self-pay

## 2023-11-04 DIAGNOSIS — S42033A Displaced fracture of lateral end of unspecified clavicle, initial encounter for closed fracture: Secondary | ICD-10-CM | POA: Insufficient documentation

## 2023-12-19 ENCOUNTER — Ambulatory Visit: Admitting: Adult Health

## 2023-12-19 ENCOUNTER — Encounter: Payer: Self-pay | Admitting: Adult Health

## 2023-12-19 VITALS — BP 166/86 | HR 85 | Ht 61.0 in | Wt 188.8 lb

## 2023-12-19 DIAGNOSIS — G4733 Obstructive sleep apnea (adult) (pediatric): Secondary | ICD-10-CM

## 2023-12-19 NOTE — Progress Notes (Signed)
 April Stevens

## 2023-12-19 NOTE — Patient Instructions (Signed)
 Continue using CPAP nightly and greater than 4 hours each night Home sleep test ordered I increased your humidification to 4 cm of water  If your symptoms worsen or you develop new symptoms please let us  know.

## 2023-12-19 NOTE — Progress Notes (Signed)
 PATIENT: April Stevens DOB: 15-Jul-1955  REASON FOR VISIT: follow up HISTORY FROM: patient PRIMARY NEUROLOGIST: Dr. Chalice  Chief Complaint  Patient presents with   RM 4     Patient is here for CPAP follow up - has been having issues with her machine would like to discuss getting a new one      HISTORY OF PRESENT ILLNESS: Today 12/19/23:  April Stevens is a 68 y.o. female with a history of [name]. Returns today for follow-up.  She reports that she has had her machine for quite some time now.  She would like a new machine.  She does not feel that hers is working correctly.  She broke her collarbone and had surgery October 3.  She states that she has noted she has been waking up more at night.  Her CPAP report is below      05/10/22: April Stevens is a 68 y.o. female with a history of [name]. Returns today for follow-up.  Reports that the CPAP is working well.  She states that she is trying to lose weight in the hopes of getting off of the CPAP eventually.  She is seeing a weight loss specialist in May.       05/06/21: April Stevens is a 68 year old female with a history of obstructive sleep apnea on CPAP.  She returns today for follow-up.  She reports that the CPAP is working well.  Her only issue is that she is having to pay for CPAP supplies.   REVIEW OF SYSTEMS: Out of a complete 14 system review of symptoms, the patient complains only of the following symptoms, and all other reviewed systems are negative.  FSS 10 ESS 6  ALLERGIES: No Known Allergies  HOME MEDICATIONS: Outpatient Medications Prior to Visit  Medication Sig Dispense Refill   acetaminophen  (TYLENOL ) 325 MG tablet Take 2 tablets (650 mg total) by mouth every 6 (six) hours as needed. 100 tablet 2   aspirin  81 MG tablet You can restart your baby aspirin  1 daily 09/28/2018. (Patient taking differently: Take 81 mg by mouth at bedtime.) 365 tablet 0   chlorthalidone  (HYGROTON ) 25 MG tablet Take 25 mg by  mouth daily.     Cyanocobalamin  (B-12 PO) Take 1 tablet by mouth daily at 2 PM.     potassium chloride  SA (KLOR-CON  M) 20 MEQ tablet Take 20 mEq by mouth daily.     albuterol  (VENTOLIN  HFA) 108 (90 Base) MCG/ACT inhaler Inhale 2 puffs into the lungs every 6 (six) hours as needed for wheezing or shortness of breath. (Patient not taking: Reported on 12/19/2023) 8 g 2   FLUoxetine  (PROZAC ) 20 MG capsule Take 3 capsules (60 mg total) by mouth daily. (Patient not taking: Reported on 12/19/2023) 270 capsule 1   guaiFENesin  (MUCINEX ) 600 MG 12 hr tablet Take 1 tablet (600 mg total) by mouth 2 (two) times daily as needed for cough or to loosen phlegm. (Patient not taking: Reported on 12/19/2023) 30 tablet 0   meloxicam  (MOBIC ) 7.5 MG tablet Take 1 tablet by mouth daily as needed for pain. (Patient not taking: Reported on 10/21/2023)     methylPREDNISolone  (MEDROL  DOSEPAK) 4 MG TBPK tablet See pack instructions (Patient not taking: Reported on 12/19/2023) 21 each 0   oxybutynin  (DITROPAN -XL) 10 MG 24 hr tablet Take 10 mg by mouth daily. (Patient not taking: Reported on 12/19/2023)     phentermine  (ADIPEX-P ) 37.5 MG tablet Take 1 tablet (37.5  mg total) by mouth daily 30 minutes before breakfast. (Patient not taking: Reported on 12/19/2023) 15 tablet 0   Semaglutide -Weight Management 1 MG/0.5ML SOAJ Inject 1 mg as directed once a week. (Patient not taking: Reported on 12/19/2023) 2 mL 0   WEGOVY  0.25 MG/0.5ML SOAJ Inject 0.25 mg into the skin once a week at 0900 for 4 doses (Patient not taking: Reported on 12/19/2023) 2 mL 0   WEGOVY  0.25 MG/0.5ML SOAJ Inject 0.25 mg into the skin once a week at 9 am (Patient not taking: Reported on 12/19/2023) 2 mL 0   WEGOVY  0.25 MG/0.5ML SOAJ Inject 0.25 mg into the skin once a week. (Patient not taking: Reported on 12/19/2023) 2 mL 0   No facility-administered medications prior to visit.    PAST MEDICAL HISTORY: Past Medical History:  Diagnosis Date   Depression    Hypertension     [name]     PAST SURGICAL HISTORY: Past Surgical History:  Procedure Laterality Date   APPENDECTOMY  09/26/2018   HYSTERECTOMY ABDOMINAL WITH SALPINGO-OOPHORECTOMY  2000   DUB   LAPAROSCOPIC APPENDECTOMY N/A 09/26/2018   Procedure: APPENDECTOMY LAPAROSCOPIC;  Surgeon: Kinsinger, Herlene Righter, MD;  Location: [facility] OR;  Service: General;  Laterality: N/A;   OPEN REDUCTION INTERNAL FIXATION (ORIF) DISTAL RADIAL FRACTURE Left 08/17/2021   Procedure: OPEN REDUCTION INTERNAL FIXATION (ORIF) LEFT DISTAL RADIAL FRACTURE;  Surgeon: Romona Harari, MD;  Location: [facility];  Service: Orthopedics;  Laterality: Left;   ORIF CLAVICULAR FRACTURE Left 10/21/2023   Procedure: OPEN REDUCTION INTERNAL FIXATION (ORIF) CLAVICULAR FRACTURE;  Surgeon: Teresa Rankin ORN, MD;  Location: [facility] OR;  Service: Orthopedics;  Laterality: Left;   SHOULDER SURGERY Left    01-02-2018, has had R shoulder rotator cuff previously   TONSILLECTOMY      FAMILY HISTORY: Family History  Problem Relation Age of Onset   Hypertension Father    Heart attack Father    Sleep apnea Neg Hx     SOCIAL HISTORY: Social History   Socioeconomic History   Marital status: Single    Spouse name: Not on file   Number of children: Not on file   Years of education: Not on file   Highest education level: Not on file  Occupational History   Not on file  Tobacco Use   Smoking status: Former    Current packs/day: 0.00    Average packs/day: 3.0 packs/day for 20.0 years (60.0 ttl pk-yrs)    Types: Cigarettes    Start date: 31    Quit date: 2000    Years since quitting: 25.9   Smokeless tobacco: Never  Vaping Use   Vaping status: Never Used  Substance and Sexual Activity   Alcohol use: Yes    Comment: socially   Drug use: No   Sexual activity: Yes  Other Topics Concern   Not on file  Social History Narrative   Not on file   Social Drivers of Health   Financial Resource Strain: Low Risk  (09/21/2022)    Received from [facility]   Overall Financial Resource Strain (CARDIA)    Difficulty of Paying Living Expenses: Not hard at all  Food Insecurity: No Food Insecurity (09/21/2022)   Received from [facility]   Hunger Vital Sign    Within the past 12 months, you worried that your food would run out before you got the money to buy more.: Never true    Within the past 12 months, the food you bought  just didn't last and you didn't have money to get more.: Never true  Transportation Needs: No Transportation Needs (09/21/2022)   Received from [facility]   PRAPARE - Transportation    Lack of Transportation (Medical): No    Lack of Transportation (Non-Medical): No  Physical Activity: Sufficiently Active (09/21/2022)   Received from [facility]   Exercise Vital Sign    On average, how many days per week do you engage in moderate to strenuous exercise (like a brisk walk)?: 7 days    On average, how many minutes do you engage in exercise at this level?: 30 min  Stress: No Stress Concern Present (09/21/2022)   Received from [facility] - Occupational Stress Questionnaire    Feeling of Stress : Only a little  Social Connections: Moderately Integrated (09/21/2022)   Received from [facility]   Social Network    How would you rate your social network (family, work, friends)?: Adequate participation with social networks  Intimate Partner Violence: Not At Risk (09/21/2022)   Received from [facility]   HITS    Over the last 12 months how often did your partner physically hurt you?: Never    Over the last 12 months how often did your partner insult you or talk down to you?: Never    Over the last 12 months how often did your partner threaten you with physical harm?: Never    Over the last 12 months how often did your partner scream or curse at you?: Never      PHYSICAL EXAM  Vitals:   12/19/23 1146  BP: (!) 166/86  Pulse: 85  Weight: 188 lb 12.8 oz  (85.6 kg)  Height: 5' 1 (1.549 m)     Body mass index is 35.67 kg/m.  Generalized: Well developed, in no acute distress  Chest: Lungs clear to auscultation bilaterally  Neurological examination  Mentation: Alert oriented to time, place, history taking. Follows all commands speech and language fluent Cranial nerve II-XII: Facial symmetry noted   DIAGNOSTIC DATA (LABS, IMAGING, TESTING) - I reviewed patient records, labs, notes, testing and imaging myself where available.  Lab Results  Component Value Date   WBC 10.3 10/12/2023   HGB 12.6 10/21/2023   HCT 37.0 10/21/2023   MCV 86.8 10/12/2023   PLT 325 10/12/2023      Component Value Date/Time   NA 139 10/21/2023 0621   NA 139 06/29/2019 1157   K 3.3 (L) 10/21/2023 0621   CL 103 10/21/2023 0621   CO2 23 10/12/2023 2052   GLUCOSE 131 (H) 10/21/2023 0621   BUN 12 10/21/2023 0621   BUN 12 06/29/2019 1157   CREATININE 0.80 10/21/2023 0621   CREATININE 0.89 03/14/2014 1146   CALCIUM  9.8 10/12/2023 2052   PROT 7.5 10/12/2023 2052   PROT 7.2 06/29/2019 1157   ALBUMIN 4.4 10/12/2023 2052   ALBUMIN 4.3 06/29/2019 1157   AST 21 10/12/2023 2052   ALT 10 10/12/2023 2052   ALKPHOS 97 10/12/2023 2052   BILITOT 0.7 10/12/2023 2052   BILITOT 0.7 06/29/2019 1157   GFRNONAA >60 10/12/2023 2052   GFRNONAA 71 03/14/2014 1146   GFRAA 78 06/29/2019 1157   GFRAA 81 03/14/2014 1146   Lab Results  Component Value Date   CHOL 205 (H) 06/29/2019   HDL 45 06/29/2019   LDLCALC 110 (H) 06/29/2019   TRIG 292 (H) 06/29/2019   CHOLHDL 4.6 (H) 06/29/2019   Lab Results  Component Value Date   HGBA1C 6.1 (H) 03/27/2019   No results found for: VITAMINB12 Lab Results  Component Value Date   TSH 2.170 03/27/2019      ASSESSMENT AND PLAN 68 y.o. year old female  has a past medical history of Depression, Hypertension, and [name]. here with:  [name]  - CPAP compliance excellent - Good treatment of AHI  - Encourage  patient to use CPAP nightly and > 4 hours each night -I increased her humidification to 5 on her machine -Home sleep test ordered.  Pending sleep results will order new machine - F/U after home sleep test  Orders Placed This Encounter  Procedures   Home sleep test    Standing Status:   Future    Expiration Date:   12/18/2024    Where should this test be performed::   [facility]       Duwaine Russell, MSN, NP-C 12/19/2023, 11:45 AM [facility] [address], [address] [place], [place] [address] [phone]  The patient's condition requires frequent monitoring and adjustments in the treatment plan, reflecting the ongoing complexity of care.  This provider is the continuing focal point for all needed services for this condition.

## 2024-01-23 ENCOUNTER — Telehealth: Payer: Self-pay | Admitting: Adult Health

## 2024-01-23 ENCOUNTER — Other Ambulatory Visit (HOSPITAL_BASED_OUTPATIENT_CLINIC_OR_DEPARTMENT_OTHER): Payer: Self-pay

## 2024-01-23 MED ORDER — OZEMPIC (0.25 OR 0.5 MG/DOSE) 2 MG/3ML ~~LOC~~ SOPN
0.2500 mg | PEN_INJECTOR | SUBCUTANEOUS | 0 refills | Status: AC
  Filled 2024-01-23: qty 3, 28d supply, fill #0

## 2024-01-23 NOTE — Telephone Encounter (Signed)
 Patient LVM at 1:30 pm someone was suppose to contact me to schedule another sleep study done. No one has called me. Transferred call to [facility]

## 2024-01-24 ENCOUNTER — Other Ambulatory Visit (HOSPITAL_BASED_OUTPATIENT_CLINIC_OR_DEPARTMENT_OTHER): Payer: Self-pay

## 2024-01-24 MED ORDER — ROSUVASTATIN CALCIUM 10 MG PO TABS
10.0000 mg | ORAL_TABLET | Freq: Every evening | ORAL | 11 refills | Status: AC
  Filled 2024-01-24: qty 30, 30d supply, fill #0

## 2024-01-26 ENCOUNTER — Other Ambulatory Visit (HOSPITAL_BASED_OUTPATIENT_CLINIC_OR_DEPARTMENT_OTHER): Payer: Self-pay

## 2024-01-27 ENCOUNTER — Other Ambulatory Visit (HOSPITAL_BASED_OUTPATIENT_CLINIC_OR_DEPARTMENT_OTHER): Payer: Self-pay

## 2024-02-01 ENCOUNTER — Other Ambulatory Visit (HOSPITAL_BASED_OUTPATIENT_CLINIC_OR_DEPARTMENT_OTHER): Payer: Self-pay

## 2024-02-04 ENCOUNTER — Other Ambulatory Visit (HOSPITAL_BASED_OUTPATIENT_CLINIC_OR_DEPARTMENT_OTHER): Payer: Self-pay

## 2024-02-06 ENCOUNTER — Other Ambulatory Visit (HOSPITAL_BASED_OUTPATIENT_CLINIC_OR_DEPARTMENT_OTHER): Payer: Self-pay

## 2024-02-15 ENCOUNTER — Encounter

## 2024-02-22 ENCOUNTER — Ambulatory Visit: Payer: Self-pay | Admitting: Neurology

## 2024-02-22 DIAGNOSIS — G4733 Obstructive sleep apnea (adult) (pediatric): Secondary | ICD-10-CM

## 2024-02-23 NOTE — Progress Notes (Unsigned)
 April Stevens
